# Patient Record
Sex: Female | Born: 1985 | Race: Black or African American | Hispanic: No | Marital: Single | Smoking: Never smoker
Health system: Southern US, Community
[De-identification: ages and names within clinical notes are randomized; demographics above are authoritative.]

## PROBLEM LIST (undated history)

## (undated) DIAGNOSIS — E669 Obesity, unspecified: Secondary | ICD-10-CM

## (undated) DIAGNOSIS — D649 Anemia, unspecified: Secondary | ICD-10-CM

## (undated) DIAGNOSIS — Q743 Arthrogryposis multiplex congenita: Secondary | ICD-10-CM

## (undated) DIAGNOSIS — B009 Herpesviral infection, unspecified: Secondary | ICD-10-CM

## (undated) DIAGNOSIS — E119 Type 2 diabetes mellitus without complications: Secondary | ICD-10-CM

## (undated) DIAGNOSIS — I82812 Embolism and thrombosis of superficial veins of left lower extremities: Secondary | ICD-10-CM

## (undated) DIAGNOSIS — E785 Hyperlipidemia, unspecified: Secondary | ICD-10-CM

## (undated) DIAGNOSIS — R5383 Other fatigue: Secondary | ICD-10-CM

## (undated) DIAGNOSIS — Z86018 Personal history of other benign neoplasm: Secondary | ICD-10-CM

## (undated) DIAGNOSIS — I10 Essential (primary) hypertension: Secondary | ICD-10-CM

## (undated) HISTORY — DX: Type 2 diabetes mellitus without complications: E11.9

## (undated) HISTORY — DX: Other fatigue: R53.83

## (undated) HISTORY — PX: OTHER SURGICAL HISTORY: SHX169

## (undated) HISTORY — DX: Obesity, unspecified: E66.9

## (undated) HISTORY — DX: Personal history of other benign neoplasm: Z86.018

## (undated) HISTORY — DX: Herpesviral infection, unspecified: B00.9

## (undated) HISTORY — DX: Anemia, unspecified: D64.9

## (undated) HISTORY — DX: Hyperlipidemia, unspecified: E78.5

## (undated) HISTORY — DX: Arthrogryposis multiplex congenita: Q74.3

## (undated) HISTORY — DX: Embolism and thrombosis of superficial veins of left lower extremity: I82.812

## (undated) HISTORY — PX: UTERINE FIBROID SURGERY: SHX826

## (undated) HISTORY — DX: Essential (primary) hypertension: I10

---

## 2008-06-18 ENCOUNTER — Emergency Department: Payer: Self-pay | Admitting: Emergency Medicine

## 2008-06-20 ENCOUNTER — Emergency Department: Payer: Self-pay | Admitting: Emergency Medicine

## 2008-06-22 ENCOUNTER — Emergency Department: Payer: Self-pay | Admitting: Emergency Medicine

## 2008-09-30 ENCOUNTER — Emergency Department: Payer: Self-pay | Admitting: Emergency Medicine

## 2010-11-03 ENCOUNTER — Ambulatory Visit: Payer: Self-pay | Admitting: Internal Medicine

## 2010-11-26 ENCOUNTER — Ambulatory Visit: Payer: Self-pay | Admitting: Internal Medicine

## 2013-12-02 LAB — HM PAP SMEAR: HM PAP: NEGATIVE

## 2014-12-08 ENCOUNTER — Encounter: Payer: Self-pay | Admitting: Obstetrics and Gynecology

## 2015-01-13 ENCOUNTER — Encounter: Payer: Self-pay | Admitting: Obstetrics and Gynecology

## 2015-02-17 ENCOUNTER — Encounter: Payer: Self-pay | Admitting: Obstetrics and Gynecology

## 2015-04-28 ENCOUNTER — Other Ambulatory Visit
Admission: RE | Admit: 2015-04-28 | Discharge: 2015-04-28 | Disposition: A | Discharge status: Home / Self Care | Payer: Medicaid Other | Source: Ambulatory Visit | Attending: Nurse Practitioner | Admitting: Nurse Practitioner

## 2015-04-28 DIAGNOSIS — I10 Essential (primary) hypertension: Secondary | ICD-10-CM | POA: Insufficient documentation

## 2015-04-28 DIAGNOSIS — E119 Type 2 diabetes mellitus without complications: Secondary | ICD-10-CM | POA: Insufficient documentation

## 2015-04-28 DIAGNOSIS — R5383 Other fatigue: Secondary | ICD-10-CM | POA: Insufficient documentation

## 2015-04-28 DIAGNOSIS — D509 Iron deficiency anemia, unspecified: Secondary | ICD-10-CM | POA: Insufficient documentation

## 2015-04-28 DIAGNOSIS — E785 Hyperlipidemia, unspecified: Secondary | ICD-10-CM | POA: Insufficient documentation

## 2015-04-28 LAB — COMPREHENSIVE METABOLIC PANEL
ALK PHOS: 63 U/L (ref 38–126)
ALT: 17 U/L (ref 14–54)
AST: 17 U/L (ref 15–41)
Albumin: 4.3 g/dL (ref 3.5–5.0)
Anion gap: 9 (ref 5–15)
BUN: 8 mg/dL (ref 6–20)
CHLORIDE: 108 mmol/L (ref 101–111)
CO2: 23 mmol/L (ref 22–32)
Calcium: 9.4 mg/dL (ref 8.9–10.3)
Creatinine, Ser: 0.36 mg/dL — ABNORMAL LOW (ref 0.44–1.00)
GLUCOSE: 114 mg/dL — AB (ref 65–99)
POTASSIUM: 3.6 mmol/L (ref 3.5–5.1)
Sodium: 140 mmol/L (ref 135–145)
Total Bilirubin: 0.4 mg/dL (ref 0.3–1.2)
Total Protein: 8.5 g/dL — ABNORMAL HIGH (ref 6.5–8.1)

## 2015-04-28 LAB — LIPID PANEL
CHOL/HDL RATIO: 4 ratio
Cholesterol: 166 mg/dL (ref 0–200)
HDL: 41 mg/dL (ref >40)
LDL CALC: 116 mg/dL — AB (ref 0–99)
Triglycerides: 43 mg/dL (ref <150)
VLDL: 9 mg/dL (ref 0–40)

## 2015-04-28 LAB — CBC
HCT: 38.1 % (ref 35.0–47.0)
HEMOGLOBIN: 11.9 g/dL — AB (ref 12.0–16.0)
MCH: 24.4 pg — AB (ref 26.0–34.0)
MCHC: 31.3 g/dL — AB (ref 32.0–36.0)
MCV: 78.1 fL — AB (ref 80.0–100.0)
PLATELETS: 406 10*3/uL (ref 150–440)
RBC: 4.87 MIL/uL (ref 3.80–5.20)
RDW: 17.6 % — ABNORMAL HIGH (ref 11.5–14.5)
WBC: 7.1 10*3/uL (ref 3.6–11.0)

## 2015-04-28 LAB — TSH: TSH: 1.681 u[IU]/mL (ref 0.350–4.500)

## 2015-04-29 ENCOUNTER — Other Ambulatory Visit: Payer: Self-pay | Admitting: Nurse Practitioner

## 2015-07-14 ENCOUNTER — Encounter: Payer: Self-pay | Admitting: Obstetrics and Gynecology

## 2015-07-14 ENCOUNTER — Ambulatory Visit (INDEPENDENT_AMBULATORY_CARE_PROVIDER_SITE_OTHER): Payer: Medicaid Other | Admitting: Obstetrics and Gynecology

## 2015-07-14 VITALS — BP 189/100 | HR 80 | Ht <= 58 in | Wt 191.4 lb

## 2015-07-14 DIAGNOSIS — N940 Mittelschmerz: Secondary | ICD-10-CM

## 2015-07-14 DIAGNOSIS — Z30011 Encounter for initial prescription of contraceptive pills: Secondary | ICD-10-CM | POA: Diagnosis not present

## 2015-07-14 DIAGNOSIS — I10 Essential (primary) hypertension: Secondary | ICD-10-CM

## 2015-07-14 DIAGNOSIS — E119 Type 2 diabetes mellitus without complications: Secondary | ICD-10-CM

## 2015-07-14 DIAGNOSIS — Z01419 Encounter for gynecological examination (general) (routine) without abnormal findings: Secondary | ICD-10-CM | POA: Diagnosis not present

## 2015-07-14 DIAGNOSIS — N920 Excessive and frequent menstruation with regular cycle: Secondary | ICD-10-CM | POA: Diagnosis not present

## 2015-07-14 MED ORDER — NORETHINDRONE 0.35 MG PO TABS: 1.0000 | ORAL_TABLET | Freq: Every day | ORAL | Status: DC

## 2015-07-14 NOTE — Progress Notes (Signed)
GYNECOLOGY ANNUAL PHYSICAL EXAM PROGRESS NOTE  Subjective:    Robin Vazquez is a 30 y.o. G0P0 female who presents for an annual exam.  The patient has never been sexually active. The patient wears seatbelts: yes. The patient participates in regular exercise: no. Has the patient ever been transfused or tattooed?: no. The patient reports that there is not domestic violence in her life.    The patient has complaints today:  1) Pelvic pain ~ 1 week after her menstrual cycle ends, lasts 1-2 days, is sharp, and occasionally has light spotting associated.  Manages with Tylenol. 2) Heavy menstrual cycles - notes heavy periods since onset of menses.  Uses super pads.  Cycles last 7 days.  Notes that she has just dealt with it because she assumed it was normal.    Gynecologic History Menarche age: 57 Patient's last menstrual period was 06/20/2015. Contraception: abstinence History of STI's: None Last Pap: 11/2013. Results were: normal.  Denies h/o abnormal pap smears.    OB History  Gravida Para Term Preterm AB SAB TAB Ectopic Multiple Living  0 0 0 0 0 0 0 0 0 0         Past Medical History  Diagnosis Date  . Anemia   . Hypertension   . Hyperlipemia   . Diabetes mellitus without complication ([place])   . Congenital multiple arthrogryposis   . Obesity     Past Surgical History  Procedure Laterality Date  . Joint      on legs and feet    Family History  Problem Relation Age of Onset  . Adopted: Yes  . Family history unknown: Yes    Social History   Social History  . Marital Status: Single    Spouse Name: N/A  . Number of Children: N/A  . Years of Education: N/A   Occupational History  . Not on file.   Social History Main Topics  . Smoking status: Never Smoker   . Smokeless tobacco: Not on file  . Alcohol Use: No  . Drug Use: No  . Sexual Activity: No   Other Topics Concern  . Not on file   Social History Narrative    Current Outpatient Prescriptions on  File Prior to Visit  Medication Sig Dispense Refill  . enalapril (VASOTEC) 5 MG tablet Take 5 mg by mouth daily.    Marland Kitchen glipiZIDE (GLUCOTROL) 10 MG tablet Take 10 mg by mouth daily before breakfast.    . phentermine 37.5 MG capsule Take 37.5 mg by mouth every morning.     No current facility-administered medications on file prior to visit.    No Known Allergies    Review of Systems Constitutional: negative for chills, fatigue, fevers and sweats Eyes: negative for irritation, redness and visual disturbance Ears, nose, mouth, throat, and face: negative for hearing loss, nasal congestion, snoring and tinnitus Respiratory: negative for asthma, cough, sputum Cardiovascular: negative for chest pain, dyspnea, exertional chest pressure/discomfort, irregular heart beat, palpitations and syncope Gastrointestinal: negative for abdominal pain, change in bowel habits, nausea and vomiting Genitourinary: positive for heavy menstrual periods, pelvic pain mid cycle.  Negative for genital lesions, sexual problems and vaginal discharge, dysuria and urinary incontinence Integument/breast: negative for breast lump, breast tenderness and nipple discharge Hematologic/lymphatic: negative for bleeding and easy bruising Musculoskeletal: negative for back pain and muscle weakness Neurologic: negative for dizziness, headaches, vertigo and weakness Endocrine: negative for diabetic symptoms including polydipsia, polyuria and skin dryness Allergic/Immunologic: negative for hay fever and  urticaria      Objective:  Blood pressure 189/100, pulse 80, height 4\' 9"  (1.448 m), weight 191 lb 6.4 oz (86.818 kg), last menstrual period 06/20/2015. Body mass index is 41.41 kg/(m^2).  General Appearance:    Alert, cooperative, no distress, appears stated age  Head:    Normocephalic, without obvious abnormality, atraumatic  Eyes:    PERRL, conjunctiva/corneas clear, EOM's intact, both eyes  Ears:    Normal external ear canals,  both ears  Nose:   Nares normal, septum midline, mucosa normal, no drainage or sinus tenderness  Throat:   Lips, mucosa, and tongue normal; teeth and gums normal  Neck:   Supple, symmetrical, trachea midline, no adenopathy; thyroid: no enlargement/tenderness/nodules; no carotid bruit or JVD  Back:     Symmetric, no curvature, ROM normal, no CVA tenderness  Lungs:     Clear to auscultation bilaterally, respirations unlabored  Chest Wall:    No tenderness or deformity   Heart:    Regular rate and rhythm, S1 and S2 normal, no murmur, rub or gallop  Breast Exam:    No tenderness, masses, or nipple abnormality  Abdomen:     Soft, non-tender, bowel sounds active all four quadrants, no masses, no organomegaly.    Genitalia:  External genitalia Vulva:- Normal. Bartholin's Gland- Bilateral- Normal. Perineum- Normal. Clitoris- Normal. Introitus:Characteristics- Normal(However very narrow introitus with prominent pubic arch.). Discharge- None. Urethra:Characteristics- Normal. Speculum & Bimanual Vagina:No lesions. Vaginal Mucosa- No Atrophy, Relaxation or Secretions. Note: Vaginal mucosa difficult to assess as patient very tense during exam and use of pediatric speculum suboptimal against body habitus. Cervix:Characteristics- Normal(Cervix difficult to assess as patient very tense during exam and use of pediatric speculum suboptimal against body habitus.). Uterus:Characteristics- Normal size, shape and consistency (Uterus difficult to assess as patient very tense during exam and use of single digit bimanual exam with body habitus.). Position- Midposition. Adnexa:Characteristics- Bilateral- Normal. Note: Uterus difficult to assess as patient very tense during exam and use of single digit bimanual e  Rectal:    Normal external sphincter.  No hemorrhoids appreciated. Internal exam not done.   Extremities:   Joint contraction of knees and elbows bilaterally, shortened upper and lower  limbs. Atraumatic, no cyanosis or edema  Pulses:   2+ and symmetric all extremities  Skin:   Skin color, texture, turgor normal, several dry scaly patches of skin noted on lower extremities  Lymph nodes:   Cervical, supraclavicular, and axillary nodes normal  Neurologic:   CNII-XII intact, normal strength, sensation and reflexes throughout    Labs:  Lab Results  Component Value Date   WBC 7.1 04/28/2015   HGB 11.9* 04/28/2015   HCT 38.1 04/28/2015   MCV 78.1* 04/28/2015   PLT 406 04/28/2015    Lab Results  Component Value Date   CREATININE 0.36* 04/28/2015   BUN 8 04/28/2015   NA 140 04/28/2015   K 3.6 04/28/2015   CL 108 04/28/2015   CO2 23 04/28/2015    Lab Results  Component Value Date   ALT 17 04/28/2015   AST 17 04/28/2015   ALKPHOS 63 04/28/2015   BILITOT 0.4 04/28/2015    Lab Results  Component Value Date   TSH 1.681 04/28/2015    Results for SHANEECE, INSKO (MRN [id number]) as of 07/14/2015 19:40  Ref. Range 04/28/2015 12:37  Glucose Latest Ref Range: 65-99 mg/dL 114 (H)   Assessment:   Healthy female exam.   HTN DM Congenital arthogryposis Menorrhagia Mittelschmerz (pelvic pain)  Plan:     All questions answered. Breast self exam technique reviewed and patient encouraged to perform self-exam monthly. Discussed healthy lifestyle modifications. Pap smear up to date.  Next pap smear due in 11/2018.  HTN uncontrolled.  Patient notes that BPs are usually not this high at other clinic visits.  Was nervous about pelvic exam today.  Notes complaince with medications.  DM - currently controlled on medications (per patient).  Seen by PCP in December.  Contraception: patient not sexually active, however notes heavy menstrual cycles with Mittleschmerz pain.  Desires to try OCPs for management. Will prescribe progesterone-only OCPs due to patient's BP.   To return to clinic in 2 months to f/u contraception and management of menstrual cycles.    Rubie Maid,  MD [facility]

## 2015-07-14 NOTE — Patient Instructions (Signed)
Health Maintenance, Female Adopting a healthy lifestyle and getting preventive care can go a long way to promote health and wellness. Talk with your health care provider about what schedule of regular examinations is right for you. This is a good chance for you to check in with your provider about disease prevention and staying healthy. In between checkups, there are plenty of things you can do on your own. Experts have done a lot of research about which lifestyle changes and preventive measures are most likely to keep you healthy. Ask your health care provider for more information. WEIGHT AND DIET  Eat a healthy diet  Be sure to include plenty of vegetables, fruits, low-fat dairy products, and lean protein.  Do not eat a lot of foods high in solid fats, added sugars, or salt.  Get regular exercise. This is one of the most important things you can do for your health.  Most adults should exercise for at least 150 minutes each week. The exercise should increase your heart rate and make you sweat (moderate-intensity exercise).  Most adults should also do strengthening exercises at least twice a week. This is in addition to the moderate-intensity exercise.  Maintain a healthy weight  Body mass index (BMI) is a measurement that can be used to identify possible weight problems. It estimates body fat based on height and weight. Your health care provider can help determine your BMI and help you achieve or maintain a healthy weight.  For females 20 years of age and older:   A BMI below 18.5 is considered underweight.  A BMI of 18.5 to 24.9 is normal.  A BMI of 25 to 29.9 is considered overweight.  A BMI of 30 and above is considered obese.  Watch levels of cholesterol and blood lipids  You should start having your blood tested for lipids and cholesterol at 32 years of age, then have this test every 5 years.  You may need to have your cholesterol levels checked more often if:  Your lipid  or cholesterol levels are high.  You are older than 32 years of age.  You are at high risk for heart disease.  CANCER SCREENING   Lung Cancer  Lung cancer screening is recommended for adults 55-80 years old who are at high risk for lung cancer because of a history of smoking.  A yearly low-dose CT scan of the lungs is recommended for people who:  Currently smoke.  Have quit within the past 15 years.  Have at least a 30-pack-year history of smoking. A pack year is smoking an average of one pack of cigarettes a day for 1 year.  Yearly screening should continue until it has been 15 years since you quit.  Yearly screening should stop if you develop a health problem that would prevent you from having lung cancer treatment.  Breast Cancer  Practice breast self-awareness. This means understanding how your breasts normally appear and feel.  It also means doing regular breast self-exams. Let your health care provider know about any changes, no matter how small.  If you are in your 20s or 30s, you should have a clinical breast exam (CBE) by a health care provider every 1-3 years as part of a regular health exam.  If you are 40 or older, have a CBE every year. Also consider having a breast X-ray (mammogram) every year.  If you have a family history of breast cancer, talk to your health care provider about genetic screening.  If you   are at high risk for breast cancer, talk to your health care provider about having an MRI and a mammogram every year.  Breast cancer gene (BRCA) assessment is recommended for women who have family members with BRCA-related cancers. BRCA-related cancers include:  Breast.  Ovarian.  Tubal.  Peritoneal cancers.  Results of the assessment will determine the need for genetic counseling and BRCA1 and BRCA2 testing. Cervical Cancer Your health care provider may recommend that you be screened regularly for cancer of the pelvic organs (ovaries, uterus, and  vagina). This screening involves a pelvic examination, including checking for microscopic changes to the surface of your cervix (Pap test). You may be encouraged to have this screening done every 3 years, beginning at age 21.  For women ages 30-65, health care providers may recommend pelvic exams and Pap testing every 3 years, or they may recommend the Pap and pelvic exam, combined with testing for human papilloma virus (HPV), every 5 years. Some types of HPV increase your risk of cervical cancer. Testing for HPV may also be done on women of any age with unclear Pap test results.  Other health care providers may not recommend any screening for nonpregnant women who are considered low risk for pelvic cancer and who do not have symptoms. Ask your health care provider if a screening pelvic exam is right for you.  If you have had past treatment for cervical cancer or a condition that could lead to cancer, you need Pap tests and screening for cancer for at least 20 years after your treatment. If Pap tests have been discontinued, your risk factors (such as having a new sexual partner) need to be reassessed to determine if screening should resume. Some women have medical problems that increase the chance of getting cervical cancer. In these cases, your health care provider may recommend more frequent screening and Pap tests. Colorectal Cancer  This type of cancer can be detected and often prevented.  Routine colorectal cancer screening usually begins at 32 years of age and continues through 32 years of age.  Your health care provider may recommend screening at an earlier age if you have risk factors for colon cancer.  Your health care provider may also recommend using home test kits to check for hidden blood in the stool.  A small camera at the end of a tube can be used to examine your colon directly (sigmoidoscopy or colonoscopy). This is done to check for the earliest forms of colorectal  cancer.  Routine screening usually begins at age 50.  Direct examination of the colon should be repeated every 5-10 years through 32 years of age. However, you may need to be screened more often if early forms of precancerous polyps or small growths are found. Skin Cancer  Check your skin from head to toe regularly.  Tell your health care provider about any new moles or changes in moles, especially if there is a change in a mole's shape or color.  Also tell your health care provider if you have a mole that is larger than the size of a pencil eraser.  Always use sunscreen. Apply sunscreen liberally and repeatedly throughout the day.  Protect yourself by wearing long sleeves, pants, a wide-brimmed hat, and sunglasses whenever you are outside. HEART DISEASE, DIABETES, AND HIGH BLOOD PRESSURE   High blood pressure causes heart disease and increases the risk of stroke. High blood pressure is more likely to develop in:  People who have blood pressure in the high end   of the normal range (130-139/85-89 mm Hg).  People who are overweight or obese.  People who are African American.  If you are 38-23 years of age, have your blood pressure checked every 3-5 years. If you are 61 years of age or older, have your blood pressure checked every year. You should have your blood pressure measured twice--once when you are at a hospital or clinic, and once when you are not at a hospital or clinic. Record the average of the two measurements. To check your blood pressure when you are not at a hospital or clinic, you can use:  An automated blood pressure machine at a pharmacy.  A home blood pressure monitor.  If you are between 45 years and 39 years old, ask your health care provider if you should take aspirin to prevent strokes.  Have regular diabetes screenings. This involves taking a blood sample to check your fasting blood sugar level.  If you are at a normal weight and have a low risk for diabetes,  have this test once every three years after 32 years of age.  If you are overweight and have a high risk for diabetes, consider being tested at a younger age or more often. PREVENTING INFECTION  Hepatitis B  If you have a higher risk for hepatitis B, you should be screened for this virus. You are considered at high risk for hepatitis B if:  You were born in a country where hepatitis B is common. Ask your health care provider which countries are considered high risk.  Your parents were born in a high-risk country, and you have not been immunized against hepatitis B (hepatitis B vaccine).  You have HIV or AIDS.  You use needles to inject street drugs.  You live with someone who has hepatitis B.  You have had sex with someone who has hepatitis B.  You get hemodialysis treatment.  You take certain medicines for conditions, including cancer, organ transplantation, and autoimmune conditions. Hepatitis C  Blood testing is recommended for:  Everyone born from [birth year] through 1967.  Anyone with known risk factors for hepatitis C. Sexually transmitted infections (STIs)  You should be screened for sexually transmitted infections (STIs) including gonorrhea and chlamydia if:  You are sexually active and are younger than 32 years of age.  You are older than 32 years of age and your health care provider tells you that you are at risk for this type of infection.  Your sexual activity has changed since you were last screened and you are at an increased risk for chlamydia or gonorrhea. Ask your health care provider if you are at risk.  If you do not have HIV, but are at risk, it may be recommended that you take a prescription medicine daily to prevent HIV infection. This is called pre-exposure prophylaxis (PrEP). You are considered at risk if:  You are sexually active and do not regularly use condoms or know the HIV status of your partner(s).  You take drugs by injection.  You are sexually  active with a partner who has HIV. Talk with your health care provider about whether you are at high risk of being infected with HIV. If you choose to begin PrEP, you should first be tested for HIV. You should then be tested every 3 months for as long as you are taking PrEP.  PREGNANCY   If you are premenopausal and you may become pregnant, ask your health care provider about preconception counseling.  If you may  become pregnant, take 400 to 800 micrograms (mcg) of folic acid every day.  If you want to prevent pregnancy, talk to your health care provider about birth control (contraception). OSTEOPOROSIS AND MENOPAUSE   Osteoporosis is a disease in which the bones lose minerals and strength with aging. This can result in serious bone fractures. Your risk for osteoporosis can be identified using a bone density scan.  If you are 61 years of age or older, or if you are at risk for osteoporosis and fractures, ask your health care provider if you should be screened.  Ask your health care provider whether you should take a calcium or vitamin D supplement to lower your risk for osteoporosis.  Menopause may have certain physical symptoms and risks.  Hormone replacement therapy may reduce some of these symptoms and risks. Talk to your health care provider about whether hormone replacement therapy is right for you.  HOME CARE INSTRUCTIONS   Schedule regular health, dental, and eye exams.  Stay current with your immunizations.   Do not use any tobacco products including cigarettes, chewing tobacco, or electronic cigarettes.  If you are pregnant, do not drink alcohol.  If you are breastfeeding, limit how much and how often you drink alcohol.  Limit alcohol intake to no more than 1 drink per day for nonpregnant women. One drink equals 12 ounces of beer, 5 ounces of wine, or 1 ounces of hard liquor.  Do not use street drugs.  Do not share needles.  Ask your health care provider for help if  you need support or information about quitting drugs.  Tell your health care provider if you often feel depressed.  Tell your health care provider if you have ever been abused or do not feel safe at home.   This information is not intended to replace advice given to you by your health care provider. Make sure you discuss any questions you have with your health care provider.   Document Released: 11/27/2010 Document Revised: 06/04/2014 Document Reviewed: 04/15/2013 Elsevier Interactive Patient Education [employer].

## 2015-07-28 ENCOUNTER — Other Ambulatory Visit
Admission: RE | Admit: 2015-07-28 | Discharge: 2015-07-28 | Disposition: A | Discharge status: Home / Self Care | Payer: Medicaid Other | Source: Ambulatory Visit | Attending: Nurse Practitioner | Admitting: Nurse Practitioner

## 2015-07-28 DIAGNOSIS — E785 Hyperlipidemia, unspecified: Secondary | ICD-10-CM | POA: Diagnosis present

## 2015-07-28 DIAGNOSIS — E119 Type 2 diabetes mellitus without complications: Secondary | ICD-10-CM | POA: Diagnosis present

## 2015-07-28 DIAGNOSIS — I10 Essential (primary) hypertension: Secondary | ICD-10-CM | POA: Diagnosis present

## 2015-07-28 DIAGNOSIS — D509 Iron deficiency anemia, unspecified: Secondary | ICD-10-CM | POA: Diagnosis present

## 2015-07-28 LAB — COMPREHENSIVE METABOLIC PANEL
ALBUMIN: 4.4 g/dL (ref 3.5–5.0)
ALT: 15 U/L (ref 14–54)
AST: 16 U/L (ref 15–41)
Alkaline Phosphatase: 70 U/L (ref 38–126)
Anion gap: 8 (ref 5–15)
BILIRUBIN TOTAL: 0.4 mg/dL (ref 0.3–1.2)
BUN: 6 mg/dL (ref 6–20)
CHLORIDE: 106 mmol/L (ref 101–111)
CO2: 26 mmol/L (ref 22–32)
CREATININE: 0.42 mg/dL — AB (ref 0.44–1.00)
Calcium: 9.2 mg/dL (ref 8.9–10.3)
GFR calc Af Amer: >60 mL/min (ref >60)
GLUCOSE: 105 mg/dL — AB (ref 65–99)
Potassium: 3.7 mmol/L (ref 3.5–5.1)
Sodium: 140 mmol/L (ref 135–145)
Total Protein: 8.4 g/dL — ABNORMAL HIGH (ref 6.5–8.1)

## 2015-07-28 LAB — LIPID PANEL
Cholesterol: 137 mg/dL (ref 0–200)
HDL: 42 mg/dL (ref >40)
LDL CALC: 86 mg/dL (ref 0–99)
Total CHOL/HDL Ratio: 3.3 RATIO
Triglycerides: 44 mg/dL (ref <150)
VLDL: 9 mg/dL (ref 0–40)

## 2015-07-28 LAB — CBC
HEMATOCRIT: 36.7 % (ref 35.0–47.0)
Hemoglobin: 12 g/dL (ref 12.0–16.0)
MCH: 25.9 pg — ABNORMAL LOW (ref 26.0–34.0)
MCHC: 32.7 g/dL (ref 32.0–36.0)
MCV: 79.4 fL — AB (ref 80.0–100.0)
PLATELETS: 436 10*3/uL (ref 150–440)
RBC: 4.62 MIL/uL (ref 3.80–5.20)
RDW: 15.4 % — ABNORMAL HIGH (ref 11.5–14.5)
WBC: 8.7 10*3/uL (ref 3.6–11.0)

## 2015-07-28 LAB — HEMOGLOBIN A1C: Hgb A1c MFr Bld: 6.2 % — ABNORMAL HIGH (ref 4.0–6.0)

## 2015-07-28 LAB — TSH: TSH: 1.72 u[IU]/mL (ref 0.350–4.500)

## 2015-08-17 ENCOUNTER — Telehealth: Payer: Self-pay | Admitting: Obstetrics and Gynecology

## 2015-08-17 NOTE — Telephone Encounter (Signed)
JUST STARTED BC 3 WKS AGO AND STARTED HER FIRSR PERIOD AND SHE IS BLEEDING SINCE 3/13. IS THIS NORMAL?

## 2015-08-18 NOTE — Telephone Encounter (Signed)
Pt states she is having some irregular bleeding since beginning OCPs. Advised pt that it is normal to have some irregular bleeding for the first 3 months after initiating OCPs. Pt states bleeding is very light and not painful. Advised on heavy bleeding precautions. Pt to f/u as scheduled for 09/13/15.

## 2015-09-13 ENCOUNTER — Ambulatory Visit: Payer: Medicaid Other | Admitting: Obstetrics and Gynecology

## 2015-09-26 DIAGNOSIS — Z86018 Personal history of other benign neoplasm: Secondary | ICD-10-CM

## 2015-09-26 HISTORY — DX: Personal history of other benign neoplasm: Z86.018

## 2015-09-28 ENCOUNTER — Ambulatory Visit: Payer: Medicaid Other | Admitting: Obstetrics and Gynecology

## 2015-10-26 ENCOUNTER — Ambulatory Visit: Payer: Medicaid Other | Admitting: Obstetrics and Gynecology

## 2015-11-23 ENCOUNTER — Telehealth: Payer: Self-pay | Admitting: *Deleted

## 2015-11-23 NOTE — Telephone Encounter (Signed)
Patient called and stated that she is having she concerns about her birth control. She states she didn't have a cycle this month. She did spot on 11/13/15. Her last cycle 09/28/15-10/09/15. She did mention that she switched her times of taking her birth control from night to evening . She was wondering if that could of caused it. Patient is requesting a call back. Her number is [phone].

## 2015-11-24 NOTE — Telephone Encounter (Signed)
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 Pt was placed on this OCP due to heavy and painful periods. Pt states that her periods are no longer heavy, however her periods are lasting much longer typically 2 weeks. This month she has not began bleeding yet (cycle should have started beginning of June) pt states she is very crampy but there is no flow. Please advise.

## 2015-11-29 NOTE — Telephone Encounter (Signed)
Please inform patient that she would likely benefit from an alternative OCP containing estrogen but due to her uncontrolled HTN she is not a candidate at this time. Could consider other progestin type contraceptives, such as depo Provera, or Nexplanon.  The progestin only forms of birth control can sometimes cause irregular periods.

## 2015-12-02 NOTE — Telephone Encounter (Signed)
Pt states she will stay on the bcp she is on.

## 2016-01-17 ENCOUNTER — Other Ambulatory Visit
Admission: RE | Admit: 2016-01-17 | Discharge: 2016-01-17 | Disposition: A | Discharge status: Home / Self Care | Payer: Medicaid Other | Source: Ambulatory Visit | Attending: Nurse Practitioner | Admitting: Nurse Practitioner

## 2016-01-17 DIAGNOSIS — E785 Hyperlipidemia, unspecified: Secondary | ICD-10-CM | POA: Insufficient documentation

## 2016-01-17 DIAGNOSIS — E119 Type 2 diabetes mellitus without complications: Secondary | ICD-10-CM | POA: Insufficient documentation

## 2016-01-17 DIAGNOSIS — D509 Iron deficiency anemia, unspecified: Secondary | ICD-10-CM | POA: Diagnosis not present

## 2016-01-17 LAB — CBC WITH DIFFERENTIAL/PLATELET
BASOS ABS: 0 10*3/uL (ref 0–0.1)
BASOS PCT: 1 %
Eosinophils Absolute: 0.1 10*3/uL (ref 0–0.7)
Eosinophils Relative: 3 %
HEMATOCRIT: 37.9 % (ref 35.0–47.0)
HEMOGLOBIN: 12.5 g/dL (ref 12.0–16.0)
Lymphocytes Relative: 33 %
Lymphs Abs: 1.9 10*3/uL (ref 1.0–3.6)
MCH: 25.5 pg — ABNORMAL LOW (ref 26.0–34.0)
MCHC: 33.1 g/dL (ref 32.0–36.0)
MCV: 76.8 fL — ABNORMAL LOW (ref 80.0–100.0)
Monocytes Absolute: 0.3 10*3/uL (ref 0.2–0.9)
Monocytes Relative: 6 %
NEUTROS ABS: 3.3 10*3/uL (ref 1.4–6.5)
NEUTROS PCT: 57 %
Platelets: 362 10*3/uL (ref 150–440)
RBC: 4.93 MIL/uL (ref 3.80–5.20)
RDW: 17.3 % — ABNORMAL HIGH (ref 11.5–14.5)
WBC: 5.8 10*3/uL (ref 3.6–11.0)

## 2016-01-17 LAB — LIPID PANEL
CHOL/HDL RATIO: 4.3 ratio
Cholesterol: 136 mg/dL (ref 0–200)
HDL: 32 mg/dL — ABNORMAL LOW (ref >40)
LDL CALC: 96 mg/dL (ref 0–99)
TRIGLYCERIDES: 41 mg/dL (ref <150)
VLDL: 8 mg/dL (ref 0–40)

## 2016-01-17 LAB — COMPREHENSIVE METABOLIC PANEL
ALBUMIN: 4.1 g/dL (ref 3.5–5.0)
ALK PHOS: 56 U/L (ref 38–126)
ALT: 17 U/L (ref 14–54)
AST: 16 U/L (ref 15–41)
Anion gap: 6 (ref 5–15)
BILIRUBIN TOTAL: 0.5 mg/dL (ref 0.3–1.2)
BUN: 9 mg/dL (ref 6–20)
CALCIUM: 8.9 mg/dL (ref 8.9–10.3)
CO2: 22 mmol/L (ref 22–32)
Chloride: 107 mmol/L (ref 101–111)
Creatinine, Ser: 0.31 mg/dL — ABNORMAL LOW (ref 0.44–1.00)
GFR calc Af Amer: >60 mL/min (ref >60)
GFR calc non Af Amer: >60 mL/min (ref >60)
GLUCOSE: 132 mg/dL — AB (ref 65–99)
Potassium: 3.7 mmol/L (ref 3.5–5.1)
Sodium: 135 mmol/L (ref 135–145)
TOTAL PROTEIN: 7.4 g/dL (ref 6.5–8.1)

## 2016-01-17 LAB — TSH: TSH: 2.459 u[IU]/mL (ref 0.350–4.500)

## 2016-01-17 LAB — HEMOGLOBIN A1C: Hgb A1c MFr Bld: 6.3 % — ABNORMAL HIGH (ref 4.0–6.0)

## 2016-02-09 ENCOUNTER — Telehealth: Payer: Self-pay | Admitting: Obstetrics and Gynecology

## 2016-02-09 NOTE — Telephone Encounter (Signed)
Patient called complaining of abnormal bleeding on birth control. She bled almost the entire month of August and she is already anemic and taking iron. She would like a call back. Thanks

## 2016-02-09 NOTE — Telephone Encounter (Signed)
Called pt she states she is continuing to have break through bleeding on OCPs, bleeding x 3weeks at a time. Advised pt of the need for appt.

## 2016-02-09 NOTE — Telephone Encounter (Signed)
Pt scheduled for 03/01/2016

## 2016-02-26 DIAGNOSIS — I82812 Embolism and thrombosis of superficial veins of left lower extremities: Secondary | ICD-10-CM

## 2016-02-26 HISTORY — DX: Embolism and thrombosis of superficial veins of left lower extremity: I82.812

## 2016-03-01 ENCOUNTER — Encounter: Payer: Self-pay | Admitting: Obstetrics and Gynecology

## 2016-03-01 ENCOUNTER — Ambulatory Visit (INDEPENDENT_AMBULATORY_CARE_PROVIDER_SITE_OTHER): Payer: Medicaid Other | Admitting: Obstetrics and Gynecology

## 2016-03-01 VITALS — BP 139/82 | Wt 196.7 lb

## 2016-03-01 DIAGNOSIS — N921 Excessive and frequent menstruation with irregular cycle: Secondary | ICD-10-CM

## 2016-03-01 DIAGNOSIS — I10 Essential (primary) hypertension: Secondary | ICD-10-CM | POA: Diagnosis not present

## 2016-03-01 DIAGNOSIS — N920 Excessive and frequent menstruation with regular cycle: Secondary | ICD-10-CM | POA: Diagnosis not present

## 2016-03-01 NOTE — Progress Notes (Signed)
    GYNECOLOGY PROGRESS NOTE  Subjective:    Patient ID: Robin Vazquez, female    DOB: [birth year], 30 y.o.   MRN: [id number]  HPI  Patient is a 30 y.o. G0P0000 female who presents for complaints of breakthrough bleeding on progesterone OCPs. Has been on OCPs for approximately 8 months to help with heavy bleeding and ovulation pain. Notes that her cycles sometimes last you to 15 days, with 2-3 days of intermenstrual spotting.    The following portions of the patient's history were reviewed and updated as appropriate: allergies, current medications, past family history, past medical history, past social history, past surgical history and problem list.  Review of Systems Pertinent items noted in HPI and remainder of comprehensive ROS otherwise negative.   Objective:   Blood pressure 139/82, weight 196 lb 11.2 oz (89.2 kg). General appearance: alert and no distress Abdomen: soft, non-tender; bowel sounds normal; no masses,  no organomegaly Pelvic: deferred    Assessment:   Menorrhagia Breakthrough bleeding on OCPs cHTN  Plan:   - Patient originally placed on progesterone OCPs due to uncontrolled HTN (BPs were 180s/100s at that visit).  Patient has since been followed by her PCP and BPs are much better controlled.  Can now switch to a low dose combined OCP.  Given sample.  To call back in 1 month if medication works so that prescription can be sent.  If still noting breakthrough or irregular bleeding, can schedule appointment for futher discussion of management options.    Rubie Maid, MD [facility]

## 2016-03-16 DIAGNOSIS — Z86718 Personal history of other venous thrombosis and embolism: Secondary | ICD-10-CM | POA: Insufficient documentation

## 2016-03-20 ENCOUNTER — Telehealth: Payer: Self-pay | Admitting: Obstetrics and Gynecology

## 2016-03-20 NOTE — Telephone Encounter (Signed)
Patient calls stating she was diagnosed with a left femur superficial blood clot and has been advised to discontinue birth control pills. She has questions regarding timing of discontinuation of pills and requests a call back.

## 2016-03-20 NOTE — Telephone Encounter (Signed)
Patient should discontinue the sample combined OCPs that were given at her last visit immediately.  As she noted that the previously prescribed progesterone-only pills were causing her to have irregular bleeding, she will need to follow up in clinic to discuss other contraceptive options.

## 2016-03-21 NOTE — Telephone Encounter (Signed)
Called pt no answer, LM for pt informing her to d/c OCPs immediately and call clinic back to set up an appointment to discuss other options.

## 2016-05-08 ENCOUNTER — Other Ambulatory Visit
Admission: RE | Admit: 2016-05-08 | Discharge: 2016-05-08 | Disposition: A | Discharge status: Home / Self Care | Payer: Medicaid Other | Source: Ambulatory Visit | Attending: Nurse Practitioner | Admitting: Nurse Practitioner

## 2016-05-08 ENCOUNTER — Inpatient Hospital Stay: Admit: 2016-05-08 | Payer: Self-pay

## 2016-05-09 ENCOUNTER — Other Ambulatory Visit
Admission: RE | Admit: 2016-05-09 | Discharge: 2016-05-09 | Disposition: A | Discharge status: Home / Self Care | Payer: Medicaid Other | Source: Ambulatory Visit | Attending: Nurse Practitioner | Admitting: Nurse Practitioner

## 2016-05-09 DIAGNOSIS — D509 Iron deficiency anemia, unspecified: Secondary | ICD-10-CM | POA: Insufficient documentation

## 2016-05-09 DIAGNOSIS — E119 Type 2 diabetes mellitus without complications: Secondary | ICD-10-CM | POA: Insufficient documentation

## 2016-05-09 DIAGNOSIS — E669 Obesity, unspecified: Secondary | ICD-10-CM | POA: Insufficient documentation

## 2016-05-09 LAB — COMPREHENSIVE METABOLIC PANEL
ALBUMIN: 4.3 g/dL (ref 3.5–5.0)
ALT: 16 U/L (ref 14–54)
AST: 17 U/L (ref 15–41)
Alkaline Phosphatase: 57 U/L (ref 38–126)
Anion gap: 8 (ref 5–15)
BUN: 11 mg/dL (ref 6–20)
CHLORIDE: 106 mmol/L (ref 101–111)
CO2: 21 mmol/L — ABNORMAL LOW (ref 22–32)
Calcium: 8.8 mg/dL — ABNORMAL LOW (ref 8.9–10.3)
Creatinine, Ser: 0.37 mg/dL — ABNORMAL LOW (ref 0.44–1.00)
GFR calc Af Amer: >60 mL/min (ref >60)
Glucose, Bld: 153 mg/dL — ABNORMAL HIGH (ref 65–99)
POTASSIUM: 3.7 mmol/L (ref 3.5–5.1)
Sodium: 135 mmol/L (ref 135–145)
Total Bilirubin: 0.4 mg/dL (ref 0.3–1.2)
Total Protein: 7.9 g/dL (ref 6.5–8.1)

## 2016-05-09 LAB — CBC
HCT: 35.7 % (ref 35.0–47.0)
Hemoglobin: 12 g/dL (ref 12.0–16.0)
MCH: 26.6 pg (ref 26.0–34.0)
MCHC: 33.7 g/dL (ref 32.0–36.0)
MCV: 78.9 fL — AB (ref 80.0–100.0)
PLATELETS: 371 10*3/uL (ref 150–440)
RBC: 4.52 MIL/uL (ref 3.80–5.20)
RDW: 16.1 % — AB (ref 11.5–14.5)
WBC: 6.4 10*3/uL (ref 3.6–11.0)

## 2016-05-09 LAB — LIPID PANEL
CHOL/HDL RATIO: 3.7 ratio
CHOLESTEROL: 149 mg/dL (ref 0–200)
HDL: 40 mg/dL — ABNORMAL LOW (ref >40)
LDL Cholesterol: 102 mg/dL — ABNORMAL HIGH (ref 0–99)
Triglycerides: 36 mg/dL (ref <150)
VLDL: 7 mg/dL (ref 0–40)

## 2016-05-10 LAB — HEMOGLOBIN A1C
HEMOGLOBIN A1C: 6.5 % — AB (ref 4.8–5.6)
MEAN PLASMA GLUCOSE: 140 mg/dL

## 2016-07-17 ENCOUNTER — Encounter: Payer: Self-pay | Admitting: Obstetrics and Gynecology

## 2016-07-17 ENCOUNTER — Ambulatory Visit (INDEPENDENT_AMBULATORY_CARE_PROVIDER_SITE_OTHER): Payer: Medicaid Other | Admitting: Obstetrics and Gynecology

## 2016-07-17 VITALS — BP 170/108 | HR 105 | Wt 195.8 lb

## 2016-07-17 DIAGNOSIS — N852 Hypertrophy of uterus: Secondary | ICD-10-CM

## 2016-07-17 DIAGNOSIS — Z86718 Personal history of other venous thrombosis and embolism: Secondary | ICD-10-CM | POA: Diagnosis not present

## 2016-07-17 DIAGNOSIS — Z01411 Encounter for gynecological examination (general) (routine) with abnormal findings: Secondary | ICD-10-CM | POA: Diagnosis not present

## 2016-07-17 DIAGNOSIS — Z113 Encounter for screening for infections with a predominantly sexual mode of transmission: Secondary | ICD-10-CM | POA: Diagnosis not present

## 2016-07-17 DIAGNOSIS — N920 Excessive and frequent menstruation with regular cycle: Secondary | ICD-10-CM

## 2016-07-17 DIAGNOSIS — Z862 Personal history of diseases of the blood and blood-forming organs and certain disorders involving the immune mechanism: Secondary | ICD-10-CM | POA: Diagnosis not present

## 2016-07-17 DIAGNOSIS — I10 Essential (primary) hypertension: Secondary | ICD-10-CM | POA: Diagnosis not present

## 2016-07-17 DIAGNOSIS — E119 Type 2 diabetes mellitus without complications: Secondary | ICD-10-CM | POA: Diagnosis not present

## 2016-07-17 NOTE — Progress Notes (Signed)
GYNECOLOGY ANNUAL PHYSICAL EXAM PROGRESS NOTE  Subjective:    Robin Vazquez is a 31 y.o. G0P0 female who presents for an annual exam.  The patient has recently become sexually active (coitarche last month), 1 partner. The patient wears seatbelts: yes. The patient participates in regular exercise: no. Has the patient ever been transfused or tattooed?: no. The patient reports that there is not domestic violence in her life.    The patient has complaints today:  1)  Still noting somewhat heavy menstrual cycles.  Was on progesterone pills which initially helped, but then made bleeding heavier with intermenstrual spotting.  Patient then discontinued the pills approximately 4 months ago due to symptoms and was started on a trial of low dose combined OCPs.  Had to discontinue within that same month as she was diagnosed with a DVT.     Gynecologic History Menarche age: 52 Patient's last menstrual period was 07/02/2016 (exact date). Contraception: abstinence History of STI's: None Last Pap: 11/2013. Results were: normal.  Denies h/o abnormal pap smears.    OB History  Gravida Para Term Preterm AB Living  0 0 0 0 0 0  SAB TAB Ectopic Multiple Live Births  0 0 0 0          Past Medical History:  Diagnosis Date  . Acute superficial venous thrombosis of left lower extremity 02/2016  . Anemia   . Congenital multiple arthrogryposis   . Diabetes mellitus without complication ([place])   . Hyperlipemia   . Hypertension   . Obesity     Past Surgical History:  Procedure Laterality Date  . joint     on legs and feet    Family History  Problem Relation Age of Onset  . Adopted: Yes  . Family history unknown: Yes    Social History   Social History  . Marital status: Single    Spouse name: N/A  . Number of children: N/A  . Years of education: N/A   Occupational History  . Not on file.   Social History Main Topics  . Smoking status: Never Smoker  . Smokeless tobacco: Never  Used  . Alcohol use No  . Drug use: No  . Sexual activity: Yes    Birth control/ protection: None   Other Topics Concern  . Not on file   Social History Narrative  . No narrative on file    Current Outpatient Prescriptions on File Prior to Visit  Medication Sig Dispense Refill  . enalapril (VASOTEC) 5 MG tablet Take 5 mg by mouth daily.    Marland Kitchen glipiZIDE (GLUCOTROL) 10 MG tablet Take 10 mg by mouth daily before breakfast.    . phentermine 37.5 MG capsule Take 37.5 mg by mouth every morning.    . norethindrone (MICRONOR,CAMILA,ERRIN) 0.35 MG tablet Take 1 tablet (0.35 mg total) by mouth daily. (Patient not taking: Reported on 07/17/2016) 1 Package 11   No current facility-administered medications on file prior to visit.     No Known Allergies   Review of Systems Constitutional: negative for chills, fatigue, fevers and sweats Eyes: negative for irritation, redness and visual disturbance Ears, nose, mouth, throat, and face: negative for hearing loss, nasal congestion, snoring and tinnitus Respiratory: negative for asthma, cough, sputum Cardiovascular: negative for chest pain, dyspnea, exertional chest pressure/discomfort, irregular heart beat, palpitations and syncope Gastrointestinal: negative for abdominal pain, change in bowel habits, nausea and vomiting Genitourinary: positive for heavy menstrual periods.  Negative for genital lesions, sexual problems  and vaginal discharge, dysuria and urinary incontinence Integument/breast: negative for breast lump, breast tenderness and nipple discharge Hematologic/lymphatic: negative for bleeding and easy bruising Musculoskeletal: negative for back pain and muscle weakness Neurologic: negative for dizziness, headaches, vertigo and weakness Endocrine: negative for diabetic symptoms including polydipsia, polyuria and skin dryness Allergic/Immunologic: negative for hay fever and urticaria      Objective:  Blood pressure (!) 170/108, pulse (!)  105, weight 195 lb 12.8 oz (88.8 kg), last menstrual period 07/02/2016. Body mass index is 42.37 kg/m.  General Appearance:    Alert, cooperative, no distress, appears stated age, morbid obesity.   Head:    Normocephalic, without obvious abnormality, atraumatic  Eyes:    PERRL, conjunctiva/corneas clear, EOM's intact, both eyes  Ears:    Normal external ear canals, both ears  Nose:   Nares normal, septum midline, mucosa normal, no drainage or sinus tenderness  Throat:   Lips, mucosa, and tongue normal; teeth and gums normal  Neck:   Supple, symmetrical, trachea midline, no adenopathy; thyroid: no enlargement/tenderness/nodules; no carotid bruit or JVD  Back:     Symmetric, no curvature, ROM normal, no CVA tenderness  Lungs:     Clear to auscultation bilaterally, respirations unlabored  Chest Wall:    No tenderness or deformity   Heart:    Regular rate and rhythm, S1 and S2 normal, no murmur, rub or gallop  Breast Exam:    No tenderness, masses, or nipple abnormality  Abdomen:     Soft, non-tender, bowel sounds active all four quadrants, no masses, no organomegaly.    Genitalia:  External genitalia Vulva:- Normal. Bartholin's Gland- Bilateral- Normal. Perineum- Normal. Clitoris- Normal. Introitus:Characteristics- Normal(However very narrow introitus with prominent pubic arch.). Discharge- None. Urethra:Characteristics- Normal. Speculum & Bimanual Vagina:No lesions. Vaginal Mucosa- No Atrophy, Relaxation or Secretions. Note: Vaginal mucosa difficult to assess as patient very tense during exam and use of pediatric speculum suboptimal against body habitus. Cervix:Characteristics- Normal(Cervix difficult to assess as patient very tense during exam and use of pediatric speculum suboptimal against body habitus.). Uterus:Characteristics- Enlarged (16-18 week sized) noted on abdominal exam, unable to perform bimanual exam, narrow introitus and very tense during exam and use of  single digit bimanual exam with body habitus.). Position- Midposition. Adnexa:Characteristics- Bilateral- Normal. Note: Uterus difficult to assess as patient very tense during exam and use of single digit bimanual e  Rectal:    Normal external sphincter.  No hemorrhoids appreciated. Internal exam not done.   Extremities:   Joint contraction of knees and elbows bilaterally, shortened upper and lower limbs. Atraumatic, no cyanosis or edema  Pulses:   2+ and symmetric all extremities  Skin:   Skin color, texture, turgor normal, no rashes.   Lymph nodes:   Cervical, supraclavicular, and axillary nodes normal  Neurologic:   CNII-XII intact, normal strength, sensation and reflexes throughout    Labs:  Lab Results  Component Value Date   WBC 6.4 05/09/2016   HGB 12.0 05/09/2016   HCT 35.7 05/09/2016   MCV 78.9 (L) 05/09/2016   PLT 371 05/09/2016    Lab Results  Component Value Date   CREATININE 0.37 (L) 05/09/2016   BUN 11 05/09/2016   NA 135 05/09/2016   K 3.7 05/09/2016   CL 106 05/09/2016   CO2 21 (L) 05/09/2016    Lab Results  Component Value Date   ALT 16 05/09/2016   AST 17 05/09/2016   ALKPHOS 57 05/09/2016   BILITOT 0.4 05/09/2016  Lab Results  Component Value Date   TSH 2.459 01/17/2016    Results for GLORY, WANN (MRN [id number]) as of 07/14/2015 19:40  Ref. Range 04/28/2015 12:37  Glucose Latest Ref Range: 65-99 mg/dL 114 (H)   Assessment:   Annual gynecologic exam.   HTN DM Congenital arthogryposis Menorrhagia H/o DVT Enlarged uterus STD screening   Plan:    All questions answered. Breast self exam technique reviewed and patient encouraged to perform self-exam monthly. Discussed healthy lifestyle modifications.  Encouraged safe sex practices.  STD screening ordered at patient's request.  Pap smear up to date.  Next pap smear due in 11/2018.  HTN uncontrolled. Notes complaince with medications. Denies symptoms. Encouraged to contact PCP  regarding BP.  DM - currently controlled on medications (per patient).  Managed by PCP.  Contraception: None currently.  Encouraged condoms. Can not take OCPs due to h/o DVT.  Has tried progesterone OCPs in the past with worsening of menorrhagia symptoms. Is not a likely candidate for IUD due to difficulty in performing pelvic exam and suspected enlarged uterus. Given handout on other progesterone-only options (Nexplanon, Depo Provera).  Will order pelvic ultrasound for suspsected enlarged uterus (abdominal only as patient likely will not be able to tolerate vaginal probe).   Rubie Maid, MD [facility]

## 2016-07-21 ENCOUNTER — Encounter: Payer: Self-pay | Admitting: Obstetrics and Gynecology

## 2016-07-21 DIAGNOSIS — E669 Obesity, unspecified: Secondary | ICD-10-CM | POA: Insufficient documentation

## 2016-07-21 DIAGNOSIS — Z862 Personal history of diseases of the blood and blood-forming organs and certain disorders involving the immune mechanism: Secondary | ICD-10-CM | POA: Insufficient documentation

## 2016-07-21 DIAGNOSIS — I10 Essential (primary) hypertension: Secondary | ICD-10-CM | POA: Insufficient documentation

## 2016-07-21 DIAGNOSIS — E785 Hyperlipidemia, unspecified: Secondary | ICD-10-CM | POA: Insufficient documentation

## 2016-07-21 DIAGNOSIS — I152 Hypertension secondary to endocrine disorders: Secondary | ICD-10-CM | POA: Insufficient documentation

## 2016-07-21 DIAGNOSIS — E1159 Type 2 diabetes mellitus with other circulatory complications: Secondary | ICD-10-CM | POA: Insufficient documentation

## 2016-07-21 DIAGNOSIS — E119 Type 2 diabetes mellitus without complications: Secondary | ICD-10-CM | POA: Insufficient documentation

## 2016-07-24 ENCOUNTER — Other Ambulatory Visit: Payer: Self-pay | Admitting: Obstetrics and Gynecology

## 2016-07-24 ENCOUNTER — Ambulatory Visit (INDEPENDENT_AMBULATORY_CARE_PROVIDER_SITE_OTHER): Payer: Medicaid Other

## 2016-07-24 DIAGNOSIS — N852 Hypertrophy of uterus: Secondary | ICD-10-CM

## 2016-07-30 ENCOUNTER — Telehealth: Payer: Self-pay | Admitting: Obstetrics and Gynecology

## 2016-07-30 NOTE — Telephone Encounter (Signed)
Pt called for [place] results from last Tuesday.  Thanks Con Memos

## 2016-07-31 NOTE — Telephone Encounter (Signed)
I sent a message to you late last week to have patient come in to discuss ultrasound results.

## 2016-07-31 NOTE — Telephone Encounter (Signed)
Can we please call this pt and have her scheduled for f/u to discuss u/s results per Dr. Marcelline Mates. Thanks

## 2016-07-31 NOTE — Telephone Encounter (Signed)
Appt scheduled for 03/13 with Dr. Marcelline Mates

## 2016-08-07 ENCOUNTER — Encounter: Payer: Self-pay | Admitting: Obstetrics and Gynecology

## 2016-08-07 ENCOUNTER — Ambulatory Visit (INDEPENDENT_AMBULATORY_CARE_PROVIDER_SITE_OTHER): Payer: Medicaid Other | Admitting: Obstetrics and Gynecology

## 2016-08-07 VITALS — BP 172/111 | HR 89 | Ht <= 58 in | Wt 194.0 lb

## 2016-08-07 DIAGNOSIS — N920 Excessive and frequent menstruation with regular cycle: Secondary | ICD-10-CM

## 2016-08-07 DIAGNOSIS — D259 Leiomyoma of uterus, unspecified: Secondary | ICD-10-CM

## 2016-08-07 DIAGNOSIS — I10 Essential (primary) hypertension: Secondary | ICD-10-CM

## 2016-08-07 DIAGNOSIS — Z86718 Personal history of other venous thrombosis and embolism: Secondary | ICD-10-CM

## 2016-08-07 NOTE — Progress Notes (Signed)
    GYNECOLOGY PROGRESS NOTE  Subjective:    Patient ID: Robin Vazquez, female    DOB: [birth year], 31 y.o.   MRN: [id number]  HPI  Patient is a 31 y.o. G0P0000 female who presents for discussion of ultrasound results.  Ultrasound performed due to DUB and uterine enlargement noted on annual exam. Denies complaints today.    The following portions of the patient's history were reviewed and updated as appropriate: allergies, current medications, past family history, past medical history, past social history, past surgical history and problem list.  Review of Systems Pertinent items noted in HPI and remainder of comprehensive ROS otherwise negative.   Objective:   Blood pressure (!) 172/111, pulse 89, height 4\' 9"  (1.448 m), weight 194 lb (88 kg). General appearance: alert and no distress Remainder of exam deferred.   Imaging (Pelvic Ultrasound 07/24/2016):  ULTRASOUND REPORT  Location: [facility] Date of Service: 07/24/16   Indications: Uterine enlargement with history of heavy menses Findings:  The uterus is very enlarged and measures 16.4 x 13.0 x 14.0 cm.  Echo texture is heterogenous with evidence of multiple masses. Within the uterus are multiple suspected fibroids measuring: Due to patient's body habitus and the size of the uterus, only 1 fibroid was able to be seen well enough to attempt measuring. Fibroid 1: central fundus, 9.1 x 9.3 x 6.8 cm.  The Endometrium was not identified due to the number of fibroids and body habitus.  Right and left ovaries were not visualized. Survey of the adnexa demonstrates no adnexal masses. There is no free fluid in the cul de sac.  Impression: 1. Enlarged fibroid uterus that extends at least 5 cm past the umbilitcus.  2. Limited exam due to patient's body habitus and uterine enlargement.  Recommendations: 1.Clinical correlation with the patient's History and Physical Exam.   Macarthur Critchley, RDMS,  RVT  Assessment:   Enlarged fibroid uterus Dysfunctional uterine bleeding (menorrhagia) H/o DVT (superficial) Uncontrolled HTN  Plan:   - Discussed ultrasound findings with patient. After review of findings, further discussion had on management options of the fibroids and abnormal bleeding.  Patient is not a candidate for combined estrogen/rogesterone options or tranexamic acid (Lysteda) due to h/o DVT. Not a candidate for Mirena IUD or endometrial ablation due to significant enlargement of the uterus with multiple large fibroids, as well as desires for future fertility.  Patient can consider Depo Provera, a trial of Depot Lupron, myomectomy, or fibroid embolization.  Discussed risks and benefits of each.  Patient desires to think over options.  Printed patient education handouts were given to the patient to review at home.   - Strongly encouraged patient to contact PCP to readjust BP meds as patient's BPs have been significantly elevated over the past several visits.   A total of 25 minutes were spent face-to-face with the patient during this encounter and over half of that time involved counseling and coordination of care.   Rubie Maid, MD [facility]

## 2016-08-08 ENCOUNTER — Telehealth: Payer: Self-pay | Admitting: *Deleted

## 2016-08-08 NOTE — Telephone Encounter (Signed)
Called pt she states that she has more questions regarding "pellets" and the less invasive procedure. Pt is requesting a call back.

## 2016-08-08 NOTE — Telephone Encounter (Signed)
Patient states that she seen Dr.Cherry yesterday and they had a conversation about possible surgery . Patient has more questions if she decide to go that route. Patient is requesting a call back. Her number is  [phone]. Please advise . Thank you

## 2016-08-09 NOTE — Telephone Encounter (Signed)
Contacted patient, answered all questions regarding options for management of heavy periods and fibroid uterus.  Patient desires to think over options a little more and will notify office of desires when ready.    Rubie Maid, MD [facility]

## 2016-08-14 ENCOUNTER — Other Ambulatory Visit
Admission: RE | Admit: 2016-08-14 | Discharge: 2016-08-14 | Disposition: A | Discharge status: Home / Self Care | Payer: Medicaid Other | Source: Ambulatory Visit | Attending: Internal Medicine | Admitting: Internal Medicine

## 2016-08-14 DIAGNOSIS — E785 Hyperlipidemia, unspecified: Secondary | ICD-10-CM | POA: Insufficient documentation

## 2016-08-14 DIAGNOSIS — E119 Type 2 diabetes mellitus without complications: Secondary | ICD-10-CM | POA: Diagnosis not present

## 2016-08-14 DIAGNOSIS — I10 Essential (primary) hypertension: Secondary | ICD-10-CM | POA: Insufficient documentation

## 2016-08-14 DIAGNOSIS — D509 Iron deficiency anemia, unspecified: Secondary | ICD-10-CM | POA: Insufficient documentation

## 2016-08-14 LAB — COMPREHENSIVE METABOLIC PANEL
ALT: 22 U/L (ref 14–54)
AST: 19 U/L (ref 15–41)
Albumin: 4 g/dL (ref 3.5–5.0)
Alkaline Phosphatase: 54 U/L (ref 38–126)
Anion gap: 7 (ref 5–15)
BUN: 6 mg/dL (ref 6–20)
CHLORIDE: 108 mmol/L (ref 101–111)
CO2: 22 mmol/L (ref 22–32)
CREATININE: 0.34 mg/dL — AB (ref 0.44–1.00)
Calcium: 8.8 mg/dL — ABNORMAL LOW (ref 8.9–10.3)
GFR calc non Af Amer: >60 mL/min (ref >60)
Glucose, Bld: 131 mg/dL — ABNORMAL HIGH (ref 65–99)
Potassium: 3.5 mmol/L (ref 3.5–5.1)
SODIUM: 137 mmol/L (ref 135–145)
Total Bilirubin: 0.3 mg/dL (ref 0.3–1.2)
Total Protein: 7.7 g/dL (ref 6.5–8.1)

## 2016-08-14 LAB — CBC WITH DIFFERENTIAL/PLATELET
BASOS PCT: 1 %
Basophils Absolute: 0 10*3/uL (ref 0–0.1)
EOS ABS: 0.1 10*3/uL (ref 0–0.7)
EOS PCT: 2 %
HCT: 31.6 % — ABNORMAL LOW (ref 35.0–47.0)
Hemoglobin: 10.1 g/dL — ABNORMAL LOW (ref 12.0–16.0)
LYMPHS ABS: 2.5 10*3/uL (ref 1.0–3.6)
Lymphocytes Relative: 36 %
MCH: 24.3 pg — AB (ref 26.0–34.0)
MCHC: 32.1 g/dL (ref 32.0–36.0)
MCV: 75.6 fL — ABNORMAL LOW (ref 80.0–100.0)
Monocytes Absolute: 0.4 10*3/uL (ref 0.2–0.9)
Monocytes Relative: 5 %
Neutro Abs: 3.8 10*3/uL (ref 1.4–6.5)
Neutrophils Relative %: 56 %
PLATELETS: 505 10*3/uL — AB (ref 150–440)
RBC: 4.17 MIL/uL (ref 3.80–5.20)
RDW: 17.9 % — ABNORMAL HIGH (ref 11.5–14.5)
WBC: 6.8 10*3/uL (ref 3.6–11.0)

## 2016-08-14 LAB — LIPID PANEL
CHOL/HDL RATIO: 3.5 ratio
CHOLESTEROL: 138 mg/dL (ref 0–200)
HDL: 40 mg/dL — ABNORMAL LOW (ref >40)
LDL Cholesterol: 90 mg/dL (ref 0–99)
TRIGLYCERIDES: 42 mg/dL (ref <150)
VLDL: 8 mg/dL (ref 0–40)

## 2016-08-14 LAB — TSH: TSH: 1.034 u[IU]/mL (ref 0.350–4.500)

## 2016-08-15 LAB — HEMOGLOBIN A1C
HEMOGLOBIN A1C: 6.7 % — AB (ref 4.8–5.6)
MEAN PLASMA GLUCOSE: 146 mg/dL

## 2016-08-15 LAB — MICROALBUMIN, URINE

## 2016-09-18 ENCOUNTER — Telehealth: Payer: Self-pay | Admitting: Obstetrics and Gynecology

## 2016-09-18 NOTE — Telephone Encounter (Signed)
Patient called requesting a referral for fibroid removal. Do you know anything about this?

## 2016-09-24 NOTE — Telephone Encounter (Signed)
Yes.  She would need to be referred to [facility] for their minimally invasive gynecologic surgery clinic.

## 2016-11-22 ENCOUNTER — Ambulatory Visit (INDEPENDENT_AMBULATORY_CARE_PROVIDER_SITE_OTHER): Payer: Medicaid Other | Admitting: Obstetrics and Gynecology

## 2016-11-22 ENCOUNTER — Encounter: Payer: Self-pay | Admitting: Obstetrics and Gynecology

## 2016-11-22 VITALS — BP 154/113 | HR 80 | Ht <= 58 in | Wt 190.8 lb

## 2016-11-22 DIAGNOSIS — D259 Leiomyoma of uterus, unspecified: Secondary | ICD-10-CM | POA: Diagnosis not present

## 2016-11-22 DIAGNOSIS — N920 Excessive and frequent menstruation with regular cycle: Secondary | ICD-10-CM

## 2016-11-22 DIAGNOSIS — O039 Complete or unspecified spontaneous abortion without complication: Secondary | ICD-10-CM | POA: Diagnosis not present

## 2016-11-22 DIAGNOSIS — I10 Essential (primary) hypertension: Secondary | ICD-10-CM

## 2016-11-22 NOTE — Progress Notes (Signed)
GYNECOLOGY PROGRESS NOTE  Subjective:    Patient ID: Robin Vazquez Date, female    DOB: 26-Nov-1985, 31 y.o.   MRN: [id number]  HPI  Patient is a 31 y.o. G0P0000 female with a PMH of obesity, uncontrolled HTN, menorrhagia and enlarged fibroid uterus who presents for follow up after possible miscarriage. Patient's last menstrual period was 10/13/2016. Notes positive home UPT on 11/09/16 (patient took photo on cell phone of positive test).  Notes that the following week she began to have moderate bleeding with passage of clots and associated cramping (last Friday and Saturday). Following the episode several days later, she took another home UPT and it was negative.  Notes that she is still having some light bleeding, and cramping, but has improved since last week.   Of note, patient reports that she was recently initiated on Phentermine, and was on this last month when pregnancy occurred.  Stopped at [employer] of pregnancy.  Also reports that she did f/u at [facility] earlier this month to discuss UFE for large fibroids, prior to discovery of her pregnancy.  Had a pap smear done at that time which she reports was abnormal. States she was told to have it repeated in 1 year.    The following portions of the patient's history were reviewed and updated as appropriate: allergies, current medications, past family history, past medical history, past social history, past surgical history and problem list.  Review of Systems Pertinent items noted in HPI and remainder of comprehensive ROS otherwise negative.   Objective:   Blood pressure (!) 154/113, pulse 80, height 4\' 9"  (1.448 m), weight 190 lb 12.8 oz (86.5 kg), last menstrual period 10/13/2016.  Body mass index is 41.29 kg/m.  General appearance: alert and no distress Remainder of exam deferred.    Assessment:   S/p miscarriage Obesity (BMI 41) Uncontrolled HTN Menorrhagia Enlarged fibroid uterus H/o abnormal pap smear  Plan:   - Patient s/p  miscarriage (+ home UPT followed by bleeding with cramping, and subsequent negative UPT). Discussed future plans for fertility.  Patient notes that she does not to be pregnant again right away as she recently initiated a weight loss regimen using meds (has lost 4 lbs over the past month).  Notes that she desires to lose more weight prior to attempting pregnancy.  Discussed contraception options again with patient.  Cannot use estrogen containing methods due to uncontrolled HTN.  Has tried mini-pill and Depo Provera which worsened bleeding.  Patient declines IUD (as well as narrow introitus may make placement difficult).  Patient will opt to use condoms while taking weight loss medications.  - Uncontrolled HTN (again strongly encouraged patient to f/u with PCP, notes she has not seen one in several months.  This has been recommended at several of her recent visits with me). Discussed risk factors to a potential pregnancy if HTN is uncontrolled, as well as morbidity of stroke, CAD, and heart attack.  - Menorrhagia, patient has tried several methods of contraception in the past to manage, which have worsened bleeding.  Notes that it is manageable currently.  - Enlarged fibroid uterus. Patient s/p consultation with [facility] for possible UFE.  Has decided to hold on procedure for now.  Discussed that fibroids could potentially affect her pregnancy via miscarriage or preterm labor, depending on their sizes and locations.  Patient notes understanding.  - H/o abnormal pap smear.  Review in Care Everywhere notes LGSIL pap (HPV pending).  If HPV negative, can repeat pap smear in  1 year.  If positive, patient will need a colposcopy.  Discussed this with patient who notes understanding. Will f/u with pap smear.  - To f/u as needed.   A total of 25 minutes were spent face-to-face with the patient during this encounter and over half of that time involved counseling and coordination of care.   Rubie Maid, MD [facility]

## 2016-12-11 ENCOUNTER — Telehealth: Payer: Self-pay | Admitting: Obstetrics and Gynecology

## 2016-12-11 NOTE — Telephone Encounter (Signed)
Patient called with a question regarding her cycle post miscarriage. Thanks

## 2016-12-11 NOTE — Telephone Encounter (Signed)
Called pt she states that she is concerned as she is having cramping but has not had any bleeding. Advised pt that it typically takes 3 months for "normal" menses to resume. Advised pt that it has only been a few weeks and that it will take some time for her body to regulate from loss. Advised on the use of tylenol or motrin for cramping.

## 2017-01-04 ENCOUNTER — Telehealth: Payer: Self-pay | Admitting: Obstetrics and Gynecology

## 2017-01-04 DIAGNOSIS — N920 Excessive and frequent menstruation with regular cycle: Secondary | ICD-10-CM

## 2017-01-04 MED ORDER — NORETHINDRONE ACETATE 5 MG PO TABS: 5.0000 mg | ORAL_TABLET | Freq: Every day | ORAL | 6 refills | Status: DC

## 2017-01-04 NOTE — Telephone Encounter (Signed)
Called pt no answer. LM for pt informing her that RX has been sent in to take daily to control bleeding.

## 2017-01-04 NOTE — Addendum Note (Signed)
Addended by: Donalee Citrin on: 01/04/2017 04:42 PM   Modules accepted: Orders

## 2017-01-04 NOTE — Telephone Encounter (Signed)
Patient called requesting a medication that will help heavy cycles. She stated she discussed this with Dr Marcelline Mates at her last visit. Thanks

## 2017-01-04 NOTE — Telephone Encounter (Signed)
I have discussed a lot of different options with her in the past. I think she is talking about Lysteda but if I'm not mistaken she has a history of a blood clot and can no longer use this.  We can try Aygestin 5 mg daily.  3 refills.

## 2017-01-04 NOTE — Telephone Encounter (Signed)
I do not see anything in the last note, please advise.

## 2017-01-08 ENCOUNTER — Telehealth: Payer: Self-pay | Admitting: Obstetrics and Gynecology

## 2017-01-08 NOTE — Telephone Encounter (Signed)
Pt aware to take aygestin daily. Advised to f/u with [facility] in a month.

## 2017-01-08 NOTE — Telephone Encounter (Signed)
Please call patient concerning the [facility] pill instructions

## 2017-01-24 ENCOUNTER — Other Ambulatory Visit
Admission: RE | Admit: 2017-01-24 | Discharge: 2017-01-24 | Disposition: A | Discharge status: Home / Self Care | Payer: Medicaid Other | Source: Ambulatory Visit | Attending: Nurse Practitioner | Admitting: Nurse Practitioner

## 2017-01-24 DIAGNOSIS — R5383 Other fatigue: Secondary | ICD-10-CM | POA: Diagnosis present

## 2017-01-24 DIAGNOSIS — E119 Type 2 diabetes mellitus without complications: Secondary | ICD-10-CM | POA: Insufficient documentation

## 2017-01-24 DIAGNOSIS — E785 Hyperlipidemia, unspecified: Secondary | ICD-10-CM | POA: Diagnosis present

## 2017-01-24 LAB — COMPREHENSIVE METABOLIC PANEL
ALK PHOS: 46 U/L (ref 38–126)
ALT: 13 U/L — ABNORMAL LOW (ref 14–54)
ANION GAP: 6 (ref 5–15)
AST: 14 U/L — ABNORMAL LOW (ref 15–41)
Albumin: 4 g/dL (ref 3.5–5.0)
BILIRUBIN TOTAL: 0.6 mg/dL (ref 0.3–1.2)
BUN: 6 mg/dL (ref 6–20)
CALCIUM: 8.8 mg/dL — AB (ref 8.9–10.3)
CO2: 23 mmol/L (ref 22–32)
CREATININE: 0.35 mg/dL — AB (ref 0.44–1.00)
Chloride: 109 mmol/L (ref 101–111)
GFR calc non Af Amer: >60 mL/min (ref >60)
Glucose, Bld: 116 mg/dL — ABNORMAL HIGH (ref 65–99)
Potassium: 3.7 mmol/L (ref 3.5–5.1)
Sodium: 138 mmol/L (ref 135–145)
Total Protein: 7.7 g/dL (ref 6.5–8.1)

## 2017-01-24 LAB — CBC
HCT: 36.9 % (ref 35.0–47.0)
HEMOGLOBIN: 12.1 g/dL (ref 12.0–16.0)
MCH: 25.9 pg — AB (ref 26.0–34.0)
MCHC: 32.8 g/dL (ref 32.0–36.0)
MCV: 79.1 fL — AB (ref 80.0–100.0)
Platelets: 389 10*3/uL (ref 150–440)
RBC: 4.67 MIL/uL (ref 3.80–5.20)
RDW: 18.7 % — ABNORMAL HIGH (ref 11.5–14.5)
WBC: 6.7 10*3/uL (ref 3.6–11.0)

## 2017-01-24 LAB — LIPID PANEL
CHOL/HDL RATIO: 5.9 ratio
Cholesterol: 154 mg/dL (ref 0–200)
HDL: 26 mg/dL — AB (ref >40)
LDL CALC: 119 mg/dL — AB (ref 0–99)
Triglycerides: 45 mg/dL (ref <150)
VLDL: 9 mg/dL (ref 0–40)

## 2017-01-24 LAB — TSH: TSH: 1.779 u[IU]/mL (ref 0.350–4.500)

## 2017-01-24 LAB — HEMOGLOBIN A1C
Hgb A1c MFr Bld: 6.8 % — ABNORMAL HIGH (ref 4.8–5.6)
MEAN PLASMA GLUCOSE: 148.46 mg/dL

## 2017-02-04 ENCOUNTER — Telehealth: Payer: Self-pay | Admitting: Obstetrics and Gynecology

## 2017-02-04 NOTE — Telephone Encounter (Signed)
Patient started her BC pills on the 01/09/2017 - and she started her cycle that day also and hasn't stopped bleeding since - light flow - PCP instructed patient to call because she is anemic. Is this normal for a new start?? Should she stop taking the pills?  Please call

## 2017-02-06 NOTE — Telephone Encounter (Signed)
Called pt no answer. LM for pt informing her of the need to be seen in office for appt, also gave options for increasing medication to TID per Dr.Evans to control bleeding until pt can be seen. Pt to call back.      Please prescribe Aygestin 5 mg - 1 PO TID Total 21 days and have her schedule an appt with Cr.Cherry next week to review options (surgical options ?)

## 2017-02-06 NOTE — Telephone Encounter (Signed)
[place] pt, currently taking norethindrone 5mg  since 01/09/17 to help with heavy menses, has had continuous bleeding since beginning medication. Unable to do estrogen due to uncontrolled HTN, has tried mini pill and depo which worsened bleeding. Please advise.

## 2017-02-21 ENCOUNTER — Encounter: Payer: Self-pay | Admitting: Obstetrics and Gynecology

## 2017-02-21 ENCOUNTER — Ambulatory Visit (INDEPENDENT_AMBULATORY_CARE_PROVIDER_SITE_OTHER): Payer: Medicaid Other | Admitting: Obstetrics and Gynecology

## 2017-02-21 DIAGNOSIS — D259 Leiomyoma of uterus, unspecified: Secondary | ICD-10-CM

## 2017-02-21 DIAGNOSIS — N939 Abnormal uterine and vaginal bleeding, unspecified: Secondary | ICD-10-CM

## 2017-02-21 DIAGNOSIS — Z86718 Personal history of other venous thrombosis and embolism: Secondary | ICD-10-CM | POA: Diagnosis not present

## 2017-02-21 NOTE — Progress Notes (Signed)
    GYNECOLOGY PROGRESS NOTE  Subjective:    Patient ID: Robin Vazquez, female    DOB: 1986-02-05, 31 y.o.   MRN: [id number]  HPI  Patient is a 31 y.o. G46P0010 female who presents for further discussion of management of her dysfunctional uterine bleeding and fibroids.  Is currently taking Aygestin. Is unable to use hormonal contraceptives due to h/o blood clot on progesterone OCPs. Could not take combined OCPs due to severe range uncontrolled BPs.   Patient today notes that she would like to proceed with surgical intervention. Notes that she has been doing more research online since our last discussion regarding management options.  Desires to have a myomectomy performed.  Would prefer to have surgery performed laparoscopically if possible.   The following portions of the patient's history were reviewed and updated as appropriate: allergies, current medications, past family history, past medical history, past social history, past surgical history and problem list.  Review of Systems Pertinent items noted in HPI and remainder of comprehensive ROS otherwise negative.   Objective:   Last menstrual period 02/15/2017. General appearance: alert and no distress Abdomen: normal findings: bowel sounds normal and soft, non-tender and abnormal findings:  mass, located arising from the pelvis up to the umbilicus.   Pelvic: deferred   Assessment:   Enlarged fibroid uterus DUB H/o blood clots  Plan:   Patient desiring surgical management with myomectomy. Desires to preserve fertility.  Desires minimally invasive approach.  Discussed that due to patient's body habitus and size of fibroids, it would be recommended that she be seen by a minimally invasive specialist.  Will refer to [facility].    Rubie Maid, MD [facility]

## 2017-02-21 NOTE — Patient Instructions (Addendum)
Myomectomy Myomectomy is surgery to remove a noncancerous tumor (myoma) from the uterus. Myomas are tumors made up of fibrous tissue. They are often called fibroid tumors. Fibroid tumors can range from the size of a pea to the size of a grapefruit. In a myomectomy, the fibroid tumor is removed without removing the uterus. Because these tumors are rarely cancerous, this surgery is usually done only if the tumor is growing or causing symptoms such as pain, pressure, bleeding, or pain with intercourse. LET YOUR HEALTH CARE PROVIDER KNOW ABOUT:  Any allergies you have.  All medicines you are taking, including vitamins, herbs, eye drops, creams, and over-the-counter medicines.  Previous problems you or members of your family have had with the use of anesthetics.  Any blood disorders you have.  Previous surgeries you have had.  Medical conditions you have. RISKS AND COMPLICATIONS Generally, this is a safe procedure. However, as with any procedure, complications can occur. Possible complications include:  Excessive bleeding.  Infection.  Injury to nearby organs.  Blood clots in the legs, chest, or brain.  Scar tissue on other organs and in the pelvis. This may require another surgery to remove the scar tissue.  BEFORE THE PROCEDURE  Ask your health care provider about changing or stopping your regular medicines. Avoid taking aspirin or blood thinners as directed by your health care provider.  Do not  eat or drink anything after midnight on the night before surgery.  If you smoke, do not  smoke for 2 weeks before the surgery.  Do not  drink alcohol the day before the surgery.  Arrange for someone to drive you home after the procedure or after your hospital stay. Also arrange for someone to help you with activities during your recovery. PROCEDURE You will be given medicine to make you sleep through the procedure (general anesthetic). Any of the following methods may be used to perform  a myomectomy:  Small monitors will be put on your body. They are used to check your heart, blood pressure, and oxygen level.  An IV access tube will be put into one of your veins. Medicine will be able to flow directly into your body through this IV tube.  You might be given a medicine to help you relax (sedative).  You will be given a medicine to make you sleep (general anesthetic). A breathing tube will be placed into your lungs during the procedure.  A thin, flexible tube (catheter) will be inserted into your bladder to collect urine.  Any of the following methods may be used to perform a myomectomy: ? Hysteroscopic myomectomy-This method may be used when the fibroid tumor is inside the cavity of the uterus. A long, thin tube that is like a telescope (hysteroscope) is inserted inside the uterus. A saline solution is put into your uterus. This expands the uterus and allows the surgeon to see the fibroids. Tools are passed through the hysteroscope to remove the fibroid tumor in pieces. ? Laparoscopic myomectomy-A few small cuts (incisions) are made in the lower abdomen. A thin, lighted tube with a tiny camera on the end (laparoscope) is inserted through one of the incisions. This gives the surgeon a good view of the area. The fibroid tumor is removed through the other incisions. The incisions are then closed with stitches (sutures) or staples. ? Abdominal myomectomy-This method is used when the fibroid tumor cannot be removed with a hysteroscope or laparoscope. The surgery is performed through a larger surgical incision in the abdomen. The   fibroid tumor is removed through this incision. The incision is closed with sutures or staples.  What to expect after the procedure  If you had a laparoscopic or hysteroscopic myomectomy, you may be able to go home the same day, or you may need to stay in the hospital overnight.  If you had an abdominal myomectomy, you may need to stay in the hospital for a  few days.  Your IV access tube and catheter will be removed in 1-2 days.  You may be given medicine for pain or to help you sleep.  You may be given an antibiotic medicine, if needed. This information is not intended to replace advice given to you by your health care provider. Make sure you discuss any questions you have with your health care provider. Document Released: 03/11/2007 Document Revised: 10/20/2015 Document Reviewed: 12/24/2012 Elsevier Interactive Patient Education  2017 [employer]  

## 2017-03-04 ENCOUNTER — Telehealth: Payer: Self-pay | Admitting: Obstetrics and Gynecology

## 2017-03-04 NOTE — Telephone Encounter (Signed)
Pt aware referral was placed on 9/27. She is aware it may take several weeks before they call her.

## 2017-03-04 NOTE — Telephone Encounter (Signed)
Patient called and stated that .she was referred to a surgical office and she never heard anything back, The patient would like to have the number and address to the office she has been referred to so she can schedule her own appointment. The patient would not like to wait any longer to be seen. No other information was disclosed other than wanting a returned call to answer her concerns and questions. Please advise.

## 2017-03-07 ENCOUNTER — Encounter: Payer: Self-pay | Admitting: Obstetrics and Gynecology

## 2017-03-14 ENCOUNTER — Telehealth: Payer: Self-pay | Admitting: Obstetrics and Gynecology

## 2017-03-14 NOTE — Telephone Encounter (Signed)
Error

## 2017-04-15 ENCOUNTER — Other Ambulatory Visit: Payer: Self-pay | Admitting: Obstetrics and Gynecology

## 2017-04-15 DIAGNOSIS — D259 Leiomyoma of uterus, unspecified: Secondary | ICD-10-CM

## 2017-04-24 ENCOUNTER — Ambulatory Visit: Payer: Self-pay

## 2017-05-06 ENCOUNTER — Ambulatory Visit: Payer: Medicaid Other

## 2017-05-11 ENCOUNTER — Other Ambulatory Visit: Payer: Self-pay | Admitting: Obstetrics and Gynecology

## 2017-05-11 ENCOUNTER — Ambulatory Visit
Admission: RE | Admit: 2017-05-11 | Discharge: 2017-05-11 | Disposition: A | Discharge status: Home / Self Care | Payer: Medicaid Other | Source: Ambulatory Visit | Attending: Obstetrics and Gynecology | Admitting: Obstetrics and Gynecology

## 2017-05-11 DIAGNOSIS — Q6589 Other specified congenital deformities of hip: Secondary | ICD-10-CM | POA: Diagnosis not present

## 2017-05-11 DIAGNOSIS — D259 Leiomyoma of uterus, unspecified: Secondary | ICD-10-CM

## 2017-05-14 ENCOUNTER — Other Ambulatory Visit
Admission: RE | Admit: 2017-05-14 | Discharge: 2017-05-14 | Disposition: A | Discharge status: Home / Self Care | Payer: Medicaid Other | Source: Ambulatory Visit | Attending: Nurse Practitioner | Admitting: Nurse Practitioner

## 2017-05-14 DIAGNOSIS — E1165 Type 2 diabetes mellitus with hyperglycemia: Secondary | ICD-10-CM | POA: Diagnosis present

## 2017-05-14 DIAGNOSIS — E785 Hyperlipidemia, unspecified: Secondary | ICD-10-CM | POA: Insufficient documentation

## 2017-05-14 DIAGNOSIS — E509 Vitamin A deficiency, unspecified: Secondary | ICD-10-CM | POA: Insufficient documentation

## 2017-05-14 DIAGNOSIS — I509 Heart failure, unspecified: Secondary | ICD-10-CM | POA: Diagnosis present

## 2017-05-14 LAB — CBC WITH DIFFERENTIAL/PLATELET
Basophils Absolute: 0.1 10*3/uL (ref 0–0.1)
Basophils Relative: 1 %
EOS PCT: 2 %
Eosinophils Absolute: 0.1 10*3/uL (ref 0–0.7)
HCT: 37.4 % (ref 35.0–47.0)
Hemoglobin: 12.3 g/dL (ref 12.0–16.0)
LYMPHS PCT: 35 %
Lymphs Abs: 2 10*3/uL (ref 1.0–3.6)
MCH: 26.8 pg (ref 26.0–34.0)
MCHC: 32.8 g/dL (ref 32.0–36.0)
MCV: 81.6 fL (ref 80.0–100.0)
MONO ABS: 0.3 10*3/uL (ref 0.2–0.9)
Monocytes Relative: 5 %
Neutro Abs: 3.2 10*3/uL (ref 1.4–6.5)
Neutrophils Relative %: 57 %
PLATELETS: 399 10*3/uL (ref 150–440)
RBC: 4.58 MIL/uL (ref 3.80–5.20)
RDW: 15.2 % — AB (ref 11.5–14.5)
WBC: 5.7 10*3/uL (ref 3.6–11.0)

## 2017-05-14 LAB — COMPREHENSIVE METABOLIC PANEL
ALT: 21 U/L (ref 14–54)
ANION GAP: 10 (ref 5–15)
AST: 21 U/L (ref 15–41)
Albumin: 4.2 g/dL (ref 3.5–5.0)
Alkaline Phosphatase: 65 U/L (ref 38–126)
BUN: 9 mg/dL (ref 6–20)
CHLORIDE: 105 mmol/L (ref 101–111)
CO2: 22 mmol/L (ref 22–32)
Calcium: 8.9 mg/dL (ref 8.9–10.3)
Creatinine, Ser: 0.37 mg/dL — ABNORMAL LOW (ref 0.44–1.00)
Glucose, Bld: 152 mg/dL — ABNORMAL HIGH (ref 65–99)
POTASSIUM: 3.3 mmol/L — AB (ref 3.5–5.1)
Sodium: 137 mmol/L (ref 135–145)
Total Bilirubin: 0.4 mg/dL (ref 0.3–1.2)
Total Protein: 7.8 g/dL (ref 6.5–8.1)

## 2017-05-14 LAB — LIPID PANEL
CHOL/HDL RATIO: 3.8 ratio
CHOLESTEROL: 158 mg/dL (ref 0–200)
HDL: 42 mg/dL (ref >40)
LDL Cholesterol: 107 mg/dL — ABNORMAL HIGH (ref 0–99)
TRIGLYCERIDES: 43 mg/dL (ref <150)
VLDL: 9 mg/dL (ref 0–40)

## 2017-05-14 LAB — TSH: TSH: 2.051 u[IU]/mL (ref 0.350–4.500)

## 2017-05-15 LAB — HEMOGLOBIN A1C
Hgb A1c MFr Bld: 8.1 % — ABNORMAL HIGH (ref 4.8–5.6)
Mean Plasma Glucose: 186 mg/dL

## 2017-06-18 ENCOUNTER — Telehealth: Payer: Self-pay | Admitting: Obstetrics and Gynecology

## 2017-06-18 NOTE — Telephone Encounter (Signed)
The patient called and stated that she needs the information to contact a office that she was referred to. No other information was disclosed. Please advise.

## 2017-06-19 NOTE — Telephone Encounter (Signed)
Pt aware we referred her to [facility]. She will contact them for f/u.

## 2017-08-12 ENCOUNTER — Ambulatory Visit (INDEPENDENT_AMBULATORY_CARE_PROVIDER_SITE_OTHER): Payer: Medicaid Other | Admitting: Obstetrics and Gynecology

## 2017-08-12 ENCOUNTER — Encounter: Payer: Self-pay | Admitting: Obstetrics and Gynecology

## 2017-08-12 VITALS — BP 167/109 | HR 74 | Ht <= 58 in | Wt 194.5 lb

## 2017-08-12 DIAGNOSIS — B9689 Other specified bacterial agents as the cause of diseases classified elsewhere: Secondary | ICD-10-CM | POA: Diagnosis not present

## 2017-08-12 DIAGNOSIS — Z202 Contact with and (suspected) exposure to infections with a predominantly sexual mode of transmission: Secondary | ICD-10-CM | POA: Diagnosis not present

## 2017-08-12 DIAGNOSIS — N760 Acute vaginitis: Secondary | ICD-10-CM | POA: Diagnosis not present

## 2017-08-12 DIAGNOSIS — I10 Essential (primary) hypertension: Secondary | ICD-10-CM

## 2017-08-12 MED ORDER — METRONIDAZOLE 500 MG PO TABS: 500.0000 mg | ORAL_TABLET | Freq: Two times a day (BID) | ORAL | 0 refills | Status: AC

## 2017-08-12 NOTE — Addendum Note (Signed)
Addended by: Elouise Munroe on: 08/12/2017 02:54 PM   Modules accepted: Orders

## 2017-08-12 NOTE — Progress Notes (Signed)
HPI:      [name] is a 32 y.o. G1P0010 who LMP was Patient's last menstrual period was 07/25/2017 (exact date).  Subjective:   She presents today with complaint of vaginal itching.  Denies discharge.  Itching has been present for approximately 3 days. She denies new sexual partners but she would like to be tested for STDs.    Hx: The following portions of the patient's history were reviewed and updated as appropriate:             She  has a past medical history of Acute superficial venous thrombosis of left lower extremity (02/2016), Anemia, Congenital multiple arthrogryposis, Diabetes mellitus without complication ([place]), Herpes, Hyperlipemia, Hypertension, and Obesity. She does not have any pertinent problems on file. She  has a past surgical history that includes joint. Her She was adopted. Family history is unknown by patient. She  reports that  has never smoked. she has never used smokeless tobacco. She reports that she does not drink alcohol or use drugs. She has a current medication list which includes the following prescription(s): clotrimazole-betamethasone, enalapril, glipizide, norethindrone, and phentermine. She has No Known Allergies.       Review of Systems:  Review of Systems  Constitutional: Denied constitutional symptoms, night sweats, recent illness, fatigue, fever, insomnia and weight loss.  Eyes: Denied eye symptoms, eye pain, photophobia, vision change and visual disturbance.  Ears/Nose/Throat/Neck: Denied ear, nose, throat or neck symptoms, hearing loss, nasal discharge, sinus congestion and sore throat.  Cardiovascular: Denied cardiovascular symptoms, arrhythmia, chest pain/pressure, edema, exercise intolerance, orthopnea and palpitations.  Respiratory: Denied pulmonary symptoms, asthma, pleuritic pain, productive sputum, cough, dyspnea and wheezing.  Gastrointestinal: Denied, gastro-esophageal reflux, melena, nausea and vomiting.  Genitourinary: See HPI  for additional information.  Musculoskeletal: Denied musculoskeletal symptoms, stiffness, swelling, muscle weakness and myalgia.  Dermatologic: Denied dermatology symptoms, rash and scar.  Neurologic: Denied neurology symptoms, dizziness, headache, neck pain and syncope.  Psychiatric: Denied psychiatric symptoms, anxiety and depression.  Endocrine: Denied endocrine symptoms including hot flashes and night sweats.   Meds:   Current Outpatient Medications on File Prior to Visit  Medication Sig Dispense Refill  . clotrimazole-betamethasone (LOTRISONE) cream Apply topically.    . enalapril (VASOTEC) 5 MG tablet Take 5 mg by mouth daily.    Marland Kitchen glipiZIDE (GLUCOTROL) 10 MG tablet Take 10 mg by mouth daily before breakfast.    . norethindrone (AYGESTIN) 5 MG tablet Take 1 tablet (5 mg total) by mouth daily. 30 tablet 6  . phentermine 37.5 MG capsule Take 37.5 mg by mouth every morning.     No current facility-administered medications on file prior to visit.     Objective:     Vitals:   08/12/17 1326 08/12/17 1327  BP: (!) 188/134 (!) 167/109  Pulse: 77 74              Physical examination   Pelvic:   Vulva: Normal appearance.  No lesions.  Vagina: No lesions or abnormalities noted.  Support: Normal pelvic support.  Urethra No masses tenderness or scarring.  Meatus Normal size without lesions or prolapse.  Cervix: Normal appearance.  No lesions.  Anus: Normal exam.  No lesions.  Perineum: Normal exam.  No lesions.        Bimanual   Uterus: Normal size.  Non-tender.  Mobile.  AV.  Adnexae: No masses.  Non-tender to palpation.  Cul-de-sac: Negative for abnormality.   Exam very difficult because of patient's medical condition and inability  to tolerate pelvic exam.  WET PREP: clue cells: present, KOH (yeast): negative, odor: present and trichomoniasis: negative Ph:  > 4.5   Assessment:    G1P0010 Patient Active Problem List   Diagnosis Date Noted  . History of anemia  07/21/2016  . Diabetes mellitus ([place]) 07/21/2016  . Hyperlipemia 07/21/2016  . Obesity 07/21/2016  . Hypertension 07/21/2016  . History of blood clots 03/16/2016     1. Bacterial vulvovaginitis        Plan:            1.  I will treat with Flagyl.  Discussed use of probiotics.  GC/CT sent.  2.  I have discussed her hypertension with her and have advised her to speak with her medical doctor today for hypertension control.  Orders No orders of the defined types were placed in this encounter.   No orders of the defined types were placed in this encounter.     F/U  No Follow-up on file.  Finis Bud, M.D. 08/12/2017 2:18 PM

## 2017-08-14 LAB — CHLAMYDIA/GONOCOCCUS/TRICHOMONAS, NAA
CHLAMYDIA BY NAA: NEGATIVE
Gonococcus by NAA: NEGATIVE
TRICH VAG BY NAA: NEGATIVE

## 2017-08-21 ENCOUNTER — Other Ambulatory Visit: Payer: Self-pay

## 2017-08-21 ENCOUNTER — Telehealth: Payer: Self-pay | Admitting: Obstetrics and Gynecology

## 2017-08-21 MED ORDER — VALACYCLOVIR HCL 1 G PO TABS: 1000.0000 mg | ORAL_TABLET | Freq: Two times a day (BID) | ORAL | 3 refills | Status: DC

## 2017-08-21 NOTE — Telephone Encounter (Signed)
Pt called back and informed that medication had been sent to her pharmacy for her outbreak.

## 2017-08-21 NOTE — Telephone Encounter (Signed)
Pt was called back to talk to her about if she continue to have outbreaks that she may need to be on continuance medication to control it. Pt was advise to please call the office if she uses those refills up in less than 2 months.

## 2017-08-21 NOTE — Telephone Encounter (Signed)
Patient called stating she is having a herpes outbreak. Does she need to be seen or can something be called in? Please Advise. Thanks

## 2017-08-21 NOTE — Addendum Note (Signed)
Addended by: Edwyna Shell on: 08/21/2017 12:11 PM   Modules accepted: Orders

## 2017-08-29 ENCOUNTER — Telehealth: Payer: Self-pay | Admitting: Obstetrics and Gynecology

## 2017-08-29 NOTE — Telephone Encounter (Signed)
Pt having itching and some discharge; feels she has a yeast infection. Desires meds called to [facility].

## 2017-08-30 NOTE — Telephone Encounter (Signed)
Pt was called back spoke with her concerning her itching and burning. Pt stated that she used cool compress on the vaginal  area and it stopped and seem to be doing better today.

## 2017-09-02 ENCOUNTER — Other Ambulatory Visit: Payer: Self-pay

## 2017-09-02 ENCOUNTER — Telehealth: Payer: Self-pay | Admitting: *Deleted

## 2017-09-02 MED ORDER — FLUCONAZOLE 100 MG PO TABS: 100.0000 mg | ORAL_TABLET | Freq: Every day | ORAL | 0 refills | Status: DC

## 2017-09-02 NOTE — Telephone Encounter (Signed)
Pt is called back and spoke with her concerning her itching and burning x 2 weeks. Pt called on Friday with the same issues. Pt symptoms is not improving and was given 1 dose of Diflucan and was informed if that medication did not help that she needed to come into the office to be seen.

## 2017-09-02 NOTE — Telephone Encounter (Signed)
Patient called and states she is experiencing symptoms of a yeast infections. Patient is wondering if Dr. Marcelline Mates can call her in something for it . Patient pharmacy  is [facility] on [address]. Please advise. Thank you

## 2017-09-02 NOTE — Telephone Encounter (Signed)
Pt called back. Pt still having same symptoms as Friday with no improvement. Pt was escripted in 1 dose of difulcan and was informed that if her symptoms did not improve in 7 days for her to call back and set an appt to be seen by [facility].

## 2017-09-05 ENCOUNTER — Other Ambulatory Visit
Admission: RE | Admit: 2017-09-05 | Discharge: 2017-09-05 | Disposition: A | Discharge status: Home / Self Care | Payer: Medicaid Other | Source: Ambulatory Visit | Attending: Nurse Practitioner | Admitting: Nurse Practitioner

## 2017-09-05 DIAGNOSIS — E785 Hyperlipidemia, unspecified: Secondary | ICD-10-CM | POA: Insufficient documentation

## 2017-09-05 DIAGNOSIS — E1165 Type 2 diabetes mellitus with hyperglycemia: Secondary | ICD-10-CM | POA: Diagnosis present

## 2017-09-05 DIAGNOSIS — D509 Iron deficiency anemia, unspecified: Secondary | ICD-10-CM | POA: Diagnosis present

## 2017-09-05 LAB — CBC WITH DIFFERENTIAL/PLATELET
BASOS PCT: 0 %
Band Neutrophils: 0 %
Basophils Absolute: 0 10*3/uL (ref 0–0.1)
Blasts: 0 %
EOS PCT: 0 %
Eosinophils Absolute: 0 10*3/uL (ref 0–0.7)
HCT: 41.7 % (ref 35.0–47.0)
Hemoglobin: 14.4 g/dL (ref 12.0–16.0)
LYMPHS ABS: 4.5 10*3/uL — AB (ref 1.0–3.6)
Lymphocytes Relative: 78 %
MCH: 27.4 pg (ref 26.0–34.0)
MCHC: 34.5 g/dL (ref 32.0–36.0)
MCV: 79.2 fL — AB (ref 80.0–100.0)
METAMYELOCYTES PCT: 0 %
MONO ABS: 0.2 10*3/uL (ref 0.2–0.9)
Monocytes Relative: 4 %
Myelocytes: 0 %
NEUTROS ABS: 1 10*3/uL — AB (ref 1.4–6.5)
NEUTROS PCT: 18 %
NRBC: 0 /100{WBCs}
Other: 0 %
PLATELETS: 295 10*3/uL (ref 150–440)
Promyelocytes Relative: 0 %
RBC: 5.27 MIL/uL — ABNORMAL HIGH (ref 3.80–5.20)
RDW: 14.7 % — AB (ref 11.5–14.5)
WBC: 5.7 10*3/uL (ref 3.6–11.0)

## 2017-09-05 LAB — COMPREHENSIVE METABOLIC PANEL
ALT: 33 U/L (ref 14–54)
ANION GAP: 6 (ref 5–15)
AST: 26 U/L (ref 15–41)
Albumin: 4.2 g/dL (ref 3.5–5.0)
Alkaline Phosphatase: 75 U/L (ref 38–126)
BUN: 11 mg/dL (ref 6–20)
CO2: 26 mmol/L (ref 22–32)
Calcium: 9.1 mg/dL (ref 8.9–10.3)
Chloride: 103 mmol/L (ref 101–111)
Creatinine, Ser: 0.3 mg/dL — ABNORMAL LOW (ref 0.44–1.00)
GFR calc Af Amer: >60 mL/min (ref >60)
Glucose, Bld: 251 mg/dL — ABNORMAL HIGH (ref 65–99)
Potassium: 3.5 mmol/L (ref 3.5–5.1)
Sodium: 135 mmol/L (ref 135–145)
TOTAL PROTEIN: 8.1 g/dL (ref 6.5–8.1)
Total Bilirubin: 0.5 mg/dL (ref 0.3–1.2)

## 2017-09-05 LAB — LIPID PANEL
CHOL/HDL RATIO: 5.1 ratio
CHOLESTEROL: 177 mg/dL (ref 0–200)
HDL: 35 mg/dL — AB (ref >40)
LDL Cholesterol: 121 mg/dL — ABNORMAL HIGH (ref 0–99)
Triglycerides: 105 mg/dL (ref <150)
VLDL: 21 mg/dL (ref 0–40)

## 2017-09-05 LAB — TSH: TSH: 1.052 u[IU]/mL (ref 0.350–4.500)

## 2017-09-06 LAB — HEMOGLOBIN A1C
HEMOGLOBIN A1C: 9.2 % — AB (ref 4.8–5.6)
Mean Plasma Glucose: 217.34 mg/dL

## 2017-10-14 ENCOUNTER — Telehealth: Payer: Self-pay | Admitting: Obstetrics and Gynecology

## 2017-10-14 NOTE — Telephone Encounter (Signed)
The patient called and stated that she would like to have a call back from Dr. Andreas Blower nurse. No other information was disclosed. Please advise.

## 2017-10-15 NOTE — Telephone Encounter (Signed)
Pt was called and stated that she has brown foul smelling discharge on her vaginal area. Pt also stated that she was having fibroid surgery on Friday. Pt was advised to inform them before her surgery. Pt was advise that she shouldn't be taking any antibiotics before her surgery.

## 2017-10-18 HISTORY — PX: MYOMECTOMY ABDOMINAL APPROACH: SUR870

## 2017-10-30 ENCOUNTER — Ambulatory Visit (INDEPENDENT_AMBULATORY_CARE_PROVIDER_SITE_OTHER): Payer: Medicaid Other | Admitting: Obstetrics and Gynecology

## 2017-10-30 ENCOUNTER — Encounter: Payer: Self-pay | Admitting: Obstetrics and Gynecology

## 2017-10-30 VITALS — BP 138/88 | HR 79 | Ht <= 58 in | Wt 188.2 lb

## 2017-10-30 DIAGNOSIS — B373 Candidiasis of vulva and vagina: Secondary | ICD-10-CM | POA: Diagnosis not present

## 2017-10-30 DIAGNOSIS — Z9889 Other specified postprocedural states: Secondary | ICD-10-CM | POA: Diagnosis not present

## 2017-10-30 DIAGNOSIS — B3731 Acute candidiasis of vulva and vagina: Secondary | ICD-10-CM

## 2017-10-30 MED ORDER — NYSTATIN 100000 UNIT/GM EX CREA: 1.0000 "application " | TOPICAL_CREAM | Freq: Two times a day (BID) | CUTANEOUS | 0 refills | Status: DC

## 2017-10-30 MED ORDER — FLUCONAZOLE 150 MG PO TABS: 150.0000 mg | ORAL_TABLET | Freq: Once | ORAL | 3 refills | Status: AC

## 2017-10-30 NOTE — Progress Notes (Signed)
    GYNECOLOGY PROGRESS NOTE  Subjective:    Patient ID: Robin Vazquez, female    DOB: 04/22/1986, 32 y.o.   MRN: [id number]  HPI  Patient is a 32 y.o. G17P0010 female who presents for complaints of possible yeast infection.  Notes that she has been noting external vaginal irritation for the past 2-3 weeks. She reports her PCP prescribed her fluconazole which she used, but does not feel like it helped.  Notes a thick pasty white discharge on external genitalia that she notes when wiping, and sometimes sees in the toile when she urinates. Denies internal vaginal irritation.   Patient also desires to have her incision checked.  She is ~ 1 week s/p abdominal myomectomy performed at [facility]. Denies any complaints regarding her incision.   The following portions of the patient's history were reviewed and updated as appropriate: allergies, current medications, past family history, past medical history, past social history, past surgical history and problem list.  Review of Systems Pertinent items noted in HPI and remainder of comprehensive ROS otherwise negative.   Objective:   Blood pressure 138/88, pulse 79, height 4\' 7"  (1.397 m), weight 188 lb 3.2 oz (85.4 kg), last menstrual period 10/01/2017. General appearance: alert and no distress Abdomen: soft, non-tender; bowel sounds normal; no masses,  no organomegaly and incision site healing well, no erythema or drainage Pelvic: external genitalia with thick pasty white discharge in groin region and labia majora.  Internal exam not performed.    Assessment:   Yeast vulvitis Abdominal surgery  Plan:   - Incision appears well, no drainage.  Has post-op follow up next week with [facility].  - Yeast vulvitis - patient with yeast infection externally. Will treat with PO Diflucan dose x1 and prescribe Nystatin cream x 1 week. To return if symptoms worsen.    Rubie Maid, MD [facility]

## 2017-10-30 NOTE — Progress Notes (Signed)
Pt stated that she may have a yeast infection.

## 2017-10-31 ENCOUNTER — Encounter: Payer: Self-pay | Admitting: Obstetrics and Gynecology

## 2017-11-29 ENCOUNTER — Telehealth: Payer: Self-pay | Admitting: Obstetrics and Gynecology

## 2017-11-29 NOTE — Telephone Encounter (Signed)
Patient called stating she continues to get an external yeast infection around her vaginal area. The cream she was given helps for a short time but it comes back after a few days. She stated it started around the time of a medication change. She would like a call back. Thanks

## 2017-12-02 NOTE — Telephone Encounter (Signed)
Spoke with pt and went over her symptoms. Pt stated that she took the cream prescribed by [facility] for 2 weeks after she stopped the itching started again along with the skin peeling in the vaginal area. Pt stated that she think it may be the new medication she is taking Jardiance. Pt was advised that [facility] would be aware of her current condition and she would receive a call as soon as [facility] advise on what needs to be done to treat her.

## 2017-12-04 NOTE — Telephone Encounter (Signed)
After speaking with [facility] about the pt. Pt was called no answer LM to call the office to inform her of the information that was given by [facility].

## 2017-12-05 NOTE — Telephone Encounter (Signed)
Pt was called and informed of what AC had suggested for her to do: She should get an over the counter hydrocortisone cream and use it twice a day. She should also treat her skin with something like Aquaphor (OTC) or vaseline after use of the steroid cream. If it continues she shoud talk to her PCP about the side effects and if she should continue taking the medication.  Pt stated that she would try this to see if it helps.

## 2018-02-03 ENCOUNTER — Other Ambulatory Visit
Admission: RE | Admit: 2018-02-03 | Discharge: 2018-02-03 | Disposition: A | Discharge status: Home / Self Care | Payer: Medicaid Other | Source: Ambulatory Visit | Attending: Nurse Practitioner | Admitting: Nurse Practitioner

## 2018-02-03 DIAGNOSIS — I10 Essential (primary) hypertension: Secondary | ICD-10-CM | POA: Insufficient documentation

## 2018-02-03 DIAGNOSIS — R5383 Other fatigue: Secondary | ICD-10-CM | POA: Diagnosis not present

## 2018-02-03 DIAGNOSIS — E119 Type 2 diabetes mellitus without complications: Secondary | ICD-10-CM | POA: Insufficient documentation

## 2018-02-03 DIAGNOSIS — D509 Iron deficiency anemia, unspecified: Secondary | ICD-10-CM | POA: Insufficient documentation

## 2018-02-03 DIAGNOSIS — E785 Hyperlipidemia, unspecified: Secondary | ICD-10-CM | POA: Insufficient documentation

## 2018-02-03 LAB — COMPREHENSIVE METABOLIC PANEL
ALBUMIN: 4.1 g/dL (ref 3.5–5.0)
ALT: 20 U/L (ref 0–44)
ANION GAP: 8 (ref 5–15)
AST: 18 U/L (ref 15–41)
Alkaline Phosphatase: 84 U/L (ref 38–126)
BUN: 10 mg/dL (ref 6–20)
CHLORIDE: 108 mmol/L (ref 98–111)
CO2: 23 mmol/L (ref 22–32)
Calcium: 8.8 mg/dL — ABNORMAL LOW (ref 8.9–10.3)
Creatinine, Ser: <0.3 mg/dL — ABNORMAL LOW (ref 0.44–1.00)
GLUCOSE: 181 mg/dL — AB (ref 70–99)
POTASSIUM: 3.9 mmol/L (ref 3.5–5.1)
SODIUM: 139 mmol/L (ref 135–145)
Total Bilirubin: 0.7 mg/dL (ref 0.3–1.2)
Total Protein: 7.6 g/dL (ref 6.5–8.1)

## 2018-02-03 LAB — TSH: TSH: 1.144 u[IU]/mL (ref 0.350–4.500)

## 2018-02-03 LAB — CBC
HCT: 40.7 % (ref 35.0–47.0)
Hemoglobin: 13.4 g/dL (ref 12.0–16.0)
MCH: 27.1 pg (ref 26.0–34.0)
MCHC: 33 g/dL (ref 32.0–36.0)
MCV: 82.3 fL (ref 80.0–100.0)
PLATELETS: 364 10*3/uL (ref 150–440)
RBC: 4.95 MIL/uL (ref 3.80–5.20)
RDW: 14.1 % (ref 11.5–14.5)
WBC: 6.5 10*3/uL (ref 3.6–11.0)

## 2018-02-03 LAB — LIPID PANEL
Cholesterol: 104 mg/dL (ref 0–200)
HDL: 31 mg/dL — AB (ref >40)
LDL CALC: 64 mg/dL (ref 0–99)
Total CHOL/HDL Ratio: 3.4 RATIO
Triglycerides: 43 mg/dL (ref <150)
VLDL: 9 mg/dL (ref 0–40)

## 2018-02-03 LAB — HEMOGLOBIN A1C
HEMOGLOBIN A1C: 8.3 % — AB (ref 4.8–5.6)
MEAN PLASMA GLUCOSE: 191.51 mg/dL

## 2018-03-06 ENCOUNTER — Encounter: Payer: Medicaid Other | Admitting: Obstetrics and Gynecology

## 2018-03-07 ENCOUNTER — Other Ambulatory Visit (HOSPITAL_COMMUNITY)
Admission: RE | Admit: 2018-03-07 | Discharge: 2018-03-07 | Disposition: A | Discharge status: Home / Self Care | Payer: Medicaid Other | Source: Ambulatory Visit | Attending: Obstetrics and Gynecology | Admitting: Obstetrics and Gynecology

## 2018-03-07 ENCOUNTER — Encounter: Payer: Self-pay | Admitting: Obstetrics and Gynecology

## 2018-03-07 ENCOUNTER — Ambulatory Visit (INDEPENDENT_AMBULATORY_CARE_PROVIDER_SITE_OTHER): Payer: Medicaid Other | Admitting: Obstetrics and Gynecology

## 2018-03-07 VITALS — BP 135/92 | HR 73 | Ht <= 58 in | Wt 198.1 lb

## 2018-03-07 DIAGNOSIS — Z86718 Personal history of other venous thrombosis and embolism: Secondary | ICD-10-CM

## 2018-03-07 DIAGNOSIS — Z0000 Encounter for general adult medical examination without abnormal findings: Secondary | ICD-10-CM

## 2018-03-07 DIAGNOSIS — R87612 Low grade squamous intraepithelial lesion on cytologic smear of cervix (LGSIL): Secondary | ICD-10-CM

## 2018-03-07 DIAGNOSIS — Z01419 Encounter for gynecological examination (general) (routine) without abnormal findings: Secondary | ICD-10-CM

## 2018-03-07 DIAGNOSIS — I10 Essential (primary) hypertension: Secondary | ICD-10-CM | POA: Diagnosis not present

## 2018-03-07 DIAGNOSIS — E119 Type 2 diabetes mellitus without complications: Secondary | ICD-10-CM | POA: Diagnosis not present

## 2018-03-07 DIAGNOSIS — Q743 Arthrogryposis multiplex congenita: Secondary | ICD-10-CM

## 2018-03-07 DIAGNOSIS — N898 Other specified noninflammatory disorders of vagina: Secondary | ICD-10-CM

## 2018-03-07 NOTE — Patient Instructions (Signed)

## 2018-03-07 NOTE — Progress Notes (Signed)
PT is present today for her annual exam. Pt stated that she has been doing self-breast exams monthly sometime. Pt stated that she is doing well, but wanted to be checked to make sure the infection she had was cleared up. No itching or burning. No problems or concerns.

## 2018-03-08 ENCOUNTER — Encounter: Payer: Self-pay | Admitting: Obstetrics and Gynecology

## 2018-03-08 NOTE — Progress Notes (Signed)
GYNECOLOGY ANNUAL PHYSICAL EXAM PROGRESS NOTE  Subjective:    Robin Vazquez is a 32 y.o. [id number] female who presents for an annual exam.  The patient is not currently sexually active. The patient wears seatbelts: yes. The patient participates in regular exercise: no. Has the patient ever been transfused or tattooed?: no. The patient reports that there is not domestic violence in her life.    The patient has complaints today:  1)  Patient thinks that she may be getting another yeast infection. Notes that she has had some mild occasional itching in the vagina and increased discharge. Symptoms have been ongoing for the past several days.    Gynecologic History Menarche age: 11 Patient's last menstrual period was 02/21/2018. Contraception: abstinence History of STI's: None Last Pap:10/2016. Results were: abnormal, LGSIL.  Denies prior h/o abnormal pap smears.    OB History  Gravida Para Term Preterm AB Living  1       1    SAB TAB Ectopic Multiple Live Births  1            # Outcome Date GA Lbr Len/2nd Weight Sex Delivery Anes PTL Lv  1 SAB 10/2016 [redacted]w[redacted]d           Past Medical History:  Diagnosis Date  . Acute superficial venous thrombosis of left lower extremity 02/2016  . Anemia   . Congenital multiple arthrogryposis   . Diabetes mellitus without complication ([place])   . Herpes    genitial  . History of uterine fibroid 09/2015  . Hyperlipemia   . Hypertension   . Obesity     Past Surgical History:  Procedure Laterality Date  . joint     on legs and feet  . MYOMECTOMY ABDOMINAL APPROACH  10/18/2017   performed at [facility] History  Adopted: Yes  Family history unknown: Yes    Social History   Socioeconomic History  . Marital status: Single    Spouse name: Not on file  . Number of children: Not on file  . Years of education: Not on file  . Highest education level: Not on file  Occupational History  . Not on file  Social Needs  . Financial resource  strain: Not on file  . Food insecurity:    Worry: Not on file    Inability: Not on file  . Transportation needs:    Medical: Not on file    Non-medical: Not on file  Tobacco Use  . Smoking status: Never Smoker  . Smokeless tobacco: Never Used  Substance and Sexual Activity  . Alcohol use: No  . Drug use: No  . Sexual activity: Not Currently    Birth control/protection: None  Lifestyle  . Physical activity:    Days per week: Not on file    Minutes per session: Not on file  . Stress: Not on file  Relationships  . Social connections:    Talks on phone: Not on file    Gets together: Not on file    Attends religious service: Not on file    Active member of club or organization: Not on file    Attends meetings of clubs or organizations: Not on file    Relationship status: Not on file  . Intimate partner violence:    Fear of current or ex partner: Not on file    Emotionally abused: Not on file    Physically abused: Not on file    Forced sexual activity:  Not on file  Other Topics Concern  . Not on file  Social History Narrative  . Not on file    Current Outpatient Medications on File Prior to Visit  Medication Sig Dispense Refill  . enalapril (VASOTEC) 5 MG tablet Take 5 mg by mouth daily.    Marland Kitchen glipiZIDE (GLUCOTROL) 10 MG tablet Take 10 mg by mouth daily before breakfast.     No current facility-administered medications on file prior to visit.     No Known Allergies   Review of Systems Constitutional: negative for chills, fatigue, fevers and sweats Eyes: negative for irritation, redness and visual disturbance Ears, nose, mouth, throat, and face: negative for hearing loss, nasal congestion, snoring and tinnitus Respiratory: negative for asthma, cough, sputum Cardiovascular: negative for chest pain, dyspnea, exertional chest pressure/discomfort, irregular heart beat, palpitations and syncope Gastrointestinal: negative for abdominal pain, change in bowel habits, nausea  and vomiting Genitourinary: negative for heavy menstrual periods, genital lesions, sexual problems, dysuria and urinary incontinence. Positive for mild vaginal irritation and discharge for several days Integument/breast: negative for breast lump, breast tenderness and nipple discharge Hematologic/lymphatic: negative for bleeding and easy bruising Musculoskeletal: negative for back pain and muscle weakness Neurologic: negative for dizziness, headaches, vertigo and weakness Endocrine: negative for diabetic symptoms including polydipsia, polyuria and skin dryness Allergic/Immunologic: negative for hay fever and urticaria      Objective:  Blood pressure (!) 135/92, pulse 73, height 4\' 7"  (1.397 m), weight 198 lb 1.6 oz (89.9 kg), last menstrual period 02/21/2018. Body mass index is 46.04 kg/m.  General Appearance:    Alert, cooperative, no distress, appears stated age, morbid obesity.   Head:    Normocephalic, without obvious abnormality, atraumatic  Eyes:    PERRL, conjunctiva/corneas clear, EOM's intact, both eyes  Ears:    Normal external ear canals, both ears  Nose:   Nares normal, septum midline, mucosa normal, no drainage or sinus tenderness  Throat:   Lips, mucosa, and tongue normal; teeth and gums normal  Neck:   Supple, symmetrical, trachea midline, no adenopathy; thyroid: no enlargement/tenderness/nodules; no carotid bruit or JVD  Back:     Symmetric, no curvature, ROM normal, no CVA tenderness  Lungs:     Clear to auscultation bilaterally, respirations unlabored  Chest Wall:    No tenderness or deformity   Heart:    Regular rate and rhythm, S1 and S2 normal, no murmur, rub or gallop  Breast Exam:    No tenderness, masses, or nipple abnormality  Abdomen:     Soft, non-tender, bowel sounds active all four quadrants, no masses, no organomegaly.    Genitalia:  External genitalia Vulva:- Normal. Bartholin's Gland- Bilateral- Normal. Perineum- Normal. Clitoris-  Normal. Introitus:Characteristics- Normal(However very narrow introitus with prominent pubic arch.). Discharge- None. Urethra:Characteristics- Normal. Speculum & Bimanual Vagina:No lesions. Vaginal Mucosa- No Atrophy, Relaxation. Scant thick white discharge. Note: Vaginal mucosa difficult to assess as patient very tense during exam and use of pediatric speculum suboptimal against body habitus. Cervix:Characteristics- Normal(Cervix difficult to assess as patient very tense during exam and use of pediatric speculum suboptimal against body habitus.). Uterus:Characteristics-~ 12-14 week sized. Position- Midposition. Mobile. Normal Contour Adnexa:Characteristics- Bilateral- Normal. However difficult to assess due to body habitus.   Rectal:    Normal external sphincter.  No hemorrhoids appreciated. Internal exam not done.   Extremities:   Joint contraction of knees and elbows bilaterally, shortened upper and lower limbs. Atraumatic, no cyanosis or edema  Pulses:   2+ and symmetric all  extremities  Skin:   Skin color, texture, turgor normal, no rashes.   Lymph nodes:   Cervical, supraclavicular, and axillary nodes normal  Neurologic:   CNII-XII intact, normal strength, sensation and reflexes throughout    Labs:  Lab Results  Component Value Date   WBC 6.5 02/03/2018   HGB 13.4 02/03/2018   HCT 40.7 02/03/2018   MCV 82.3 02/03/2018   PLT 364 02/03/2018    Lab Results  Component Value Date   CREATININE <0.30 (L) 02/03/2018   BUN 10 02/03/2018   NA 139 02/03/2018   K 3.9 02/03/2018   CL 108 02/03/2018   CO2 23 02/03/2018    Lab Results  Component Value Date   ALT 20 02/03/2018   AST 18 02/03/2018   ALKPHOS 84 02/03/2018   BILITOT 0.7 02/03/2018    Lab Results  Component Value Date   TSH 1.144 02/03/2018     Lab Results  Component Value Date   CHOL 104 02/03/2018   HDL 31 (L) 02/03/2018   LDLCALC 64 02/03/2018   TRIG 43 02/03/2018   CHOLHDL 3.4  02/03/2018    Lab Results  Component Value Date   HGBA1C 8.3 (H) 02/03/2018     Assessment:   Annual gynecologic exam.   HTN DM Congenital arthogryposis H/o DVT (superficial) H/o abnormal pap smear (LGSIL) Vaginal discharge  Plan:    Labs: Reviewed, recently performed by PCP.  Breast self exam technique reviewed and patient encouraged to perform self-exam monthly. Discussed healthy lifestyle modifications.  STD screening ordered at patient's request.  Pap smear: LGSIL pap smear last year.  Repeated again today.  HTN controlled. Notes complaince with medications. Managed by PCP. DM - currently controlled on medications (per patient).  Managed by PCP.  DM currently managed by PCP.  Contraception: None currently. Maintaining abstinence. Discussed progesterone-only methods are available if and when she becomes sexually active again in light of her h/o superficial DVT.  Vaginal irritation and discharge, will perform vaginitis screen with pap smear.  Declined flu vaccine.  RTC in 1 year for annual exam.   Rubie Maid, MD [facility]

## 2018-03-12 LAB — CYTOLOGY - PAP
ADEQUACY: ABSENT
Bacterial vaginitis: NEGATIVE
Candida vaginitis: POSITIVE — AB
Diagnosis: NEGATIVE
HPV (WINDOPATH): DETECTED — AB
HPV 16/18/45 genotyping: NEGATIVE
Trichomonas: NEGATIVE

## 2018-03-16 ENCOUNTER — Other Ambulatory Visit: Payer: Self-pay | Admitting: Obstetrics and Gynecology

## 2018-03-16 MED ORDER — BORIC ACID CRYS
600.0000 mg | CRYSTALS | Freq: Every day | 0 refills | Status: DC
Start: 2018-03-16 — End: 2018-03-18

## 2018-03-17 ENCOUNTER — Other Ambulatory Visit: Payer: Self-pay

## 2018-03-18 ENCOUNTER — Other Ambulatory Visit: Payer: Self-pay

## 2018-03-18 MED ORDER — BORIC ACID CRYS: 600.0000 mg | CRYSTALS | Freq: Every day | 0 refills | Status: AC

## 2018-05-09 ENCOUNTER — Other Ambulatory Visit
Admission: RE | Admit: 2018-05-09 | Discharge: 2018-05-09 | Disposition: A | Discharge status: Home / Self Care | Payer: Medicaid Other | Attending: Nurse Practitioner | Admitting: Nurse Practitioner

## 2018-05-09 DIAGNOSIS — I10 Essential (primary) hypertension: Secondary | ICD-10-CM | POA: Diagnosis not present

## 2018-05-09 DIAGNOSIS — E119 Type 2 diabetes mellitus without complications: Secondary | ICD-10-CM | POA: Insufficient documentation

## 2018-05-09 DIAGNOSIS — R509 Fever, unspecified: Secondary | ICD-10-CM | POA: Insufficient documentation

## 2018-05-09 DIAGNOSIS — E785 Hyperlipidemia, unspecified: Secondary | ICD-10-CM | POA: Insufficient documentation

## 2018-05-09 DIAGNOSIS — R5383 Other fatigue: Secondary | ICD-10-CM | POA: Insufficient documentation

## 2018-05-09 LAB — CBC WITH DIFFERENTIAL/PLATELET
Abs Immature Granulocytes: 0.01 10*3/uL (ref 0.00–0.07)
BASOS ABS: 0 10*3/uL (ref 0.0–0.1)
BASOS PCT: 1 %
EOS ABS: 0.1 10*3/uL (ref 0.0–0.5)
Eosinophils Relative: 2 %
HCT: 41.8 % (ref 36.0–46.0)
Hemoglobin: 13.3 g/dL (ref 12.0–15.0)
IMMATURE GRANULOCYTES: 0 %
LYMPHS ABS: 3.3 10*3/uL (ref 0.7–4.0)
Lymphocytes Relative: 50 %
MCH: 26.1 pg (ref 26.0–34.0)
MCHC: 31.8 g/dL (ref 30.0–36.0)
MCV: 82.1 fL (ref 80.0–100.0)
Monocytes Absolute: 0.5 10*3/uL (ref 0.1–1.0)
Monocytes Relative: 7 %
NEUTROS PCT: 40 %
NRBC: 0 % (ref 0.0–0.2)
Neutro Abs: 2.7 10*3/uL (ref 1.7–7.7)
PLATELETS: 341 10*3/uL (ref 150–400)
RBC: 5.09 MIL/uL (ref 3.87–5.11)
RDW: 13.3 % (ref 11.5–15.5)
WBC: 6.6 10*3/uL (ref 4.0–10.5)

## 2018-05-09 LAB — COMPREHENSIVE METABOLIC PANEL
ALT: 16 U/L (ref 0–44)
AST: 16 U/L (ref 15–41)
Albumin: 4 g/dL (ref 3.5–5.0)
Alkaline Phosphatase: 69 U/L (ref 38–126)
Anion gap: 8 (ref 5–15)
BUN: 11 mg/dL (ref 6–20)
CALCIUM: 8.7 mg/dL — AB (ref 8.9–10.3)
CHLORIDE: 107 mmol/L (ref 98–111)
CO2: 22 mmol/L (ref 22–32)
CREATININE: 0.38 mg/dL — AB (ref 0.44–1.00)
GFR calc Af Amer: >60 mL/min (ref >60)
Glucose, Bld: 243 mg/dL — ABNORMAL HIGH (ref 70–99)
Potassium: 3.4 mmol/L — ABNORMAL LOW (ref 3.5–5.1)
Sodium: 137 mmol/L (ref 135–145)
Total Bilirubin: 0.5 mg/dL (ref 0.3–1.2)
Total Protein: 7.7 g/dL (ref 6.5–8.1)

## 2018-05-09 LAB — LIPID PANEL
CHOLESTEROL: 182 mg/dL (ref 0–200)
HDL: 34 mg/dL — ABNORMAL LOW (ref >40)
LDL CALC: 140 mg/dL — AB (ref 0–99)
Total CHOL/HDL Ratio: 5.4 RATIO
Triglycerides: 41 mg/dL (ref <150)
VLDL: 8 mg/dL (ref 0–40)

## 2018-05-09 LAB — HEMOGLOBIN A1C
HEMOGLOBIN A1C: 10.2 % — AB (ref 4.8–5.6)
MEAN PLASMA GLUCOSE: 246.04 mg/dL

## 2018-05-09 LAB — TSH: TSH: 1.075 u[IU]/mL (ref 0.350–4.500)

## 2018-07-21 ENCOUNTER — Telehealth: Payer: Self-pay | Admitting: Obstetrics and Gynecology

## 2018-07-21 DIAGNOSIS — N898 Other specified noninflammatory disorders of vagina: Secondary | ICD-10-CM

## 2018-07-21 MED ORDER — FLUCONAZOLE 150 MG PO TABS
150.0000 mg | ORAL_TABLET | Freq: Every day | ORAL | 0 refills | Status: DC
Start: 2018-07-21 — End: 2021-05-31

## 2018-07-21 NOTE — Telephone Encounter (Signed)
The patient called and stated that she needs a prescription sent into her pharmacy for a yeast infection. Please advise.  °

## 2018-07-21 NOTE — Telephone Encounter (Signed)
Prescription sent to the pharmacy and patient notified.  

## 2018-09-08 ENCOUNTER — Other Ambulatory Visit
Admission: RE | Admit: 2018-09-08 | Discharge: 2018-09-08 | Disposition: A | Discharge status: Home / Self Care | Payer: Medicaid Other | Attending: Nurse Practitioner | Admitting: Nurse Practitioner

## 2018-09-08 DIAGNOSIS — E119 Type 2 diabetes mellitus without complications: Secondary | ICD-10-CM | POA: Diagnosis not present

## 2018-09-08 DIAGNOSIS — E785 Hyperlipidemia, unspecified: Secondary | ICD-10-CM | POA: Insufficient documentation

## 2018-09-08 DIAGNOSIS — R5383 Other fatigue: Secondary | ICD-10-CM | POA: Insufficient documentation

## 2018-09-08 DIAGNOSIS — I10 Essential (primary) hypertension: Secondary | ICD-10-CM | POA: Insufficient documentation

## 2018-09-08 LAB — CBC
HCT: 41.7 % (ref 36.0–46.0)
Hemoglobin: 12.8 g/dL (ref 12.0–15.0)
MCH: 24.2 pg — ABNORMAL LOW (ref 26.0–34.0)
MCHC: 30.7 g/dL (ref 30.0–36.0)
MCV: 79 fL — ABNORMAL LOW (ref 80.0–100.0)
Platelets: 386 10*3/uL (ref 150–400)
RBC: 5.28 MIL/uL — ABNORMAL HIGH (ref 3.87–5.11)
RDW: 14.3 % (ref 11.5–15.5)
WBC: 6.9 10*3/uL (ref 4.0–10.5)
nRBC: 0 % (ref 0.0–0.2)

## 2018-09-09 LAB — HEMOGLOBIN A1C
Hgb A1c MFr Bld: 8.9 % — ABNORMAL HIGH (ref 4.8–5.6)
Mean Plasma Glucose: 209 mg/dL

## 2018-12-15 ENCOUNTER — Other Ambulatory Visit
Admission: RE | Admit: 2018-12-15 | Discharge: 2018-12-15 | Disposition: A | Discharge status: Home / Self Care | Payer: Medicaid Other | Source: Ambulatory Visit | Attending: Nurse Practitioner | Admitting: Nurse Practitioner

## 2018-12-15 DIAGNOSIS — I10 Essential (primary) hypertension: Secondary | ICD-10-CM | POA: Insufficient documentation

## 2018-12-15 DIAGNOSIS — E559 Vitamin D deficiency, unspecified: Secondary | ICD-10-CM | POA: Insufficient documentation

## 2018-12-15 DIAGNOSIS — E119 Type 2 diabetes mellitus without complications: Secondary | ICD-10-CM | POA: Diagnosis not present

## 2018-12-15 DIAGNOSIS — E785 Hyperlipidemia, unspecified: Secondary | ICD-10-CM | POA: Insufficient documentation

## 2018-12-15 DIAGNOSIS — E669 Obesity, unspecified: Secondary | ICD-10-CM | POA: Diagnosis not present

## 2018-12-15 LAB — COMPREHENSIVE METABOLIC PANEL
ALT: 18 U/L (ref 0–44)
AST: 16 U/L (ref 15–41)
Albumin: 4.3 g/dL (ref 3.5–5.0)
Alkaline Phosphatase: 78 U/L (ref 38–126)
Anion gap: 11 (ref 5–15)
BUN: 11 mg/dL (ref 6–20)
CO2: 21 mmol/L — ABNORMAL LOW (ref 22–32)
Calcium: 9.2 mg/dL (ref 8.9–10.3)
Chloride: 108 mmol/L (ref 98–111)
Creatinine, Ser: <0.3 mg/dL — ABNORMAL LOW (ref 0.44–1.00)
Glucose, Bld: 117 mg/dL — ABNORMAL HIGH (ref 70–99)
Potassium: 3.6 mmol/L (ref 3.5–5.1)
Sodium: 140 mmol/L (ref 135–145)
Total Bilirubin: 0.5 mg/dL (ref 0.3–1.2)
Total Protein: 8.2 g/dL — ABNORMAL HIGH (ref 6.5–8.1)

## 2018-12-15 LAB — TSH: TSH: 1.305 u[IU]/mL (ref 0.350–4.500)

## 2018-12-15 LAB — CBC WITH DIFFERENTIAL/PLATELET
Abs Immature Granulocytes: 0.04 10*3/uL (ref 0.00–0.07)
Basophils Absolute: 0.1 10*3/uL (ref 0.0–0.1)
Basophils Relative: 1 %
Eosinophils Absolute: 0.1 10*3/uL (ref 0.0–0.5)
Eosinophils Relative: 1 %
HCT: 39 % (ref 36.0–46.0)
Hemoglobin: 11.9 g/dL — ABNORMAL LOW (ref 12.0–15.0)
Immature Granulocytes: 0 %
Lymphocytes Relative: 44 %
Lymphs Abs: 4.9 10*3/uL — ABNORMAL HIGH (ref 0.7–4.0)
MCH: 22.8 pg — ABNORMAL LOW (ref 26.0–34.0)
MCHC: 30.5 g/dL (ref 30.0–36.0)
MCV: 74.9 fL — ABNORMAL LOW (ref 80.0–100.0)
Monocytes Absolute: 0.6 10*3/uL (ref 0.1–1.0)
Monocytes Relative: 5 %
Neutro Abs: 5.4 10*3/uL (ref 1.7–7.7)
Neutrophils Relative %: 49 %
Platelets: 494 10*3/uL — ABNORMAL HIGH (ref 150–400)
RBC: 5.21 MIL/uL — ABNORMAL HIGH (ref 3.87–5.11)
RDW: 15 % (ref 11.5–15.5)
WBC: 11 10*3/uL — ABNORMAL HIGH (ref 4.0–10.5)
nRBC: 0 % (ref 0.0–0.2)

## 2018-12-15 LAB — HEMOGLOBIN A1C
Hgb A1c MFr Bld: 8.9 % — ABNORMAL HIGH (ref 4.8–5.6)
Mean Plasma Glucose: 208.73 mg/dL

## 2018-12-15 LAB — LIPID PANEL
Cholesterol: 123 mg/dL (ref 0–200)
HDL: 35 mg/dL — ABNORMAL LOW (ref >40)
LDL Cholesterol: 77 mg/dL (ref 0–99)
Total CHOL/HDL Ratio: 3.5 RATIO
Triglycerides: 55 mg/dL (ref <150)
VLDL: 11 mg/dL (ref 0–40)

## 2018-12-16 LAB — VITAMIN D 25 HYDROXY (VIT D DEFICIENCY, FRACTURES): Vit D, 25-Hydroxy: 12.7 ng/mL — ABNORMAL LOW (ref 30.0–100.0)

## 2019-01-11 IMAGING — MR MR PELVIS W/O CM
4 of 8 series · 19 of 48 positions shown · non-contrast
Comparison: Ultrasound 07/24/2016

CLINICAL DATA: Dysfunctional uterine bleeding and fibroids.

EXAM:
MRI PELVIS WITHOUT CONTRAST
TECHNIQUE: Multiplanar multisequence MR imaging of the pelvis was performed. No
intravenous contrast was administered.

[Series 4: T2 · coronal · 6.0mm · 0.70mm/px · 5 of 32 slices shown (1 of 3)]
[im 1/32]
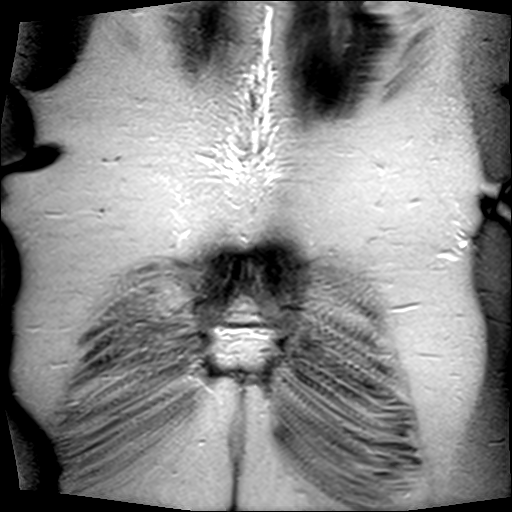
[im 8/32]
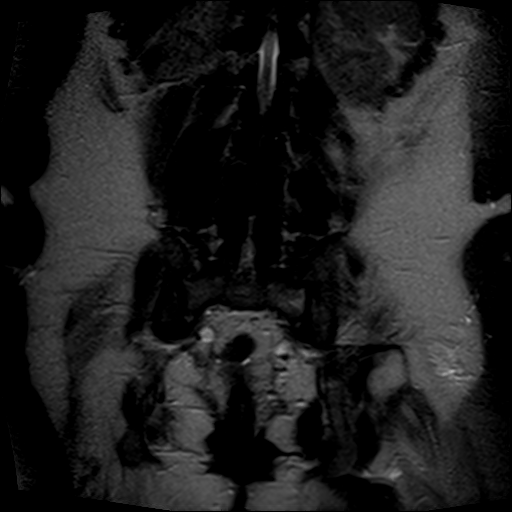
[im 16/32]
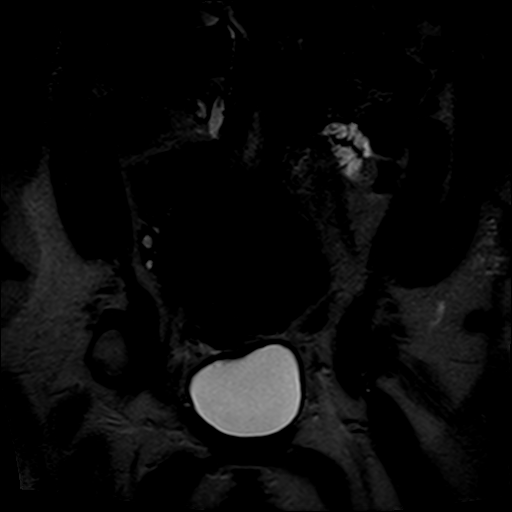
[im 24/32]
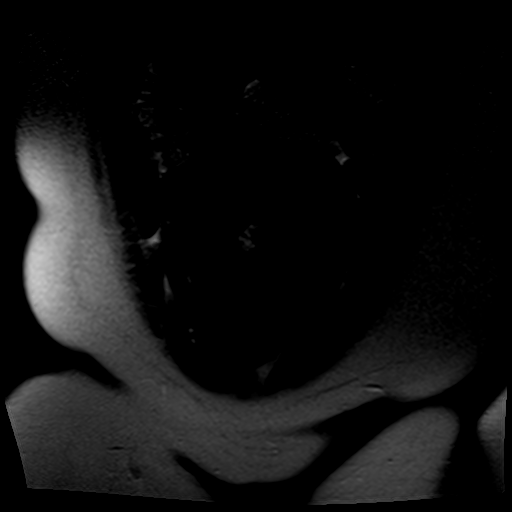
[im 32/32]
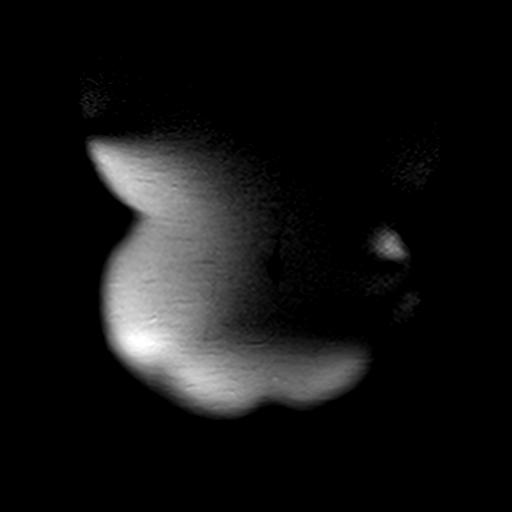

[Series 5: T2 · axial · 4.0mm · 0.57mm/px · z∈[-77,+182]mm · 8 of 55 slices shown (2 of 3)]
[im 1/55]
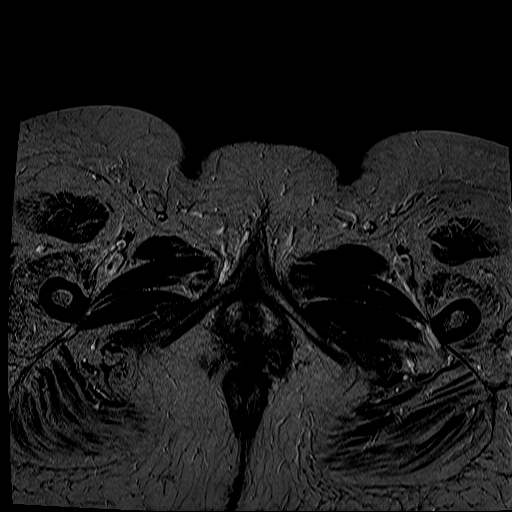
[im 8/55]
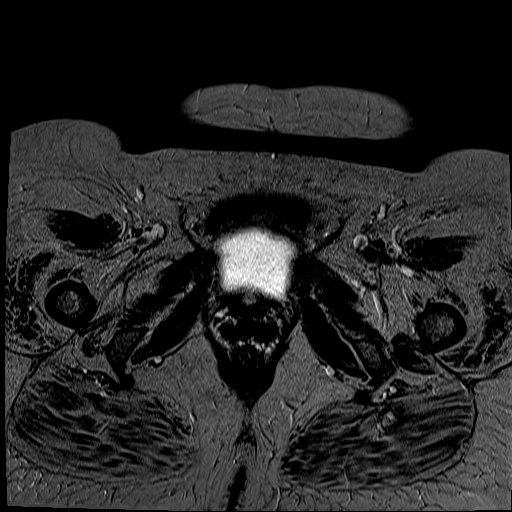
[im 16/55]
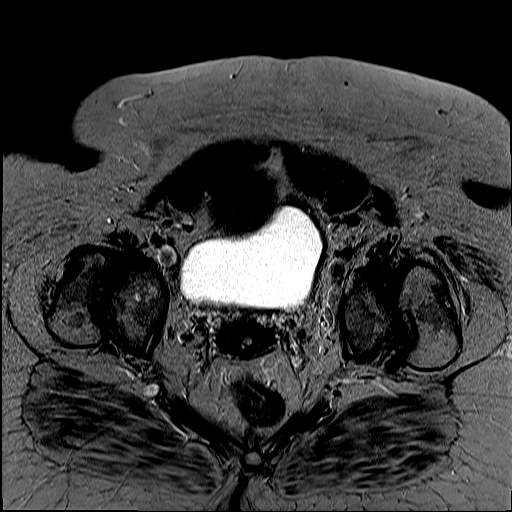
[im 24/55]
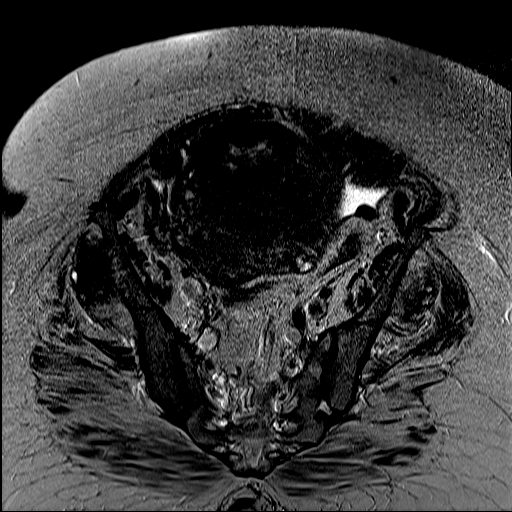
[im 31/55]
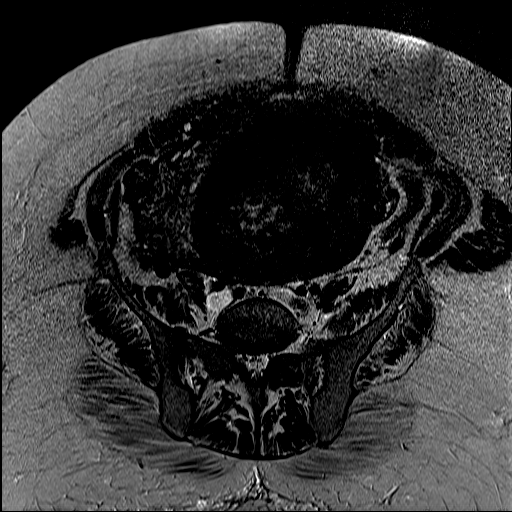
[im 39/55]
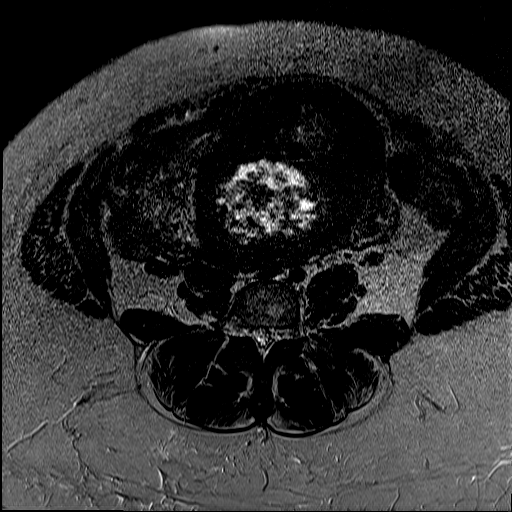
[im 47/55]
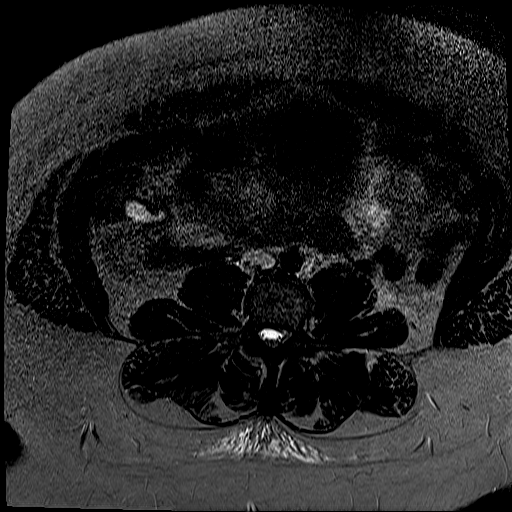
[im 55/55]
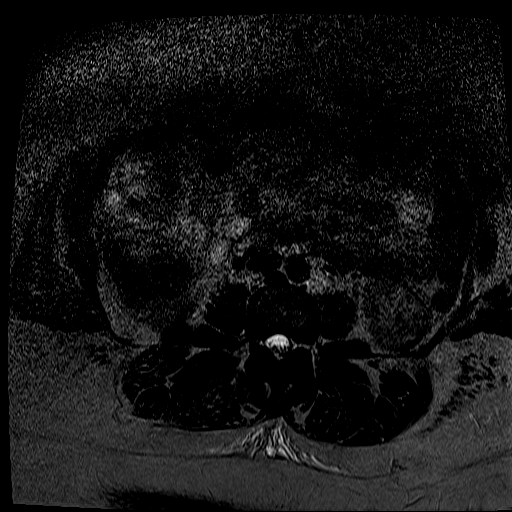

[Series 6: T2 fat-sat · axial · 4.0mm · 0.57mm/px · z∈[-44,+144]mm · 3 of 55 slices shown]
[im 8/55]
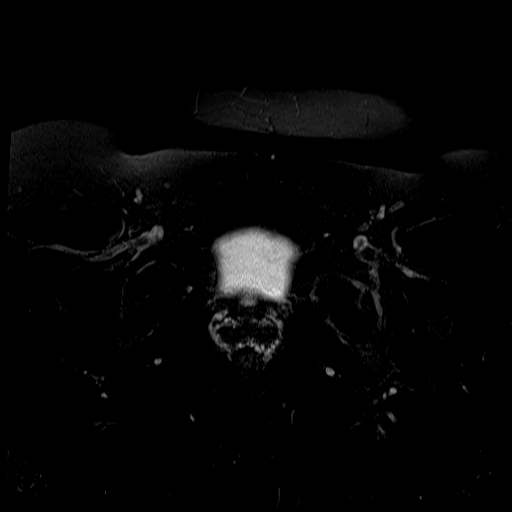
[im 31/55]
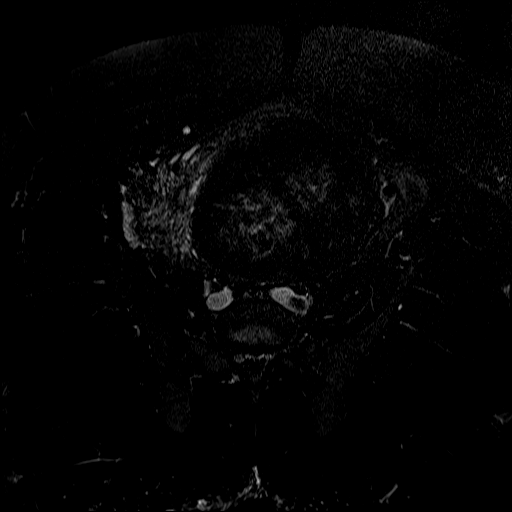
[im 47/55]
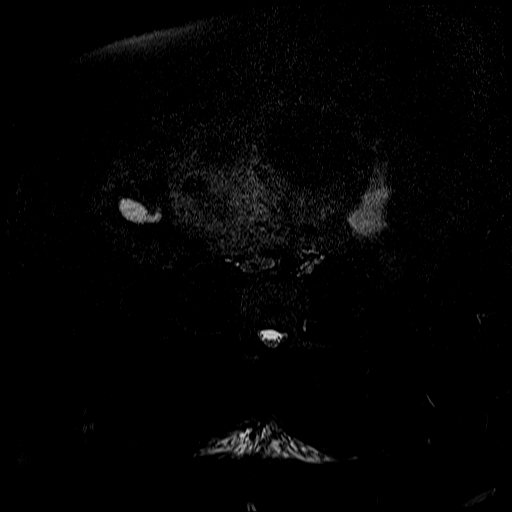

[Series 7: T2 · sagittal · 5.0mm · 0.51mm/px · 3 of 33 slices shown (3 of 3)]
[im 1/33]
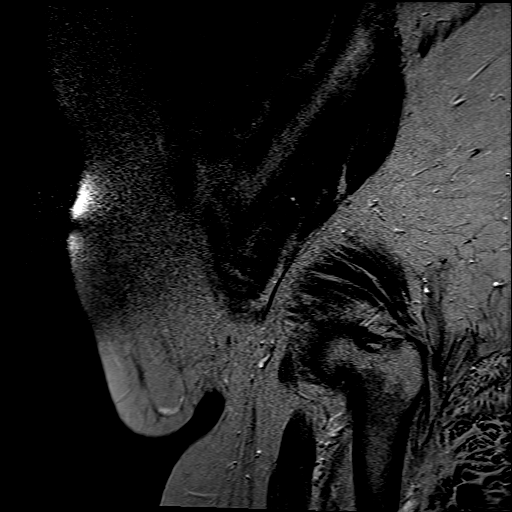
[im 22/33]
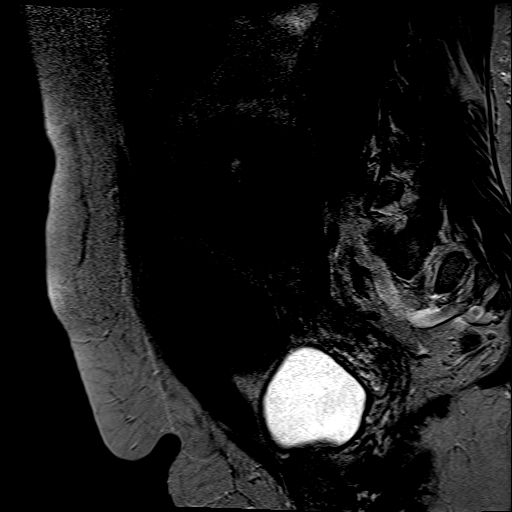
[im 33/33]
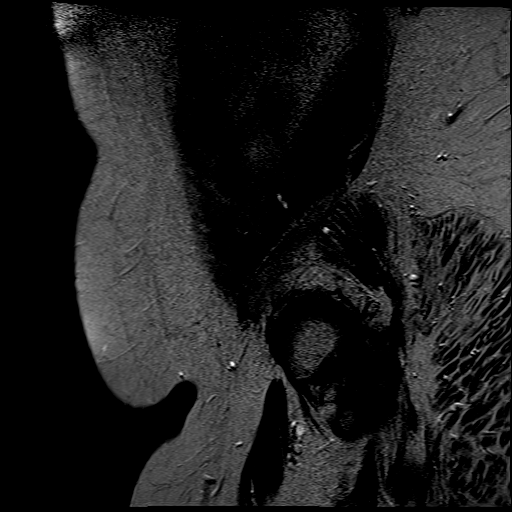

[19 of 48 positions shown; findings below may reference images not displayed]

FINDINGS: Markedly enlarged fibroid uterus measuring 18.9 x 15.9 x 9.8 cm. The
largest degenerated fibroid in the right myometrium measures 11.7 x
11.1 x 10 cm. The second largest fibroid is in the lower uterine
segment anteriorly and measures 6.7 x 4.4 x 6.7 cm. 4 cm
exophytic/serosal fibroid at the fundus. Numerous other degenerated
and not degenerated fibroids are noted.

The endometrium is somewhat compressed and displaced to the right
side but appears normal. The cervix is somewhat elongated. Normal
cervical stroma. Both ovaries appear normal.

No pelvic adenopathy.  The bladder appears normal.

Severe bilateral hip dysplasia.
IMPRESSION: 1. Markedly enlarged fibroid uterus.
2. Normal cervix and endometrium.
3. Normal ovaries.
4. No pelvic adenopathy.
5. Severe bilateral hip dysplasia.

## 2019-03-10 ENCOUNTER — Encounter: Payer: Medicaid Other | Admitting: Obstetrics and Gynecology

## 2019-04-07 ENCOUNTER — Other Ambulatory Visit: Payer: Self-pay

## 2019-04-07 ENCOUNTER — Other Ambulatory Visit
Admission: RE | Admit: 2019-04-07 | Discharge: 2019-04-07 | Disposition: A | Discharge status: Home / Self Care | Payer: Medicaid Other | Attending: Nurse Practitioner | Admitting: Nurse Practitioner

## 2019-04-07 DIAGNOSIS — I10 Essential (primary) hypertension: Secondary | ICD-10-CM | POA: Diagnosis not present

## 2019-04-07 DIAGNOSIS — E785 Hyperlipidemia, unspecified: Secondary | ICD-10-CM | POA: Diagnosis not present

## 2019-04-07 DIAGNOSIS — E119 Type 2 diabetes mellitus without complications: Secondary | ICD-10-CM | POA: Diagnosis not present

## 2019-04-07 DIAGNOSIS — E559 Vitamin D deficiency, unspecified: Secondary | ICD-10-CM | POA: Diagnosis not present

## 2019-04-07 LAB — LIPID PANEL
Cholesterol: 115 mg/dL (ref 0–200)
HDL: 38 mg/dL — ABNORMAL LOW (ref >40)
LDL Cholesterol: 71 mg/dL (ref 0–99)
Total CHOL/HDL Ratio: 3 RATIO
Triglycerides: 31 mg/dL (ref <150)
VLDL: 6 mg/dL (ref 0–40)

## 2019-04-07 LAB — COMPREHENSIVE METABOLIC PANEL
ALT: 16 U/L (ref 0–44)
AST: 18 U/L (ref 15–41)
Albumin: 4.1 g/dL (ref 3.5–5.0)
Alkaline Phosphatase: 71 U/L (ref 38–126)
Anion gap: 10 (ref 5–15)
BUN: 9 mg/dL (ref 6–20)
CO2: 21 mmol/L — ABNORMAL LOW (ref 22–32)
Calcium: 8.9 mg/dL (ref 8.9–10.3)
Chloride: 106 mmol/L (ref 98–111)
Creatinine, Ser: 0.37 mg/dL — ABNORMAL LOW (ref 0.44–1.00)
GFR calc Af Amer: >60 mL/min (ref >60)
GFR calc non Af Amer: >60 mL/min (ref >60)
Glucose, Bld: 111 mg/dL — ABNORMAL HIGH (ref 70–99)
Potassium: 3.3 mmol/L — ABNORMAL LOW (ref 3.5–5.1)
Sodium: 137 mmol/L (ref 135–145)
Total Bilirubin: 0.6 mg/dL (ref 0.3–1.2)
Total Protein: 8.1 g/dL (ref 6.5–8.1)

## 2019-04-07 LAB — TSH: TSH: 1.275 u[IU]/mL (ref 0.350–4.500)

## 2019-04-07 LAB — VITAMIN D 25 HYDROXY (VIT D DEFICIENCY, FRACTURES): Vit D, 25-Hydroxy: 10.75 ng/mL — ABNORMAL LOW (ref 30–100)

## 2019-04-07 LAB — HEMOGLOBIN A1C
Hgb A1c MFr Bld: 8.2 % — ABNORMAL HIGH (ref 4.8–5.6)
Mean Plasma Glucose: 188.64 mg/dL

## 2019-05-04 ENCOUNTER — Telehealth: Payer: Self-pay | Admitting: Obstetrics and Gynecology

## 2019-05-07 NOTE — Telephone Encounter (Signed)
na

## 2019-06-25 ENCOUNTER — Encounter: Payer: Medicaid Other | Admitting: Obstetrics and Gynecology

## 2019-07-20 ENCOUNTER — Other Ambulatory Visit
Admission: RE | Admit: 2019-07-20 | Discharge: 2019-07-20 | Disposition: A | Discharge status: Home / Self Care | Payer: Medicaid Other | Source: Ambulatory Visit | Attending: Nurse Practitioner | Admitting: Nurse Practitioner

## 2019-07-20 DIAGNOSIS — E785 Hyperlipidemia, unspecified: Secondary | ICD-10-CM | POA: Diagnosis not present

## 2019-07-20 DIAGNOSIS — E1165 Type 2 diabetes mellitus with hyperglycemia: Secondary | ICD-10-CM | POA: Insufficient documentation

## 2019-07-20 DIAGNOSIS — I10 Essential (primary) hypertension: Secondary | ICD-10-CM | POA: Insufficient documentation

## 2019-07-20 DIAGNOSIS — R5383 Other fatigue: Secondary | ICD-10-CM | POA: Diagnosis not present

## 2019-07-20 DIAGNOSIS — E559 Vitamin D deficiency, unspecified: Secondary | ICD-10-CM | POA: Insufficient documentation

## 2019-07-20 LAB — CBC WITH DIFFERENTIAL/PLATELET
Abs Immature Granulocytes: 0.02 10*3/uL (ref 0.00–0.07)
Basophils Absolute: 0 10*3/uL (ref 0.0–0.1)
Basophils Relative: 1 %
Eosinophils Absolute: 0.3 10*3/uL (ref 0.0–0.5)
Eosinophils Relative: 4 %
HCT: 35.5 % — ABNORMAL LOW (ref 36.0–46.0)
Hemoglobin: 10.5 g/dL — ABNORMAL LOW (ref 12.0–15.0)
Immature Granulocytes: 0 %
Lymphocytes Relative: 46 %
Lymphs Abs: 3.5 10*3/uL (ref 0.7–4.0)
MCH: 21.3 pg — ABNORMAL LOW (ref 26.0–34.0)
MCHC: 29.6 g/dL — ABNORMAL LOW (ref 30.0–36.0)
MCV: 72.2 fL — ABNORMAL LOW (ref 80.0–100.0)
Monocytes Absolute: 0.4 10*3/uL (ref 0.1–1.0)
Monocytes Relative: 6 %
Neutro Abs: 3.2 10*3/uL (ref 1.7–7.7)
Neutrophils Relative %: 43 %
Platelets: 394 10*3/uL (ref 150–400)
RBC: 4.92 MIL/uL (ref 3.87–5.11)
RDW: 16.5 % — ABNORMAL HIGH (ref 11.5–15.5)
WBC: 7.4 10*3/uL (ref 4.0–10.5)
nRBC: 0 % (ref 0.0–0.2)

## 2019-07-20 LAB — COMPREHENSIVE METABOLIC PANEL
ALT: 14 U/L (ref 0–44)
AST: 16 U/L (ref 15–41)
Albumin: 3.9 g/dL (ref 3.5–5.0)
Alkaline Phosphatase: 65 U/L (ref 38–126)
Anion gap: 9 (ref 5–15)
BUN: 11 mg/dL (ref 6–20)
CO2: 20 mmol/L — ABNORMAL LOW (ref 22–32)
Calcium: 8.5 mg/dL — ABNORMAL LOW (ref 8.9–10.3)
Chloride: 108 mmol/L (ref 98–111)
Creatinine, Ser: <0.3 mg/dL — ABNORMAL LOW (ref 0.44–1.00)
Glucose, Bld: 135 mg/dL — ABNORMAL HIGH (ref 70–99)
Potassium: 3.5 mmol/L (ref 3.5–5.1)
Sodium: 137 mmol/L (ref 135–145)
Total Bilirubin: 0.7 mg/dL (ref 0.3–1.2)
Total Protein: 7.5 g/dL (ref 6.5–8.1)

## 2019-07-20 LAB — LIPID PANEL
Cholesterol: 150 mg/dL (ref 0–200)
HDL: 37 mg/dL — ABNORMAL LOW (ref >40)
LDL Cholesterol: 106 mg/dL — ABNORMAL HIGH (ref 0–99)
Total CHOL/HDL Ratio: 4.1 RATIO
Triglycerides: 37 mg/dL (ref <150)
VLDL: 7 mg/dL (ref 0–40)

## 2019-07-20 LAB — TSH: TSH: 1.611 u[IU]/mL (ref 0.350–4.500)

## 2019-07-20 LAB — HEMOGLOBIN A1C
Hgb A1c MFr Bld: 8.1 % — ABNORMAL HIGH (ref 4.8–5.6)
Mean Plasma Glucose: 185.77 mg/dL

## 2019-07-20 LAB — VITAMIN D 25 HYDROXY (VIT D DEFICIENCY, FRACTURES): Vit D, 25-Hydroxy: 10.84 ng/mL — ABNORMAL LOW (ref 30–100)

## 2019-07-22 ENCOUNTER — Telehealth: Payer: Self-pay

## 2019-07-22 ENCOUNTER — Telehealth: Payer: Self-pay | Admitting: Obstetrics and Gynecology

## 2019-07-22 NOTE — Telephone Encounter (Signed)
Patient called saying she wanted a prescription called in for a possible yeast infection. I informed the patient that she may need an appointment in order to get a prescription called in. She informed me that she is wanting to use a new pharmacy. It's the [facility] in [place] on [address]. Could you please advise this patient.   -TC

## 2019-07-22 NOTE — Telephone Encounter (Signed)
Patient called.

## 2019-07-23 NOTE — Telephone Encounter (Signed)
Pt was called and went over sx. Pt stated having thick yellow discharge without odor. Pt stated that due to her physical disability she is unable to use a cream or vaginal insertion and need a pill form. Diflucan was sent to the pt's pharmacy. Pt was advised that if her sxs continue after 7 days of treatment she will need to be seen in the office for treatment. Pt stated that she understood.

## 2019-08-20 ENCOUNTER — Encounter: Payer: Medicaid Other | Admitting: Obstetrics and Gynecology

## 2019-09-02 ENCOUNTER — Telehealth: Payer: Self-pay | Admitting: Obstetrics and Gynecology

## 2019-09-02 NOTE — Telephone Encounter (Signed)
Pt called in and stated that she went to her pcp and that he wanted her to call her gyn and let her know that her iron is low. The pt is requesting a look at it and a call back. Please advise

## 2019-09-04 NOTE — Telephone Encounter (Signed)
Pt called and informed that her labs have been reviewed by provider and was advised for pt to take iron pills over the counter 1 tablet once a day. Also advise to take a stool softener as well. Pt voiced that she understood.

## 2019-09-23 ENCOUNTER — Encounter: Payer: Medicaid Other | Admitting: Obstetrics and Gynecology

## 2019-11-02 ENCOUNTER — Other Ambulatory Visit
Admission: RE | Admit: 2019-11-02 | Discharge: 2019-11-02 | Disposition: A | Discharge status: Home / Self Care | Payer: Medicaid Other | Attending: Nurse Practitioner | Admitting: Nurse Practitioner

## 2019-11-02 ENCOUNTER — Other Ambulatory Visit: Payer: Self-pay

## 2019-11-02 DIAGNOSIS — E785 Hyperlipidemia, unspecified: Secondary | ICD-10-CM | POA: Insufficient documentation

## 2019-11-02 DIAGNOSIS — R5383 Other fatigue: Secondary | ICD-10-CM | POA: Insufficient documentation

## 2019-11-02 DIAGNOSIS — E559 Vitamin D deficiency, unspecified: Secondary | ICD-10-CM | POA: Insufficient documentation

## 2019-11-02 DIAGNOSIS — E119 Type 2 diabetes mellitus without complications: Secondary | ICD-10-CM | POA: Diagnosis present

## 2019-11-02 DIAGNOSIS — I10 Essential (primary) hypertension: Secondary | ICD-10-CM | POA: Diagnosis not present

## 2019-11-02 LAB — CBC WITH DIFFERENTIAL/PLATELET
Abs Immature Granulocytes: 0.02 10*3/uL (ref 0.00–0.07)
Basophils Absolute: 0 10*3/uL (ref 0.0–0.1)
Basophils Relative: 1 %
Eosinophils Absolute: 0.2 10*3/uL (ref 0.0–0.5)
Eosinophils Relative: 2 %
HCT: 41.1 % (ref 36.0–46.0)
Hemoglobin: 12.5 g/dL (ref 12.0–15.0)
Immature Granulocytes: 0 %
Lymphocytes Relative: 45 %
Lymphs Abs: 3.5 10*3/uL (ref 0.7–4.0)
MCH: 23.5 pg — ABNORMAL LOW (ref 26.0–34.0)
MCHC: 30.4 g/dL (ref 30.0–36.0)
MCV: 77.1 fL — ABNORMAL LOW (ref 80.0–100.0)
Monocytes Absolute: 0.4 10*3/uL (ref 0.1–1.0)
Monocytes Relative: 5 %
Neutro Abs: 3.7 10*3/uL (ref 1.7–7.7)
Neutrophils Relative %: 47 %
Platelets: 349 10*3/uL (ref 150–400)
RBC: 5.33 MIL/uL — ABNORMAL HIGH (ref 3.87–5.11)
RDW: 20 % — ABNORMAL HIGH (ref 11.5–15.5)
WBC: 7.8 10*3/uL (ref 4.0–10.5)
nRBC: 0 % (ref 0.0–0.2)

## 2019-11-02 LAB — COMPREHENSIVE METABOLIC PANEL
ALT: 16 U/L (ref 0–44)
AST: 16 U/L (ref 15–41)
Albumin: 4.2 g/dL (ref 3.5–5.0)
Alkaline Phosphatase: 64 U/L (ref 38–126)
Anion gap: 9 (ref 5–15)
BUN: 13 mg/dL (ref 6–20)
CO2: 21 mmol/L — ABNORMAL LOW (ref 22–32)
Calcium: 8.7 mg/dL — ABNORMAL LOW (ref 8.9–10.3)
Chloride: 105 mmol/L (ref 98–111)
Creatinine, Ser: 0.45 mg/dL (ref 0.44–1.00)
GFR calc Af Amer: >60 mL/min (ref >60)
GFR calc non Af Amer: >60 mL/min (ref >60)
Glucose, Bld: 120 mg/dL — ABNORMAL HIGH (ref 70–99)
Potassium: 3.6 mmol/L (ref 3.5–5.1)
Sodium: 135 mmol/L (ref 135–145)
Total Bilirubin: 0.7 mg/dL (ref 0.3–1.2)
Total Protein: 8.2 g/dL — ABNORMAL HIGH (ref 6.5–8.1)

## 2019-11-02 LAB — LIPID PANEL
Cholesterol: 100 mg/dL (ref 0–200)
HDL: 33 mg/dL — ABNORMAL LOW (ref >40)
LDL Cholesterol: 59 mg/dL (ref 0–99)
Total CHOL/HDL Ratio: 3 RATIO
Triglycerides: 41 mg/dL (ref <150)
VLDL: 8 mg/dL (ref 0–40)

## 2019-11-02 LAB — TSH: TSH: 1.035 u[IU]/mL (ref 0.350–4.500)

## 2019-11-02 LAB — VITAMIN D 25 HYDROXY (VIT D DEFICIENCY, FRACTURES): Vit D, 25-Hydroxy: 30.84 ng/mL (ref 30–100)

## 2019-11-02 LAB — HEMOGLOBIN A1C
Hgb A1c MFr Bld: 7.6 % — ABNORMAL HIGH (ref 4.8–5.6)
Mean Plasma Glucose: 171.42 mg/dL

## 2019-12-09 ENCOUNTER — Ambulatory Visit (INDEPENDENT_AMBULATORY_CARE_PROVIDER_SITE_OTHER): Payer: Medicaid Other | Admitting: Obstetrics and Gynecology

## 2019-12-09 ENCOUNTER — Other Ambulatory Visit: Payer: Self-pay

## 2019-12-09 ENCOUNTER — Other Ambulatory Visit (HOSPITAL_COMMUNITY)
Admission: RE | Admit: 2019-12-09 | Discharge: 2019-12-09 | Disposition: A | Discharge status: Home / Self Care | Payer: Medicaid Other | Source: Ambulatory Visit | Attending: Obstetrics and Gynecology | Admitting: Obstetrics and Gynecology

## 2019-12-09 ENCOUNTER — Encounter: Payer: Self-pay | Admitting: Obstetrics and Gynecology

## 2019-12-09 VITALS — BP 144/98 | HR 106 | Ht <= 58 in | Wt 188.8 lb

## 2019-12-09 DIAGNOSIS — Z86718 Personal history of other venous thrombosis and embolism: Secondary | ICD-10-CM | POA: Diagnosis not present

## 2019-12-09 DIAGNOSIS — N923 Ovulation bleeding: Secondary | ICD-10-CM

## 2019-12-09 DIAGNOSIS — N946 Dysmenorrhea, unspecified: Secondary | ICD-10-CM

## 2019-12-09 DIAGNOSIS — Z8742 Personal history of other diseases of the female genital tract: Secondary | ICD-10-CM

## 2019-12-09 DIAGNOSIS — Z124 Encounter for screening for malignant neoplasm of cervix: Secondary | ICD-10-CM | POA: Insufficient documentation

## 2019-12-09 DIAGNOSIS — N898 Other specified noninflammatory disorders of vagina: Secondary | ICD-10-CM

## 2019-12-09 DIAGNOSIS — Z01419 Encounter for gynecological examination (general) (routine) without abnormal findings: Secondary | ICD-10-CM | POA: Diagnosis not present

## 2019-12-09 DIAGNOSIS — I10 Essential (primary) hypertension: Secondary | ICD-10-CM | POA: Diagnosis not present

## 2019-12-09 DIAGNOSIS — E119 Type 2 diabetes mellitus without complications: Secondary | ICD-10-CM | POA: Diagnosis not present

## 2019-12-09 DIAGNOSIS — Z86018 Personal history of other benign neoplasm: Secondary | ICD-10-CM

## 2019-12-09 DIAGNOSIS — Q743 Arthrogryposis multiplex congenita: Secondary | ICD-10-CM

## 2019-12-09 MED ORDER — IBUPROFEN 600 MG PO TABS: 600.0000 mg | ORAL_TABLET | Freq: Four times a day (QID) | ORAL | 3 refills | Status: DC | PRN

## 2019-12-09 NOTE — Patient Instructions (Addendum)
Preventive Care 21-36 Years Old, Female Preventive care refers to visits with your health care provider and lifestyle choices that can promote health and wellness. This includes:  A yearly physical exam. This may also be called an annual well check.  Regular dental visits and eye exams.  Immunizations.  Screening for certain conditions.  Healthy lifestyle choices, such as eating a healthy diet, getting regular exercise, not using drugs or products that contain nicotine and tobacco, and limiting alcohol use. What can I expect for my preventive care visit? Physical exam Your health care provider will check your:  Height and weight. This may be used to calculate body mass index (BMI), which tells if you are at a healthy weight.  Heart rate and blood pressure.  Skin for abnormal spots. Counseling Your health care provider may ask you questions about your:  Alcohol, tobacco, and drug use.  Emotional well-being.  Home and relationship well-being.  Sexual activity.  Eating habits.  Work and work environment.  Method of birth control.  Menstrual cycle.  Pregnancy history. What immunizations do I need?  Influenza (flu) vaccine  This is recommended every year. Tetanus, diphtheria, and pertussis (Tdap) vaccine  You may need a Td booster every 10 years. Varicella (chickenpox) vaccine  You may need this if you have not been vaccinated. Human papillomavirus (HPV) vaccine  If recommended by your health care provider, you may need three doses over 6 months. Measles, mumps, and rubella (MMR) vaccine  You may need at least one dose of MMR. You may also need a second dose. Meningococcal conjugate (MenACWY) vaccine  One dose is recommended if you are age 19-21 years and a first-year college student living in a residence hall, or if you have one of several medical conditions. You may also need additional booster doses. Pneumococcal conjugate (PCV13) vaccine  You may need  this if you have certain conditions and were not previously vaccinated. Pneumococcal polysaccharide (PPSV23) vaccine  You may need one or two doses if you smoke cigarettes or if you have certain conditions. Hepatitis A vaccine  You may need this if you have certain conditions or if you travel or work in places where you may be exposed to hepatitis A. Hepatitis B vaccine  You may need this if you have certain conditions or if you travel or work in places where you may be exposed to hepatitis B. Haemophilus influenzae type b (Hib) vaccine  You may need this if you have certain conditions. You may receive vaccines as individual doses or as more than one vaccine together in one shot (combination vaccines). Talk with your health care provider about the risks and benefits of combination vaccines. What tests do I need?  Blood tests  Lipid and cholesterol levels. These may be checked every 5 years starting at age 20.  Hepatitis C test.  Hepatitis B test. Screening  Diabetes screening. This is done by checking your blood sugar (glucose) after you have not eaten for a while (fasting).  Sexually transmitted disease (STD) testing.  BRCA-related cancer screening. This may be done if you have a family history of breast, ovarian, tubal, or peritoneal cancers.  Pelvic exam and Pap test. This may be done every 3 years starting at age 21. Starting at age 30, this may be done every 5 years if you have a Pap test in combination with an HPV test. Talk with your health care provider about your test results, treatment options, and if necessary, the need for more tests.   Follow these instructions at home: Eating and drinking   Eat a diet that includes fresh fruits and vegetables, whole grains, lean protein, and low-fat dairy.  Take vitamin and mineral supplements as recommended by your health care provider.  Do not drink alcohol if: ? Your health care provider tells you not to drink. ? You are  pregnant, may be pregnant, or are planning to become pregnant.  If you drink alcohol: ? Limit how much you have to 0-1 drink a day. ? Be aware of how much alcohol is in your drink. In the [place] one drink equals one 12 oz bottle of beer (355 mL), one 5 oz glass of wine (148 mL), or one 1 oz glass of hard liquor (44 mL). Lifestyle  Take daily care of your teeth and gums.  Stay active. Exercise for at least 30 minutes on 5 or more days each week.  Do not use any products that contain nicotine or tobacco, such as cigarettes, e-cigarettes, and chewing tobacco. If you need help quitting, ask your health care provider.  If you are sexually active, practice safe sex. Use a condom or other form of birth control (contraception) in order to prevent pregnancy and STIs (sexually transmitted infections). If you plan to become pregnant, see your health care provider for a preconception visit. What's next?  Visit your health care provider once a year for a well check visit.  Ask your health care provider how often you should have your eyes and teeth checked.  Stay up to date on all vaccines. This information is not intended to replace advice given to you by your health care provider. Make sure you discuss any questions you have with your health care provider. Document Revised: 01/23/2018 Document Reviewed: 01/23/2018 Elsevier Patient Education  2020 Elsevier Inc. Breast Self-Awareness Breast self-awareness is knowing how your breasts look and feel. Doing breast self-awareness is important. It allows you to catch a breast problem early while it is still small and can be treated. All women should do breast self-awareness, including women who have had breast implants. Tell your doctor if you notice a change in your breasts. What you need:  A mirror.  A well-lit room. How to do a breast self-exam A breast self-exam is one way to learn what is normal for your breasts and to check for changes. To do a  breast self-exam: Look for changes  1. Take off all the clothes above your waist. 2. Stand in front of a mirror in a room with good lighting. 3. Put your hands on your hips. 4. Push your hands down. 5. Look at your breasts and nipples in the mirror to see if one breast or nipple looks different from the other. Check to see if: ? The shape of one breast is different. ? The size of one breast is different. ? There are wrinkles, dips, and bumps in one breast and not the other. 6. Look at each breast for changes in the skin, such as: ? Redness. ? Scaly areas. 7. Look for changes in your nipples, such as: ? Liquid around the nipples. ? Bleeding. ? Dimpling. ? Redness. ? A change in where the nipples are. Feel for changes  1. Lie on your back on the floor. 2. Feel each breast. To do this, follow these steps: ? Pick a breast to feel. ? Put the arm closest to that breast above your head. ? Use your other arm to feel the nipple area of your breast. Feel   breast. Feel the area with the pads of your three middle fingers by making small circles with your fingers. For the first circle, press lightly. For the second circle, press harder. For the third circle, press even harder. ? Keep making circles with your fingers at the different pressures as you move down your breast. Stop when you feel your ribs. ? Move your fingers a little toward the center of your body. ? Start making circles with your fingers again, this time going up until you reach your collarbone. ? Keep making up-and-down circles until you reach your armpit. Remember to keep using the three pressures. ? Feel the other breast in the same way. 3. Sit or stand in the tub or shower. 4. With soapy water on your skin, feel each breast the same way you did in step 2 when you were lying on the floor. Write down what you find Writing down what you find can help you remember what to tell your doctor. Write down:  What is normal for each breast.  Any  changes you find in each breast, including: ? The kind of changes you find. ? Whether you have pain. ? Size and location of any lumps.  When you last had your menstrual period. General tips  Check your breasts every month.  If you are breastfeeding, the best time to check your breasts is after you feed your baby or after you use a breast pump.  If you get menstrual periods, the best time to check your breasts is 5-7 days after your menstrual period is over.  With time, you will become comfortable with the self-exam, and you will begin to know if there are changes in your breasts. Contact a doctor if you:  See a change in the shape or size of your breasts or nipples.  See a change in the skin of your breast or nipples, such as red or scaly skin.  Have fluid coming from your nipples that is not normal.  Find a lump or thick area that was not there before.  Have pain in your breasts.  Have any concerns about your breast health. Summary  Breast self-awareness includes looking for changes in your breasts, as well as feeling for changes within your breasts.  Breast self-awareness should be done in front of a mirror in a well-lit room.  You should check your breasts every month. If you get menstrual periods, the best time to check your breasts is 5-7 days after your menstrual period is over.  Let your doctor know of any changes you see in your breasts, including changes in size, changes on the skin, pain or tenderness, or fluid from your nipples that is not normal. This information is not intended to replace advice given to you by your health care provider. Make sure you discuss any questions you have with your health care provider. Document Revised: 12/31/2017 Document Reviewed: 12/31/2017 Elsevier Patient Education  [employer].

## 2019-12-09 NOTE — Progress Notes (Signed)
Pt present for annual exam. Pt stated that she was doing well other than having vaginal itching.

## 2019-12-09 NOTE — Progress Notes (Addendum)
GYNECOLOGY ANNUAL PHYSICAL EXAM PROGRESS NOTE  Subjective:    Robin Vazquez is a 34 y.o. G29P0010 female who presents for an annual exam.  The patient is not currently sexually active. The patient wears seatbelts: yes. The patient participates in regular exercise: no. Has the patient ever been transfused or tattooed?: no. The patient reports that there is not domestic violence in her life.    The patient has complaints today:  1)  Patient thinks that she may be getting another yeast infection. Notes that she has had some mild occasional itching in the vagina. . Symptoms have been ongoing for the past several days.    Gynecologic History Menarche age: 55 Patient's last menstrual period was 12/03/2019.  Notes bleeding (spotting) in between menses, ~ 1 year.  Contraception: abstinence History of STI's: None Last Pap:02/2018: NILM, but positive HR HPV.  H/o abnormal pap in 10/2016, LGSIL.     Menstrual History:  Menarche age: 3 Patient's last menstrual period was 12/03/2019. Period Cycle (Days): 28 Period Duration (Days): 6-7 Period Pattern: Regular Menstrual Flow: Moderate Menstrual Control: Maxi pad Menstrual Control Change Freq (Hours): 3-4 Dysmenorrhea: (!) Severe (1st 1-2 days). Relieved with Tylenol Dysmenorrhea Symptoms: Cramping   OB History  Gravida Para Term Preterm AB Living  1       1    SAB TAB Ectopic Multiple Live Births  1            # Outcome Date GA Lbr Len/2nd Weight Sex Delivery Anes PTL Lv  1 SAB 10/2016 [redacted]w[redacted]d           Past Medical History:  Diagnosis Date  . Acute superficial venous thrombosis of left lower extremity 02/2016  . Anemia   . Congenital multiple arthrogryposis   . Diabetes mellitus without complication ([place])   . Herpes    genitial  . History of uterine fibroid 09/2015  . Hyperlipemia   . Hypertension   . Obesity     Past Surgical History:  Procedure Laterality Date  . joint     on legs and feet  . MYOMECTOMY ABDOMINAL  APPROACH  10/18/2017   performed at [facility]  . UTERINE FIBROID SURGERY      Family History  Adopted: Yes  Family history unknown: Yes    Social History   Socioeconomic History  . Marital status: Single    Spouse name: Not on file  . Number of children: Not on file  . Years of education: Not on file  . Highest education level: Not on file  Occupational History  . Not on file  Tobacco Use  . Smoking status: Never Smoker  . Smokeless tobacco: Never Used  Vaping Use  . Vaping Use: Never used  Substance and Sexual Activity  . Alcohol use: No  . Drug use: No  . Sexual activity: Not Currently    Birth control/protection: None  Other Topics Concern  . Not on file  Social History Narrative  . Not on file   Social Determinants of Health   Financial Resource Strain:   . Difficulty of Paying Living Expenses:   Food Insecurity:   . Worried About Charity fundraiser in the Last Year:   . Arboriculturist in the Last Year:   Transportation Needs:   . Film/video editor (Medical):   Marland Kitchen Lack of Transportation (Non-Medical):   Physical Activity:   . Days of Exercise per Week:   . Minutes of Exercise per Session:  Stress:   . Feeling of Stress :   Social Connections:   . Frequency of Communication with Friends and Family:   . Frequency of Social Gatherings with Friends and Family:   . Attends Religious Services:   . Active Member of Clubs or Organizations:   . Attends Archivist Meetings:   Marland Kitchen Marital Status:   Intimate Partner Violence:   . Fear of Current or Ex-Partner:   . Emotionally Abused:   Marland Kitchen Physically Abused:   . Sexually Abused:     Current Outpatient Medications on File Prior to Visit  Medication Sig Dispense Refill  . enalapril (VASOTEC) 5 MG tablet Take 5 mg by mouth daily.    Marland Kitchen glipiZIDE (GLUCOTROL) 10 MG tablet Take 10 mg by mouth daily before breakfast.    . fluconazole (DIFLUCAN) 150 MG tablet Take 1 tablet (150 mg total) by mouth daily.  (Patient not taking: Reported on 12/09/2019) 1 tablet 0   No current facility-administered medications on file prior to visit.    No Known Allergies   Review of Systems Constitutional: negative for chills, fatigue, fevers and sweats Eyes: negative for irritation, redness and visual disturbance Ears, nose, mouth, throat, and face: negative for hearing loss, nasal congestion, snoring and tinnitus Respiratory: negative for asthma, cough, sputum Cardiovascular: negative for chest pain, dyspnea, exertional chest pressure/discomfort, irregular heart beat, palpitations and syncope Gastrointestinal: negative for abdominal pain, change in bowel habits, nausea and vomiting Genitourinary: negative for heavy menstrual periods, genital lesions, sexual problems, dysuria and urinary incontinence. Positive for mild vaginal irritation and discharge for several days Integument/breast: negative for breast lump, breast tenderness and nipple discharge Hematologic/lymphatic: negative for bleeding and easy bruising Musculoskeletal: negative for back pain and muscle weakness Neurologic: negative for dizziness, headaches, vertigo and weakness Endocrine: negative for diabetic symptoms including polydipsia, polyuria and skin dryness Allergic/Immunologic: negative for hay fever and urticaria      Objective:  Blood pressure (!) 144/98, pulse (!) 106, height 4\' 7"  (1.397 m), weight 188 lb 12.8 oz (85.6 kg), last menstrual period 12/03/2019. Body mass index is 43.88 kg/m.  General Appearance:    Alert, cooperative, no distress, appears stated age, morbid obesity.   Head:    Normocephalic, without obvious abnormality, atraumatic  Eyes:    PERRL, conjunctiva/corneas clear, EOM's intact, both eyes  Ears:    Normal external ear canals, both ears  Nose:   Nares normal, septum midline, mucosa normal, no drainage or sinus tenderness  Throat:   Lips, mucosa, and tongue normal; teeth and gums normal  Neck:   Supple,  symmetrical, trachea midline, no adenopathy; thyroid: no enlargement/tenderness/nodules; no carotid bruit or JVD  Back:     Symmetric, no curvature, ROM normal, no CVA tenderness  Lungs:     Clear to auscultation bilaterally, respirations unlabored  Chest Wall:    No tenderness or deformity   Heart:    Regular rate and rhythm, S1 and S2 normal, no murmur, rub or gallop  Breast Exam:    No tenderness, masses, or nipple abnormality  Abdomen:     Soft, non-tender, bowel sounds active all four quadrants, no masses, no organomegaly.    Genitalia:  External genitalia Vulva:- Normal. Bartholin's Gland- Bilateral- Normal. Perineum- Normal. Clitoris- Normal. Introitus:Characteristics- Normal(However very narrow introitus with prominent pubic arch.). Discharge- None. Urethra:Characteristics- Normal. Speculum & Bimanual Vagina:No lesions. Vaginal Mucosa- No Atrophy, Relaxation.  No discharge visualized.  Vaginal mucosa difficult to assess as patient very tense during exam and use  of pediatric speculum suboptimal against body habitus. Cervix:Characteristics- Normal(Cervix difficult to assess as patient very tense during exam and use of pediatric speculum suboptimal against body habitus.) Uterus:Characteristics- Uterus ~ 12-14 week sized. Position- Midposition. Mobile. Normal Contour Adnexa:Characteristics- Bilateral- Normal. However difficult to assess due to body habitus.   Rectal:    Normal external sphincter.  No hemorrhoids appreciated. Internal exam not done.   Extremities:   Joint contraction of knees and elbows bilaterally, shortened upper and lower limbs. Atraumatic, no cyanosis or edema  Pulses:   2+ and symmetric all extremities  Skin:   Skin color, texture, turgor normal, no rashes.   Lymph nodes:   Cervical, supraclavicular, and axillary nodes normal  Neurologic:   CNII-XII intact, normal strength, sensation and reflexes throughout    Labs:  Lab Results  Component  Value Date   WBC 7.8 11/02/2019   HGB 12.5 11/02/2019   HCT 41.1 11/02/2019   MCV 77.1 (L) 11/02/2019   PLT 349 11/02/2019    Lab Results  Component Value Date   CREATININE 0.45 11/02/2019   BUN 13 11/02/2019   NA 135 11/02/2019   K 3.6 11/02/2019   CL 105 11/02/2019   CO2 21 (L) 11/02/2019    Lab Results  Component Value Date   ALT 16 11/02/2019   AST 16 11/02/2019   ALKPHOS 64 11/02/2019   BILITOT 0.7 11/02/2019    Lab Results  Component Value Date   TSH 1.035 11/02/2019     Lab Results  Component Value Date   CHOL 100 11/02/2019   HDL 33 (L) 11/02/2019   LDLCALC 59 11/02/2019   TRIG 41 11/02/2019   CHOLHDL 3.0 11/02/2019    Lab Results  Component Value Date   HGBA1C 7.6 (H) 11/02/2019     Assessment:   1. Encounter for well woman exam with routine gynecological exam   2. Essential hypertension   3. Type 2 diabetes mellitus without complication, without long-term current use of insulin ([place])   4. History of blood clots   5. Congenital multiple arthrogryposis   6. Intermenstrual spotting   7. History of uterine fibroid   8. Cervical cancer screening   9. Vaginal irritation   10. Dysmenorrhea   11. History of abnormal cervical Pap smear     Plan:    Labs: Reviewed, recently performed by PCP.  Breast self exam technique reviewed and patient encouraged to perform self-exam monthly. Discussed healthy lifestyle modifications.  Pap smear: HPV positive pap smear in 2019. Previous LGSIL pap smear. Repeated again today.  HTN better controlled. Notes complaince with medications. Managed by PCP.  DM currently controlled on medications (per patient).  Managed by PCP.  Contraception: Maintaining abstinence.  Vaginal irritation, unable to visualize discharge. If pap smear returns with any signs of infection, will treat.  COVID vaccine declined.  Intermenstrual spotting x 1 year. Could be secondary to hormonal fluctuation mid-cycle, or possibly new fibroids  developing leading to irregular spotting/bleeding. WIll order pelvic ultrasound.  If normal, patient can opt for expectant management, or can consider management with a form of progesterone (however had h/o blood clot with use of Micronor). Cannot use estrogen-containing products due to HTN and age. Will notify patient of results by phone.  RTC in 1 year for annual exam.     Upstream - 12/09/19 1439      Pregnancy Intention Screening   Does the patient want to become pregnant in the next year? Unsure    Does the  patient's partner want to become pregnant in the next year? N/A    Would the patient like to discuss contraceptive options today? No      Contraception Wrap Up   Current Method Abstinence    End Method Abstinence    Contraception Counseling Provided Yes          The pregnancy intention screening data noted above was reviewed. Potential methods of contraception were discussed. The patient elected to proceed with Abstinence.     Rubie Maid, MD [facility]

## 2019-12-15 LAB — CYTOLOGY - PAP
Comment: NEGATIVE
Diagnosis: NEGATIVE
High risk HPV: NEGATIVE

## 2019-12-22 ENCOUNTER — Ambulatory Visit (INDEPENDENT_AMBULATORY_CARE_PROVIDER_SITE_OTHER): Payer: Medicaid Other

## 2019-12-22 DIAGNOSIS — N923 Ovulation bleeding: Secondary | ICD-10-CM

## 2019-12-22 DIAGNOSIS — Z86018 Personal history of other benign neoplasm: Secondary | ICD-10-CM | POA: Diagnosis not present

## 2020-04-04 ENCOUNTER — Other Ambulatory Visit
Admission: RE | Admit: 2020-04-04 | Discharge: 2020-04-04 | Disposition: A | Discharge status: Home / Self Care | Payer: Medicaid Other | Attending: Nurse Practitioner | Admitting: Nurse Practitioner

## 2020-04-04 ENCOUNTER — Other Ambulatory Visit: Payer: Self-pay

## 2020-04-04 DIAGNOSIS — E559 Vitamin D deficiency, unspecified: Secondary | ICD-10-CM | POA: Insufficient documentation

## 2020-04-04 DIAGNOSIS — I10 Essential (primary) hypertension: Secondary | ICD-10-CM | POA: Insufficient documentation

## 2020-04-04 DIAGNOSIS — E119 Type 2 diabetes mellitus without complications: Secondary | ICD-10-CM | POA: Diagnosis not present

## 2020-04-04 DIAGNOSIS — E785 Hyperlipidemia, unspecified: Secondary | ICD-10-CM | POA: Diagnosis not present

## 2020-04-04 LAB — CBC WITH DIFFERENTIAL/PLATELET
Abs Immature Granulocytes: 0.02 10*3/uL (ref 0.00–0.07)
Basophils Absolute: 0.1 10*3/uL (ref 0.0–0.1)
Basophils Relative: 1 %
Eosinophils Absolute: 0.1 10*3/uL (ref 0.0–0.5)
Eosinophils Relative: 1 %
HCT: 40.2 % (ref 36.0–46.0)
Hemoglobin: 12.7 g/dL (ref 12.0–15.0)
Immature Granulocytes: 0 %
Lymphocytes Relative: 43 %
Lymphs Abs: 3.5 10*3/uL (ref 0.7–4.0)
MCH: 25.7 pg — ABNORMAL LOW (ref 26.0–34.0)
MCHC: 31.6 g/dL (ref 30.0–36.0)
MCV: 81.4 fL (ref 80.0–100.0)
Monocytes Absolute: 0.5 10*3/uL (ref 0.1–1.0)
Monocytes Relative: 6 %
Neutro Abs: 4 10*3/uL (ref 1.7–7.7)
Neutrophils Relative %: 49 %
Platelets: 367 10*3/uL (ref 150–400)
RBC: 4.94 MIL/uL (ref 3.87–5.11)
RDW: 14.6 % (ref 11.5–15.5)
WBC: 8.1 10*3/uL (ref 4.0–10.5)
nRBC: 0 % (ref 0.0–0.2)

## 2020-04-04 LAB — COMPREHENSIVE METABOLIC PANEL
ALT: 15 U/L (ref 0–44)
AST: 14 U/L — ABNORMAL LOW (ref 15–41)
Albumin: 4.3 g/dL (ref 3.5–5.0)
Alkaline Phosphatase: 60 U/L (ref 38–126)
Anion gap: 9 (ref 5–15)
BUN: 14 mg/dL (ref 6–20)
CO2: 23 mmol/L (ref 22–32)
Calcium: 9.4 mg/dL (ref 8.9–10.3)
Chloride: 105 mmol/L (ref 98–111)
Creatinine, Ser: 0.39 mg/dL — ABNORMAL LOW (ref 0.44–1.00)
GFR, Estimated: >60 mL/min (ref >60)
Glucose, Bld: 142 mg/dL — ABNORMAL HIGH (ref 70–99)
Potassium: 3.5 mmol/L (ref 3.5–5.1)
Sodium: 137 mmol/L (ref 135–145)
Total Bilirubin: 0.5 mg/dL (ref 0.3–1.2)
Total Protein: 8.2 g/dL — ABNORMAL HIGH (ref 6.5–8.1)

## 2020-04-04 LAB — LIPID PANEL
Cholesterol: 121 mg/dL (ref 0–200)
HDL: 42 mg/dL (ref >40)
LDL Cholesterol: 71 mg/dL (ref 0–99)
Total CHOL/HDL Ratio: 2.9 RATIO
Triglycerides: 38 mg/dL (ref <150)
VLDL: 8 mg/dL (ref 0–40)

## 2020-04-04 LAB — HEMOGLOBIN A1C
Hgb A1c MFr Bld: 7.3 % — ABNORMAL HIGH (ref 4.8–5.6)
Mean Plasma Glucose: 162.81 mg/dL

## 2020-04-04 LAB — T4, FREE: Free T4: 0.82 ng/dL (ref 0.61–1.12)

## 2020-04-04 LAB — VITAMIN D 25 HYDROXY (VIT D DEFICIENCY, FRACTURES): Vit D, 25-Hydroxy: 38.3 ng/mL (ref 30–100)

## 2020-04-04 LAB — TSH: TSH: 1.201 u[IU]/mL (ref 0.350–4.500)

## 2020-04-05 LAB — T3 UPTAKE: T3 Uptake Ratio: 23 % — ABNORMAL LOW (ref 24–39)

## 2020-08-04 ENCOUNTER — Other Ambulatory Visit
Admission: RE | Admit: 2020-08-04 | Discharge: 2020-08-04 | Disposition: A | Discharge status: Home / Self Care | Payer: Medicaid Other | Attending: Nurse Practitioner | Admitting: Nurse Practitioner

## 2020-08-04 DIAGNOSIS — E119 Type 2 diabetes mellitus without complications: Secondary | ICD-10-CM | POA: Diagnosis not present

## 2020-08-04 DIAGNOSIS — R5383 Other fatigue: Secondary | ICD-10-CM | POA: Diagnosis not present

## 2020-08-04 DIAGNOSIS — E785 Hyperlipidemia, unspecified: Secondary | ICD-10-CM | POA: Diagnosis not present

## 2020-08-04 LAB — TSH: TSH: 1.335 u[IU]/mL (ref 0.350–4.500)

## 2020-08-04 LAB — LIPID PANEL
Cholesterol: 104 mg/dL (ref 0–200)
HDL: 38 mg/dL — ABNORMAL LOW (ref >40)
LDL Cholesterol: 58 mg/dL (ref 0–99)
Total CHOL/HDL Ratio: 2.7 RATIO
Triglycerides: 41 mg/dL (ref <150)
VLDL: 8 mg/dL (ref 0–40)

## 2020-08-04 LAB — COMPREHENSIVE METABOLIC PANEL
ALT: 20 U/L (ref 0–44)
AST: 21 U/L (ref 15–41)
Albumin: 4.3 g/dL (ref 3.5–5.0)
Alkaline Phosphatase: 61 U/L (ref 38–126)
Anion gap: 10 (ref 5–15)
BUN: 7 mg/dL (ref 6–20)
CO2: 21 mmol/L — ABNORMAL LOW (ref 22–32)
Calcium: 8.9 mg/dL (ref 8.9–10.3)
Chloride: 106 mmol/L (ref 98–111)
Creatinine, Ser: 0.35 mg/dL — ABNORMAL LOW (ref 0.44–1.00)
GFR, Estimated: >60 mL/min (ref >60)
Glucose, Bld: 114 mg/dL — ABNORMAL HIGH (ref 70–99)
Potassium: 3.5 mmol/L (ref 3.5–5.1)
Sodium: 137 mmol/L (ref 135–145)
Total Bilirubin: 0.6 mg/dL (ref 0.3–1.2)
Total Protein: 7.9 g/dL (ref 6.5–8.1)

## 2020-08-04 LAB — CBC
HCT: 40 % (ref 36.0–46.0)
Hemoglobin: 12 g/dL (ref 12.0–15.0)
MCH: 23.7 pg — ABNORMAL LOW (ref 26.0–34.0)
MCHC: 30 g/dL (ref 30.0–36.0)
MCV: 79.1 fL — ABNORMAL LOW (ref 80.0–100.0)
Platelets: 448 10*3/uL — ABNORMAL HIGH (ref 150–400)
RBC: 5.06 MIL/uL (ref 3.87–5.11)
RDW: 15.4 % (ref 11.5–15.5)
WBC: 7.5 10*3/uL (ref 4.0–10.5)
nRBC: 0 % (ref 0.0–0.2)

## 2020-08-04 LAB — HEMOGLOBIN A1C
Hgb A1c MFr Bld: 8.1 % — ABNORMAL HIGH (ref 4.8–5.6)
Mean Plasma Glucose: 185.77 mg/dL

## 2020-11-17 ENCOUNTER — Other Ambulatory Visit
Admission: RE | Admit: 2020-11-17 | Discharge: 2020-11-17 | Disposition: A | Discharge status: Home / Self Care | Payer: Medicaid Other | Source: Ambulatory Visit | Attending: Nurse Practitioner | Admitting: Nurse Practitioner

## 2020-11-17 DIAGNOSIS — E785 Hyperlipidemia, unspecified: Secondary | ICD-10-CM | POA: Diagnosis not present

## 2020-11-17 DIAGNOSIS — I10 Essential (primary) hypertension: Secondary | ICD-10-CM | POA: Insufficient documentation

## 2020-11-17 DIAGNOSIS — R5383 Other fatigue: Secondary | ICD-10-CM | POA: Insufficient documentation

## 2020-11-17 DIAGNOSIS — E119 Type 2 diabetes mellitus without complications: Secondary | ICD-10-CM | POA: Insufficient documentation

## 2020-11-17 LAB — CBC WITH DIFFERENTIAL/PLATELET
Abs Immature Granulocytes: 0.03 10*3/uL (ref 0.00–0.07)
Basophils Absolute: 0.1 10*3/uL (ref 0.0–0.1)
Basophils Relative: 1 %
Eosinophils Absolute: 0.1 10*3/uL (ref 0.0–0.5)
Eosinophils Relative: 2 %
HCT: 37.3 % (ref 36.0–46.0)
Hemoglobin: 11 g/dL — ABNORMAL LOW (ref 12.0–15.0)
Immature Granulocytes: 0 %
Lymphocytes Relative: 26 %
Lymphs Abs: 2.5 10*3/uL (ref 0.7–4.0)
MCH: 21 pg — ABNORMAL LOW (ref 26.0–34.0)
MCHC: 29.5 g/dL — ABNORMAL LOW (ref 30.0–36.0)
MCV: 71.3 fL — ABNORMAL LOW (ref 80.0–100.0)
Monocytes Absolute: 0.4 10*3/uL (ref 0.1–1.0)
Monocytes Relative: 5 %
Neutro Abs: 6.4 10*3/uL (ref 1.7–7.7)
Neutrophils Relative %: 66 %
Platelets: 545 10*3/uL — ABNORMAL HIGH (ref 150–400)
RBC: 5.23 MIL/uL — ABNORMAL HIGH (ref 3.87–5.11)
RDW: 16.7 % — ABNORMAL HIGH (ref 11.5–15.5)
WBC: 9.5 10*3/uL (ref 4.0–10.5)
nRBC: 0 % (ref 0.0–0.2)

## 2020-11-17 LAB — COMPREHENSIVE METABOLIC PANEL
ALT: 17 U/L (ref 0–44)
AST: 17 U/L (ref 15–41)
Albumin: 4.2 g/dL (ref 3.5–5.0)
Alkaline Phosphatase: 66 U/L (ref 38–126)
Anion gap: 11 (ref 5–15)
BUN: 11 mg/dL (ref 6–20)
CO2: 20 mmol/L — ABNORMAL LOW (ref 22–32)
Calcium: 9.3 mg/dL (ref 8.9–10.3)
Chloride: 105 mmol/L (ref 98–111)
Creatinine, Ser: 0.36 mg/dL — ABNORMAL LOW (ref 0.44–1.00)
GFR, Estimated: >60 mL/min (ref >60)
Glucose, Bld: 114 mg/dL — ABNORMAL HIGH (ref 70–99)
Potassium: 3.6 mmol/L (ref 3.5–5.1)
Sodium: 136 mmol/L (ref 135–145)
Total Bilirubin: 0.8 mg/dL (ref 0.3–1.2)
Total Protein: 8.4 g/dL — ABNORMAL HIGH (ref 6.5–8.1)

## 2020-11-17 LAB — LIPID PANEL
Cholesterol: 185 mg/dL (ref 0–200)
HDL: 43 mg/dL (ref >40)
LDL Cholesterol: 135 mg/dL — ABNORMAL HIGH (ref 0–99)
Total CHOL/HDL Ratio: 4.3 RATIO
Triglycerides: 36 mg/dL (ref <150)
VLDL: 7 mg/dL (ref 0–40)

## 2020-11-17 LAB — TSH: TSH: 1.105 u[IU]/mL (ref 0.350–4.500)

## 2020-11-18 LAB — HEMOGLOBIN A1C
Hgb A1c MFr Bld: 8.8 % — ABNORMAL HIGH (ref 4.8–5.6)
Mean Plasma Glucose: 206 mg/dL

## 2020-12-13 ENCOUNTER — Encounter: Payer: Medicaid Other | Admitting: Obstetrics and Gynecology

## 2021-03-13 ENCOUNTER — Other Ambulatory Visit
Admission: RE | Admit: 2021-03-13 | Discharge: 2021-03-13 | Disposition: A | Discharge status: Home / Self Care | Payer: Medicaid Other | Source: Ambulatory Visit | Attending: Nurse Practitioner | Admitting: Nurse Practitioner

## 2021-03-13 DIAGNOSIS — E119 Type 2 diabetes mellitus without complications: Secondary | ICD-10-CM | POA: Insufficient documentation

## 2021-03-13 DIAGNOSIS — R5383 Other fatigue: Secondary | ICD-10-CM | POA: Diagnosis not present

## 2021-03-13 DIAGNOSIS — E785 Hyperlipidemia, unspecified: Secondary | ICD-10-CM | POA: Insufficient documentation

## 2021-03-13 DIAGNOSIS — I10 Essential (primary) hypertension: Secondary | ICD-10-CM | POA: Insufficient documentation

## 2021-03-13 LAB — COMPREHENSIVE METABOLIC PANEL
ALT: 18 U/L (ref 0–44)
AST: 19 U/L (ref 15–41)
Albumin: 4.2 g/dL (ref 3.5–5.0)
Alkaline Phosphatase: 62 U/L (ref 38–126)
Anion gap: 9 (ref 5–15)
BUN: 10 mg/dL (ref 6–20)
CO2: 24 mmol/L (ref 22–32)
Calcium: 9.1 mg/dL (ref 8.9–10.3)
Chloride: 103 mmol/L (ref 98–111)
Creatinine, Ser: 0.34 mg/dL — ABNORMAL LOW (ref 0.44–1.00)
GFR, Estimated: >60 mL/min (ref >60)
Glucose, Bld: 178 mg/dL — ABNORMAL HIGH (ref 70–99)
Potassium: 3.5 mmol/L (ref 3.5–5.1)
Sodium: 136 mmol/L (ref 135–145)
Total Bilirubin: 0.4 mg/dL (ref 0.3–1.2)
Total Protein: 7.8 g/dL (ref 6.5–8.1)

## 2021-03-13 LAB — CBC WITH DIFFERENTIAL/PLATELET
Abs Immature Granulocytes: 0.03 10*3/uL (ref 0.00–0.07)
Basophils Absolute: 0 10*3/uL (ref 0.0–0.1)
Basophils Relative: 1 %
Eosinophils Absolute: 0.2 10*3/uL (ref 0.0–0.5)
Eosinophils Relative: 3 %
HCT: 39.3 % (ref 36.0–46.0)
Hemoglobin: 12.1 g/dL (ref 12.0–15.0)
Immature Granulocytes: 0 %
Lymphocytes Relative: 39 %
Lymphs Abs: 2.8 10*3/uL (ref 0.7–4.0)
MCH: 22.7 pg — ABNORMAL LOW (ref 26.0–34.0)
MCHC: 30.8 g/dL (ref 30.0–36.0)
MCV: 73.9 fL — ABNORMAL LOW (ref 80.0–100.0)
Monocytes Absolute: 0.4 10*3/uL (ref 0.1–1.0)
Monocytes Relative: 6 %
Neutro Abs: 3.7 10*3/uL (ref 1.7–7.7)
Neutrophils Relative %: 51 %
Platelets: 433 10*3/uL — ABNORMAL HIGH (ref 150–400)
RBC: 5.32 MIL/uL — ABNORMAL HIGH (ref 3.87–5.11)
RDW: 22.2 % — ABNORMAL HIGH (ref 11.5–15.5)
Smear Review: NORMAL
WBC: 7.2 10*3/uL (ref 4.0–10.5)
nRBC: 0 % (ref 0.0–0.2)

## 2021-03-13 LAB — LIPID PANEL
Cholesterol: 172 mg/dL (ref 0–200)
HDL: 44 mg/dL (ref >40)
LDL Cholesterol: 118 mg/dL — ABNORMAL HIGH (ref 0–99)
Total CHOL/HDL Ratio: 3.9 RATIO
Triglycerides: 51 mg/dL (ref <150)
VLDL: 10 mg/dL (ref 0–40)

## 2021-03-13 LAB — TSH: TSH: 1.013 u[IU]/mL (ref 0.350–4.500)

## 2021-03-14 LAB — HEMOGLOBIN A1C
Hgb A1c MFr Bld: 8.1 % — ABNORMAL HIGH (ref 4.8–5.6)
Mean Plasma Glucose: 186 mg/dL

## 2021-04-05 ENCOUNTER — Encounter: Payer: Medicaid Other | Admitting: Obstetrics and Gynecology

## 2021-05-25 ENCOUNTER — Encounter: Payer: Medicaid Other | Admitting: Obstetrics and Gynecology

## 2021-05-31 ENCOUNTER — Encounter (INDEPENDENT_AMBULATORY_CARE_PROVIDER_SITE_OTHER): Payer: Self-pay | Admitting: Family Medicine

## 2021-05-31 ENCOUNTER — Other Ambulatory Visit: Payer: Self-pay

## 2021-05-31 ENCOUNTER — Ambulatory Visit (INDEPENDENT_AMBULATORY_CARE_PROVIDER_SITE_OTHER): Payer: Medicaid Other | Admitting: Family Medicine

## 2021-05-31 VITALS — BP 102/84 | HR 66 | Temp 98.0°F | Ht <= 58 in | Wt 186.0 lb

## 2021-05-31 DIAGNOSIS — E1165 Type 2 diabetes mellitus with hyperglycemia: Secondary | ICD-10-CM

## 2021-05-31 DIAGNOSIS — Z6841 Body Mass Index (BMI) 40.0 and over, adult: Secondary | ICD-10-CM

## 2021-05-31 DIAGNOSIS — E782 Mixed hyperlipidemia: Secondary | ICD-10-CM

## 2021-05-31 DIAGNOSIS — D649 Anemia, unspecified: Secondary | ICD-10-CM

## 2021-05-31 DIAGNOSIS — E559 Vitamin D deficiency, unspecified: Secondary | ICD-10-CM

## 2021-05-31 DIAGNOSIS — E1159 Type 2 diabetes mellitus with other circulatory complications: Secondary | ICD-10-CM

## 2021-05-31 DIAGNOSIS — E1169 Type 2 diabetes mellitus with other specified complication: Secondary | ICD-10-CM | POA: Diagnosis not present

## 2021-05-31 DIAGNOSIS — R0602 Shortness of breath: Secondary | ICD-10-CM

## 2021-05-31 DIAGNOSIS — E118 Type 2 diabetes mellitus with unspecified complications: Secondary | ICD-10-CM | POA: Insufficient documentation

## 2021-05-31 DIAGNOSIS — Q749 Unspecified congenital malformation of limb(s): Secondary | ICD-10-CM

## 2021-05-31 DIAGNOSIS — I152 Hypertension secondary to endocrine disorders: Secondary | ICD-10-CM

## 2021-05-31 DIAGNOSIS — R5383 Other fatigue: Secondary | ICD-10-CM | POA: Diagnosis not present

## 2021-05-31 DIAGNOSIS — Z1331 Encounter for screening for depression: Secondary | ICD-10-CM

## 2021-05-31 DIAGNOSIS — Q799 Congenital malformation of musculoskeletal system, unspecified: Secondary | ICD-10-CM

## 2021-05-31 DIAGNOSIS — D509 Iron deficiency anemia, unspecified: Secondary | ICD-10-CM | POA: Insufficient documentation

## 2021-06-01 ENCOUNTER — Encounter (INDEPENDENT_AMBULATORY_CARE_PROVIDER_SITE_OTHER): Payer: Self-pay | Admitting: Family Medicine

## 2021-06-01 NOTE — Progress Notes (Signed)
Dear Robin Bio, NP,   Thank you for referring Robin Vazquez to our clinic. The following note includes my evaluation and treatment recommendations.  Chief Complaint:   OBESITY Robin Vazquez (MR# [id number]) is a 36 y.o. female who presents for evaluation and treatment of obesity and related comorbidities. Current BMI is Body mass index is 43.23 kg/m. Robin Vazquez has been struggling with her weight for many years and has been unsuccessful in either losing weight, maintaining weight loss, or reaching her healthy weight goal.  Robin Vazquez is currently in the action stage of change and ready to dedicate time achieving and maintaining a healthier weight. Robin Vazquez is interested in becoming our patient and working on intensive lifestyle modifications including (but not limited to) diet and exercise for weight loss.  Robin Vazquez is single and lives alone on disability. She does not work.she eats out frequently and craves fast foods and sweets. Robin Vazquez snacks on ice cream, cookies, chips and skips breakfast. She sees Robin Vazquez in [place]. She has an aide that comes to her house daily to help with ADL/cleaning house, etc.  Robin Vazquez's habits were reviewed today and are as follows: her desired weight loss is 66 lbs, she has been heavy most of her life, she started gaining weight as a kid, her heaviest weight ever was 220 pounds, she has significant food cravings issues, she skips meals frequently, she is frequently drinking liquids with calories, she frequently makes poor food choices, she has problems with excessive hunger, she frequently eats larger portions than normal, she has binge eating behaviors, and she struggles with emotional eating.  Depression Screen Robin Vazquez's Food and Mood (modified PHQ-9) score was 3.  Depression screen PHQ 2/9 05/31/2021  Decreased Interest 1  Down, Depressed, Hopeless 0  PHQ - 2 Score 1  Altered sleeping 0  Tired, decreased energy 1  Change in appetite 0   Feeling bad or failure about yourself  0  Trouble concentrating 1  Moving slowly or fidgety/restless 0  Suicidal thoughts 0  PHQ-9 Score 3  Difficult doing work/chores Not difficult at all   Subjective:   1. Other fatigue Robin Vazquez admits to daytime somnolence and denies waking up still tired. Patent has a history of symptoms of daytime fatigue. Robin Vazquez generally gets 6 hours of sleep per night, and states that she has generally restful sleep. Snoring is present. Apneic episodes are not present. Epworth Sleepiness Score is 4.  2. Shortness of breath on exertion Robin Vazquez notes increasing shortness of breath with exercising and seems to be worsening over time with weight gain. She notes getting out of breath sooner with activity than she used to. This has gotten worse recently. Robin Vazquez denies shortness of breath at rest or orthopnea.  3. Poorly controlled type 2 diabetes mellitus with complication (HCC) FBS= 725, 140, 150, 124, 179, 172; 2 hours post prandial= 280, 189, 170, 120. Robin Vazquez denies complaints. She was diagnosed in 2012. Robin Vazquez is adopted. She has never been on other diabetes meds. Medication: Farxiga, glipizide, acarbose  4. Mixed diabetic hyperlipidemia associated with type 2 diabetes mellitus ([place]) Labs last checked 3 months ago and LDL was not at goal. Robin Vazquez eats a lot of fast foods and admits to poor dietary habits. Medication: Crestor  5. Hypertension associated with diabetes ([place]) At goal today. Medication: Norvasc, enalapril  6. Anemia, unspecified type Robin Vazquez has history of iron deficiency and fibroids that were removed in 2019, hence her menorrhalgia since. She takes ferrous sulfate 325 mg QD. Currently asymptomatic.  7. Vitamin D deficiency Robin Vazquez was told she was deficient years ago but does not take a Vit D supplement. Medication: multivitamin with Vit D in it  8. Congenital anomaly of bone and joint Robin Vazquez's parents were [place] and she was given up for adoption (thus she doesn't  know parental family history). Review of chart does not reveal a specific diagnosis. Robin Vazquez sees no orthopedist or other specialists bedside podiatry for diabetes foot exams. She has severe physical limitations. Robin Vazquez has no complaints and denies joint pains.  Assessment/Plan:   Orders Placed This Encounter  Procedures   Lipid panel   VITAMIN D 25 Hydroxy (Vit-D Deficiency, Fractures)   TSH   Hemoglobin A1c   Comprehensive metabolic panel   CBC with Differential/Platelet   Folate   Vitamin B12   Insulin, random   T4, free   EKG 12-Lead    Medications Discontinued During This Encounter  Medication Reason   ibuprofen (ADVIL) 600 MG tablet    fluconazole (DIFLUCAN) 150 MG tablet      No orders of the defined types were placed in this encounter.    1. Other fatigue Robin Vazquez does feel that her weight is causing her energy to be lower than it should be. Fatigue may be related to obesity, depression or many other causes. Labs will be ordered, and in the meanwhile, Robin Vazquez will focus on self care including making healthy food choices, increasing physical activity and focusing on stress reduction.  - EKG 12-Lead  2. Shortness of breath on exertion Robin Vazquez does feel that she gets out of breath more easily that she used to when she exercises. Robin Vazquez's shortness of breath appears to be obesity related and exercise induced. She has agreed to work on weight loss and gradually increase exercise to treat her exercise induced shortness of breath. Will continue to monitor closely.  3. Poorly controlled type 2 diabetes mellitus with complication (HCC) Poorly controlled. Check labs today. We will consider change in tx in the future if Robin Vazquez is still not at goal.  - TSH - Hemoglobin A1c - Insulin, random - T4, free  4. Mixed diabetic hyperlipidemia associated with type 2 diabetes mellitus ([place]) Check labs today. Continue meds per PCP for now. Focus on prudent nutritional plan and weight loss.  -  Lipid panel - Comprehensive metabolic panel - CBC with Differential/Platelet  5. Hypertension associated with diabetes ([place]) BP at goal today. Robin Vazquez is working on healthy weight loss and exercise to improve blood pressure control. We will watch for signs of hypotension as she continues her lifestyle modifications.  6. Anemia, unspecified type Check labs today.  - Folate - Vitamin B12  7. Vitamin D deficiency Low Vitamin D level contributes to fatigue and are associated with obesity, breast, and colon cancer. She agrees to continue to take OTC multivitamin daily and will follow-up for routine testing of Vitamin D, at least 2-3 times per year to avoid over-replacement. Check labs today.  - VITAMIN D 25 Hydroxy (Vit-D Deficiency, Fractures)  8. Congenital anomaly of bone and joint Obtain notes from Robin Vazquez's private practice PCP. Check labs today.  9. Screening for depression Robin Vazquez had a positive depression screening. Depression is commonly associated with obesity and often results in emotional eating behaviors. We will monitor this closely and work on CBT to help improve the non-hunger eating patterns. Referral to Psychology may be required if no improvement is seen as she continues in our clinic.  10. Obesity with current BMI of 43.23  Robin Vazquez  is currently in the action stage of change and her goal is to continue with weight loss efforts. I recommend Robin Vazquez begin the structured treatment plan as follows:  She has agreed to the Category 2 Plan.  After January 9th, Robin Vazquez will go to the [facility] to get lab draw.  Exercise goals:  As is    Behavioral modification strategies: decreasing liquid calories, decreasing eating out, no skipping meals, and planning for success.  She was informed of the importance of frequent follow-up visits to maximize her success with intensive lifestyle modifications for her multiple health conditions. She was informed we would discuss her  lab results at her next visit unless there is a critical issue that needs to be addressed sooner. Robin Vazquez agreed to keep her next visit at the agreed upon time to discuss these results.  Objective:   Blood pressure 102/84, pulse 66, temperature 98 F (36.7 C), height 4\' 7"  (1.397 m), weight 186 lb (84.4 kg), SpO2 100 %. Body mass index is 43.23 kg/m.  EKG: Normal sinus rhythm, rate 77.  Indirect Calorimeter completed today shows a VO2 of 256 and a REE of 1771.    General: Cooperative, alert, well developed, in no acute distress. HEENT: Conjunctivae and lids unremarkable. Cardiovascular: Regular rhythm.  Lungs: Normal work of breathing. Neurologic: No focal deficits.   Lab Results  Component Value Date   CREATININE 0.34 (L) 03/13/2021   BUN 10 03/13/2021   NA 136 03/13/2021   K 3.5 03/13/2021   CL 103 03/13/2021   CO2 24 03/13/2021   Lab Results  Component Value Date   ALT 18 03/13/2021   AST 19 03/13/2021   ALKPHOS 62 03/13/2021   BILITOT 0.4 03/13/2021   Lab Results  Component Value Date   HGBA1C 8.1 (H) 03/13/2021   HGBA1C 8.8 (H) 11/17/2020   HGBA1C 8.1 (H) 08/04/2020   HGBA1C 7.3 (H) 04/04/2020   HGBA1C 7.6 (H) 11/02/2019   No results found for: INSULIN Lab Results  Component Value Date   TSH 1.013 03/13/2021   Lab Results  Component Value Date   CHOL 172 03/13/2021   HDL 44 03/13/2021   LDLCALC 118 (H) 03/13/2021   TRIG 51 03/13/2021   CHOLHDL 3.9 03/13/2021   Lab Results  Component Value Date   WBC 7.2 03/13/2021   HGB 12.1 03/13/2021   HCT 39.3 03/13/2021   MCV 73.9 (L) 03/13/2021   PLT 433 (H) 03/13/2021    Attestation Statements:   Reviewed by clinician on day of visit: allergies, medications, problem list, medical history, surgical history, family history, social history, and previous encounter notes.  Time spent on visit including pre-visit chart review and post-visit charting and care was 65 minutes.   Coral Ceo, CMA, am  acting as transcriptionist for [employer], DO.  I have reviewed the above documentation for accuracy and completeness, and I agree with the above. Marjory Sneddon, D.O.  The [place] was signed into law in 2016 which includes the topic of electronic health records.  This provides immediate access to information in MyChart.  This includes consultation notes, operative notes, office notes, lab results and pathology reports.  If you have any questions about what you read please let us know at your next visit so we can discuss your concerns and take corrective action if need be.  We are right here with you.

## 2021-06-08 ENCOUNTER — Other Ambulatory Visit
Admission: RE | Admit: 2021-06-08 | Discharge: 2021-06-08 | Disposition: A | Discharge status: Home / Self Care | Payer: Medicaid Other | Attending: Family Medicine | Admitting: Family Medicine

## 2021-06-08 DIAGNOSIS — E782 Mixed hyperlipidemia: Secondary | ICD-10-CM | POA: Diagnosis not present

## 2021-06-08 DIAGNOSIS — D649 Anemia, unspecified: Secondary | ICD-10-CM | POA: Insufficient documentation

## 2021-06-08 DIAGNOSIS — E1165 Type 2 diabetes mellitus with hyperglycemia: Secondary | ICD-10-CM | POA: Insufficient documentation

## 2021-06-08 DIAGNOSIS — E559 Vitamin D deficiency, unspecified: Secondary | ICD-10-CM | POA: Diagnosis not present

## 2021-06-08 DIAGNOSIS — E1169 Type 2 diabetes mellitus with other specified complication: Secondary | ICD-10-CM | POA: Insufficient documentation

## 2021-06-08 LAB — LIPID PANEL
Cholesterol: 122 mg/dL (ref 0–200)
HDL: 37 mg/dL — ABNORMAL LOW (ref >40)
LDL Cholesterol: 76 mg/dL (ref 0–99)
Total CHOL/HDL Ratio: 3.3 RATIO
Triglycerides: 46 mg/dL (ref <150)
VLDL: 9 mg/dL (ref 0–40)

## 2021-06-08 LAB — COMPREHENSIVE METABOLIC PANEL
ALT: 22 U/L (ref 0–44)
AST: 18 U/L (ref 15–41)
Albumin: 4.3 g/dL (ref 3.5–5.0)
Alkaline Phosphatase: 55 U/L (ref 38–126)
Anion gap: 12 (ref 5–15)
BUN: 9 mg/dL (ref 6–20)
CO2: 19 mmol/L — ABNORMAL LOW (ref 22–32)
Calcium: 8.8 mg/dL — ABNORMAL LOW (ref 8.9–10.3)
Chloride: 103 mmol/L (ref 98–111)
Creatinine, Ser: 0.34 mg/dL — ABNORMAL LOW (ref 0.44–1.00)
GFR, Estimated: >60 mL/min (ref >60)
Glucose, Bld: 184 mg/dL — ABNORMAL HIGH (ref 70–99)
Potassium: 3.6 mmol/L (ref 3.5–5.1)
Sodium: 134 mmol/L — ABNORMAL LOW (ref 135–145)
Total Bilirubin: 0.9 mg/dL (ref 0.3–1.2)
Total Protein: 7.9 g/dL (ref 6.5–8.1)

## 2021-06-08 LAB — CBC WITH DIFFERENTIAL/PLATELET
Abs Immature Granulocytes: 0.02 10*3/uL (ref 0.00–0.07)
Basophils Absolute: 0 10*3/uL (ref 0.0–0.1)
Basophils Relative: 1 %
Eosinophils Absolute: 0.1 10*3/uL (ref 0.0–0.5)
Eosinophils Relative: 2 %
HCT: 40.4 % (ref 36.0–46.0)
Hemoglobin: 12.7 g/dL (ref 12.0–15.0)
Immature Granulocytes: 0 %
Lymphocytes Relative: 35 %
Lymphs Abs: 1.9 10*3/uL (ref 0.7–4.0)
MCH: 25 pg — ABNORMAL LOW (ref 26.0–34.0)
MCHC: 31.4 g/dL (ref 30.0–36.0)
MCV: 79.7 fL — ABNORMAL LOW (ref 80.0–100.0)
Monocytes Absolute: 0.3 10*3/uL (ref 0.1–1.0)
Monocytes Relative: 6 %
Neutro Abs: 3.2 10*3/uL (ref 1.7–7.7)
Neutrophils Relative %: 56 %
Platelets: 387 10*3/uL (ref 150–400)
RBC: 5.07 MIL/uL (ref 3.87–5.11)
RDW: 14.8 % (ref 11.5–15.5)
WBC: 5.6 10*3/uL (ref 4.0–10.5)
nRBC: 0 % (ref 0.0–0.2)

## 2021-06-08 LAB — TSH: TSH: 1.131 u[IU]/mL (ref 0.350–4.500)

## 2021-06-08 LAB — VITAMIN B12: Vitamin B-12: 455 pg/mL (ref 180–914)

## 2021-06-08 LAB — FOLATE: Folate: 32 ng/mL (ref >5.9)

## 2021-06-08 LAB — HEMOGLOBIN A1C
Hgb A1c MFr Bld: 9.8 % — ABNORMAL HIGH (ref 4.8–5.6)
Mean Plasma Glucose: 234.56 mg/dL

## 2021-06-08 LAB — T4, FREE: Free T4: 0.96 ng/dL (ref 0.61–1.12)

## 2021-06-08 LAB — VITAMIN D 25 HYDROXY (VIT D DEFICIENCY, FRACTURES): Vit D, 25-Hydroxy: 27.32 ng/mL — ABNORMAL LOW (ref 30–100)

## 2021-06-09 LAB — INSULIN, RANDOM: Insulin: 7.4 u[IU]/mL (ref 2.6–24.9)

## 2021-06-14 ENCOUNTER — Ambulatory Visit (INDEPENDENT_AMBULATORY_CARE_PROVIDER_SITE_OTHER): Payer: Medicaid Other | Admitting: Family Medicine

## 2021-06-14 ENCOUNTER — Encounter (INDEPENDENT_AMBULATORY_CARE_PROVIDER_SITE_OTHER): Payer: Self-pay | Admitting: Family Medicine

## 2021-06-14 ENCOUNTER — Other Ambulatory Visit: Payer: Self-pay

## 2021-06-14 VITALS — BP 125/87 | HR 64 | Temp 98.4°F | Ht <= 58 in | Wt 188.0 lb

## 2021-06-14 DIAGNOSIS — E1169 Type 2 diabetes mellitus with other specified complication: Secondary | ICD-10-CM

## 2021-06-14 DIAGNOSIS — D509 Iron deficiency anemia, unspecified: Secondary | ICD-10-CM

## 2021-06-14 DIAGNOSIS — I152 Hypertension secondary to endocrine disorders: Secondary | ICD-10-CM

## 2021-06-14 DIAGNOSIS — E118 Type 2 diabetes mellitus with unspecified complications: Secondary | ICD-10-CM

## 2021-06-14 DIAGNOSIS — E1165 Type 2 diabetes mellitus with hyperglycemia: Secondary | ICD-10-CM | POA: Diagnosis not present

## 2021-06-14 DIAGNOSIS — E559 Vitamin D deficiency, unspecified: Secondary | ICD-10-CM

## 2021-06-14 DIAGNOSIS — E1159 Type 2 diabetes mellitus with other circulatory complications: Secondary | ICD-10-CM | POA: Diagnosis not present

## 2021-06-14 DIAGNOSIS — Z6841 Body Mass Index (BMI) 40.0 and over, adult: Secondary | ICD-10-CM

## 2021-06-14 DIAGNOSIS — E669 Obesity, unspecified: Secondary | ICD-10-CM

## 2021-06-14 DIAGNOSIS — Q688 Other specified congenital musculoskeletal deformities: Secondary | ICD-10-CM

## 2021-06-14 DIAGNOSIS — Z7984 Long term (current) use of oral hypoglycemic drugs: Secondary | ICD-10-CM

## 2021-06-14 DIAGNOSIS — E782 Mixed hyperlipidemia: Secondary | ICD-10-CM

## 2021-06-14 MED ORDER — VITAMIN D (ERGOCALCIFEROL) 1.25 MG (50000 UNIT) PO CAPS: 50000.0000 [IU] | ORAL_CAPSULE | ORAL | 0 refills | Status: DC

## 2021-06-15 NOTE — Progress Notes (Deleted)
Chief Complaint:   OBESITY Robin Vazquez is here to discuss her progress with her obesity treatment plan along with follow-up of her obesity related diagnoses. Robin Vazquez is on {MWMwtlossportion/plan2:23431} and states she is following her eating plan approximately ***% of the time. Robin Vazquez states she is *** *** minutes *** times per week.  Today's visit was #: *** Starting weight: *** Starting date: *** Today's weight: *** Today's date: 06/14/2021 Total lbs lost to date: *** Total lbs lost since last in-office visit: ***  Interim History: ***  Subjective:   1. Vitamin D deficiency ***  2. 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 Hypertension associated with diabetes ([place]) ***  4. Mixed diabetic hyperlipidemia associated with type 2 diabetes mellitus (HCC) ***  5. Iron deficiency anemia, unspecified iron deficiency anemia type ***  6. Obesity with current BMI of 43.7 ***   Assessment/Plan:   1. Vitamin D deficiency ***  2. 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 Hypertension associated with diabetes ([place]) ***  4. Mixed diabetic hyperlipidemia associated with type 2 diabetes mellitus (HCC) ***  5. Iron deficiency anemia, unspecified iron deficiency anemia type ***  6. Obesity with current BMI of 43.7 ***  Robin Vazquez {CHL AMB IS/IS NOT:[id number]} currently in the action stage of change. As such, her goal is to {MWMwtloss#1:210800005}. She has agreed to {MWMwtlossportion/plan2:23431}.   Exercise goals: {MWM EXERCISE RECS:23473}  Behavioral modification strategies: {MWMwtlossdietstrategies3:23432}.  Robin Vazquez has agreed to follow-up with our clinic in {NUMBER 1-10:22536} weeks. She was informed of the importance of frequent follow-up visits to maximize her success with intensive lifestyle modifications for her multiple health conditions.   ***delete paragraph if no labs orderedRachelle was  informed we would discuss her lab results at her next visit unless there is a critical issue that needs to be addressed sooner. Robin Vazquez agreed to keep her next visit at the agreed upon time to discuss these results.  Objective:   Blood pressure 125/87, pulse 64, temperature 98.4 F (36.9 C), height 4\' 7"  (1.397 m), weight 188 lb (85.3 kg), SpO2 99 %. Body mass index is 43.7 kg/m.  General: Cooperative, alert, well developed, in no acute distress. HEENT: Conjunctivae and lids unremarkable. Cardiovascular: Regular rhythm.  Lungs: Normal work of breathing. Neurologic: No focal deficits.   Lab Results  Component Value Date   CREATININE 0.34 (L) 06/08/2021   BUN 9 06/08/2021   NA 134 (L) 06/08/2021   K 3.6 06/08/2021   CL 103 06/08/2021   CO2 19 (L) 06/08/2021   Lab Results  Component Value Date   ALT 22 06/08/2021   AST 18 06/08/2021   ALKPHOS 55 06/08/2021   BILITOT 0.9 06/08/2021   Lab Results  Component Value Date   HGBA1C 9.8 (H) 06/08/2021   HGBA1C 8.1 (H) 03/13/2021   HGBA1C 8.8 (H) 11/17/2020   HGBA1C 8.1 (H) 08/04/2020   HGBA1C 7.3 (H) 04/04/2020   No results found for: INSULIN Lab Results  Component Value Date   TSH 1.131 06/08/2021   Lab Results  Component Value Date   CHOL 122 06/08/2021   HDL 37 (L) 06/08/2021   LDLCALC 76 06/08/2021   TRIG 46 06/08/2021   CHOLHDL 3.3 06/08/2021   Lab Results  Component Value Date   VD25OH 27.32 (L) 06/08/2021   VD25OH 38.30 04/04/2020   VD25OH 30.84 11/02/2019   Lab Results  Component Value Date   WBC 5.6 06/08/2021   HGB 12.7  06/08/2021   HCT 40.4 06/08/2021   MCV 79.7 (L) 06/08/2021   PLT 387 06/08/2021   No results found for: IRON, TIBC, FERRITIN  Obesity Behavioral Intervention:   Approximately 15 minutes were spent on the discussion below.  ASK: We discussed the diagnosis of obesity with Robin Vazquez today and Robin Vazquez agreed to give [place] permission to discuss obesity behavioral modification therapy  today.  ASSESS: Robin Vazquez has the diagnosis of obesity and her BMI today is ***. Robin Vazquez {ACTION; IS/IS HYW:[id number]} in the action stage of change.   ADVISE: Robin Vazquez was educated on the multiple health risks of obesity as well as the benefit of weight loss to improve her health. She was advised of the need for long term treatment and the importance of lifestyle modifications to improve her current health and to decrease her risk of future health problems.  AGREE: Multiple dietary modification options and treatment options were discussed and Robin Vazquez agreed to follow the recommendations documented in the above note.  ARRANGE: Robin Vazquez was educated on the importance of frequent visits to treat obesity as outlined per CMS and USPSTF guidelines and agreed to schedule her next follow up appointment today.  Attestation Statements:   Reviewed by clinician on day of visit: allergies, medications, problem list, medical history, surgical history, family history, social history, and previous encounter notes.  ***(delete if time-based billing not used)Time spent on visit including pre-visit chart review and post-visit care and charting was *** minutes.   I, ***, am acting as transcriptionist for ***.  I have reviewed the above documentation for accuracy and completeness, and I agree with the above. -  ***

## 2021-06-15 NOTE — Progress Notes (Signed)
Chief Complaint:   OBESITY Robin Vazquez is here to discuss her progress with her obesity treatment plan along with follow-up of her obesity related diagnoses. Robin Vazquez is on the Category 2 Plan and states she is following her eating plan approximately 30% of the time. Robin Vazquez states she is not currently exercising.  Today's visit was #: 2 Starting weight: 186 lbs Starting date: 05/31/2021 Today's weight: 188 lbs Today's date: 06/14/2021 Total lbs lost to date: 0 Total lbs lost since last in-office visit: 0  Interim History: Robin Vazquez is here today for her first follow-up office visit since starting the program with [place]. All blood work/ lab tests that were recently ordered by myself or an outside provider were reviewed with patient today per their request. Extended time was spent counseling her on all new disease processes that were discovered or preexisting ones that are affected by BMI. She understands that many of these abnormalities will need to monitored regularly along with the current treatment plan of prudent dietary changes, in which we are making each and every office visit, to improve these health parameters. Breakfast is easy to follow, but she didn't follow for lunch or dinner. She notes it was "difficult" to change so much at once. After breakfast her hunger and cravings were well controlled, but not after lunch or dinner, and worse after dinner.  Subjective:   1. Vitamin D deficiency Robin Vazquez has a new diagnosis of Vit D deficiency. She is not taking anything for it, or OTC supplement. She notes fatigue and chronic joint pain. I discussed labs with the patient today.  2. 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 It has been a while, and she never has been on other medications for diabetes mellitus. She is taking Farxiga, acarbose, and glipizide. I discussed labs with the patient today.   3.  Hypertension associated with diabetes (Robin Vazquez) Robin Vazquez's blood pressure is almost at goal of <130/80 and serum creatinine is within normal limits. She is taking Norvasc and Vasotec. She is asymptomatic, and she has no concerns.  BP Readings from Last 3 Encounters:  06/14/21 125/87  05/31/21 102/84  12/09/19 (!) 144/98   4. Mixed diabetic hyperlipidemia associated with type 2 diabetes mellitus (HCC) Robin Vazquez's LDL is 76, essentially at goal of <70, and HDL is low at 37. She has no issues with Crestor, and she takes it most of the time. I discussed labs with the patient today.   5. Iron deficiency anemia, unspecified iron deficiency anemia type Robin Vazquez's CBC is stable. She is taking ferrous sulfate 325 mg 1 tablet PO most days. She takes it 80% of the time. She has no symptoms or concerns. I discussed labs with the patient today.  Assessment/Plan:  No orders of the defined types were placed in this encounter.   There are no discontinued medications.   Meds ordered this encounter  Medications   Vitamin D, Ergocalciferol, (DRISDOL) 1.25 MG (50000 UNIT) CAPS capsule    Sig: Take 1 capsule (50,000 Units total) by mouth every 7 (seven) days.    Dispense:  4 capsule    Refill:  0    30 d supply;  ** OV for RF **   Do not send RF request     1. Vitamin D deficiency Low Vitamin D level contributes to fatigue and are associated with obesity, breast, and colon cancer. Robin Vazquez agreed to start prescription Vitamin D  50,000 IU every week with no refills. She will follow-up for routine testing of Vitamin D, at least 2-3 times per year to avoid over-replacement.  - Vitamin D, Ergocalciferol, (DRISDOL) 1.25 MG (50000 UNIT) CAPS capsule; Take 1 capsule (50,000 Units total) by mouth every 7 (seven) days.  Dispense: 4 capsule; Refill: 0  2. Poorly controlled type 2 diabetes mellitus with complication ([place]) Robin Vazquez will follow up with her primary care physician in regards to starting insulin with such  a high A1c. She will continue to follow her prudent nutritional plan, decrease fast food and simple carbohydrates by following the meal plan. Extensive counseling was done today on detriments to her health.   3. Hypertension associated with diabetes ([place]) Robin Vazquez will decrease her salt intake and will continue with weight loss via her prudent nutritional plan. We will watch for signs of hypotension as she continues her lifestyle modifications.  4. Mixed diabetic hyperlipidemia associated with type 2 diabetes mellitus ([place]) We discussed patient's limitations and if she can swim/exercise or not, as she is limited. She will continue to follow her prudent nutritional plan.  5. Iron deficiency anemia, unspecified iron deficiency anemia type I encouraged Robin Vazquez to take Fe supplement mostly everyday. Orders and follow up as documented in patient record.  Counseling Iron is essential for our bodies to make red blood cells.  Reasons that someone may be deficient include: an iron-deficient diet (more likely in those following vegan or vegetarian diets), women with heavy menses, patients with GI disorders or poor absorption, patients that have had bariatric surgery, frequent blood donors, patients with cancer, and patients with heart disease.   Iron-rich foods include dark leafy greens, red and white meats, eggs, seafood, and beans.   Certain foods and drinks prevent your body from absorbing iron properly. Avoid eating these foods in the same meal as iron-rich foods or with iron supplements. These foods include: coffee, black tea, and red wine; milk, dairy products, and foods that are high in calcium; beans and soybeans; whole grains.  Constipation can be a side effect of iron supplementation. Increased water and fiber intake are helpful. Water goal: > 2 liters/day. Fiber goal: > 25 grams/day.  6. Obesity with current BMI of 43.7 Robin Vazquez is currently in the action stage of change. As such, her goal is to  continue with weight loss efforts. She has agreed to the Category 2 Plan.   Exercise goals: As is.  Behavioral modification strategies: decreasing simple carbohydrates, decreasing liquid calories, decreasing eating out, better snacking choices, and avoiding temptations.  Robin Vazquez has agreed to follow-up with our clinic in 2 weeks. She was informed of the importance of frequent follow-up visits to maximize her success with intensive lifestyle modifications for her multiple health conditions.   Objective:   Blood pressure 125/87, pulse 64, temperature 98.4 F (36.9 C), height 4\' 7"  (1.397 m), weight 188 lb (85.3 kg), SpO2 99 %. Body mass index is 43.7 kg/m.  General: Cooperative, alert, well developed, in no acute distress. HEENT: Conjunctivae and lids unremarkable. Cardiovascular: Regular rhythm.  Lungs: Normal work of breathing. Neurologic: No focal deficits.   Lab Results  Component Value Date   CREATININE 0.34 (L) 06/08/2021   BUN 9 06/08/2021   NA 134 (L) 06/08/2021   K 3.6 06/08/2021   CL 103 06/08/2021   CO2 19 (L) 06/08/2021   Lab Results  Component Value Date   ALT 22 06/08/2021   AST 18 06/08/2021   ALKPHOS 55 06/08/2021   BILITOT  0.9 06/08/2021   Lab Results  Component Value Date   HGBA1C 9.8 (H) 06/08/2021   HGBA1C 8.1 (H) 03/13/2021   HGBA1C 8.8 (H) 11/17/2020   HGBA1C 8.1 (H) 08/04/2020   HGBA1C 7.3 (H) 04/04/2020   No results found for: INSULIN Lab Results  Component Value Date   TSH 1.131 06/08/2021   Lab Results  Component Value Date   CHOL 122 06/08/2021   HDL 37 (L) 06/08/2021   LDLCALC 76 06/08/2021   TRIG 46 06/08/2021   CHOLHDL 3.3 06/08/2021   Lab Results  Component Value Date   VD25OH 27.32 (L) 06/08/2021   VD25OH 38.30 04/04/2020   VD25OH 30.84 11/02/2019   Lab Results  Component Value Date   WBC 5.6 06/08/2021   HGB 12.7 06/08/2021   HCT 40.4 06/08/2021   MCV 79.7 (L) 06/08/2021   PLT 387 06/08/2021   No results found  for: IRON, TIBC, FERRITIN  Attestation Statements:   Reviewed by clinician on day of visit: allergies, medications, problem list, medical history, surgical history, family history, social history, and previous encounter notes.   Wilhemena Durie, am acting as transcriptionist for [employer], DO.  I have reviewed the above documentation for accuracy and completeness, and I agree with the above. Marjory Sneddon, D.O.  The [place] was signed into law in 2016 which includes the topic of electronic health records.  This provides immediate access to information in MyChart.  This includes consultation notes, operative notes, office notes, lab results and pathology reports.  If you have any questions about what you read please let us know at your next visit so we can discuss your concerns and take corrective action if need be.  We are right here with you.

## 2021-06-28 ENCOUNTER — Other Ambulatory Visit: Payer: Self-pay

## 2021-06-28 ENCOUNTER — Encounter (INDEPENDENT_AMBULATORY_CARE_PROVIDER_SITE_OTHER): Payer: Self-pay | Admitting: Family Medicine

## 2021-06-28 ENCOUNTER — Ambulatory Visit (INDEPENDENT_AMBULATORY_CARE_PROVIDER_SITE_OTHER): Payer: Medicaid Other | Admitting: Family Medicine

## 2021-06-28 VITALS — BP 135/90 | HR 92 | Temp 98.3°F | Ht <= 58 in | Wt 185.0 lb

## 2021-06-28 DIAGNOSIS — E669 Obesity, unspecified: Secondary | ICD-10-CM

## 2021-06-28 DIAGNOSIS — I152 Hypertension secondary to endocrine disorders: Secondary | ICD-10-CM

## 2021-06-28 DIAGNOSIS — E1159 Type 2 diabetes mellitus with other circulatory complications: Secondary | ICD-10-CM | POA: Diagnosis not present

## 2021-06-28 DIAGNOSIS — E559 Vitamin D deficiency, unspecified: Secondary | ICD-10-CM

## 2021-06-28 DIAGNOSIS — Z6841 Body Mass Index (BMI) 40.0 and over, adult: Secondary | ICD-10-CM

## 2021-06-28 MED ORDER — VITAMIN D (ERGOCALCIFEROL) 1.25 MG (50000 UNIT) PO CAPS: 50000.0000 [IU] | ORAL_CAPSULE | ORAL | 0 refills | Status: DC

## 2021-06-29 NOTE — Progress Notes (Signed)
Chief Complaint:   OBESITY Robin Vazquez is here to discuss her progress with her obesity treatment plan along with follow-up of her obesity related diagnoses. Robin Vazquez is on the Category 2 Plan and states she is following her eating plan approximately 60% of the time. Robin Vazquez states she is walking for 5-10 minutes 3 times per week.  Today's visit was #: 3 Starting weight: 186 lbs Starting date: 05/31/2021 Today's weight: 185 lbs Today's date: 06/28/2021 Total lbs lost to date: 1 Total lbs lost since last in-office visit: 3  Interim History: Robin Vazquez drank more water and zero calorie sodas than her high calorie liquids.  She ate more food on the plan and only ate out 3 times this past week. She is bored with breakfast and she desires cereal. She still eats a lot of simple carbohydrates.   Subjective:   1. Hypertension associated with type 2 diabetes mellitus ([place]) Irie's blood pressure is above goal, but she hasn't taken her blood pressure medications yet today.  She is asymptomatic with no concerns.   2. Vitamin D deficiency Robin Vazquez was started on Ergocalciferol at her last office visit, and she is tolerating it well with no side effects or concerns.  Energy levels- have not changed.     Assessment/Plan:   Medications Discontinued During This Encounter  Medication Reason   Vitamin D, Ergocalciferol, (DRISDOL) 1.25 MG (50000 UNIT) CAPS capsule Reorder     Meds ordered this encounter  Medications   Vitamin D, Ergocalciferol, (DRISDOL) 1.25 MG (50000 UNIT) CAPS capsule    Sig: Take 1 capsule (50,000 Units total) by mouth every 7 (seven) days.    Dispense:  4 capsule    Refill:  0    30 d supply;   OV for RF   Do not send RF request     1. Hypertension associated with type 2 diabetes mellitus ([place]) Robin Vazquez is to take her blood pressure medications every morning. She will check her blood pressure monitoring at home every other day. We will monitor as she continues her  lifestyle modifications.   2. Vitamin D deficiency We will refill prescription Vitamin D for 1 month. Robin Vazquez will follow-up for routine testing of Vitamin D, at least 2-3 times per year to avoid over-replacement.  - Vitamin D, Ergocalciferol, (DRISDOL) 1.25 MG (50000 UNIT) CAPS capsule; Take 1 capsule (50,000 Units total) by mouth every 7 (seven) days.  Dispense: 4 capsule; Refill: 0   3. Obesity, current BMI 43.00 Robin Vazquez is currently in the action stage of change. As such, her goal is to continue with weight loss efforts. She has agreed to the Category 2 Plan with breakfast options.    Meal prep and planning were discussed with the patient, and strategies were reviewed. Intentional eating and mindful eating were discussed.  Exercise goals: As is.  Behavioral modification strategies: decreasing simple carbohydrates, decreasing eating out, and meal planning and cooking strategies.  Robin Vazquez has agreed to follow-up with our clinic in 2 weeks. She was informed of the importance of frequent follow-up visits to maximize her success with intensive lifestyle modifications for her multiple health conditions.    Objective:   Blood pressure 135/90, pulse 92, temperature 98.3 F (36.8 C), height 4\' 7"  (1.397 m), weight 185 lb (83.9 kg), SpO2 99 %. Body mass index is 43 kg/m.  General: Cooperative, alert, well developed, in no acute distress. HEENT: Conjunctivae and lids unremarkable. Cardiovascular: Regular rhythm.  Lungs: Normal work of breathing. Neurologic: No focal deficits.  Lab Results  Component Value Date   CREATININE 0.34 (L) 06/08/2021   BUN 9 06/08/2021   NA 134 (L) 06/08/2021   K 3.6 06/08/2021   CL 103 06/08/2021   CO2 19 (L) 06/08/2021   Lab Results  Component Value Date   ALT 22 06/08/2021   AST 18 06/08/2021   ALKPHOS 55 06/08/2021   BILITOT 0.9 06/08/2021   Lab Results  Component Value Date   HGBA1C 9.8 (H) 06/08/2021   HGBA1C 8.1 (H) 03/13/2021    HGBA1C 8.8 (H) 11/17/2020   HGBA1C 8.1 (H) 08/04/2020   HGBA1C 7.3 (H) 04/04/2020   No results found for: INSULIN Lab Results  Component Value Date   TSH 1.131 06/08/2021   Lab Results  Component Value Date   CHOL 122 06/08/2021   HDL 37 (L) 06/08/2021   LDLCALC 76 06/08/2021   TRIG 46 06/08/2021   CHOLHDL 3.3 06/08/2021   Lab Results  Component Value Date   VD25OH 27.32 (L) 06/08/2021   VD25OH 38.30 04/04/2020   VD25OH 30.84 11/02/2019   Lab Results  Component Value Date   WBC 5.6 06/08/2021   HGB 12.7 06/08/2021   HCT 40.4 06/08/2021   MCV 79.7 (L) 06/08/2021   PLT 387 06/08/2021   No results found for: IRON, TIBC, FERRITIN  Attestation Statements:   Reviewed by clinician on day of visit: allergies, medications, problem list, medical history, surgical history, family history, social history, and previous encounter notes.  Wilhemena Durie, am acting as transcriptionist for [employer], DO.  I have reviewed the above documentation for accuracy and completeness, and I agree with the above. Marjory Sneddon, D.O.  The [place] was signed into law in 2016 which includes the topic of electronic health records.  This provides immediate access to information in MyChart.  This includes consultation notes, operative notes, office notes, lab results and pathology reports.  If you have any questions about what you read please let us know at your next visit so we can discuss your concerns and take corrective action if need be.  We are right here with you.

## 2021-07-12 ENCOUNTER — Other Ambulatory Visit: Payer: Self-pay

## 2021-07-12 ENCOUNTER — Encounter (INDEPENDENT_AMBULATORY_CARE_PROVIDER_SITE_OTHER): Payer: Self-pay | Admitting: Physician Assistant

## 2021-07-12 ENCOUNTER — Ambulatory Visit (INDEPENDENT_AMBULATORY_CARE_PROVIDER_SITE_OTHER): Payer: Medicaid Other | Admitting: Physician Assistant

## 2021-07-12 VITALS — BP 131/83 | HR 88 | Temp 98.1°F | Ht <= 58 in | Wt 186.0 lb

## 2021-07-12 DIAGNOSIS — Z6841 Body Mass Index (BMI) 40.0 and over, adult: Secondary | ICD-10-CM

## 2021-07-12 DIAGNOSIS — E1169 Type 2 diabetes mellitus with other specified complication: Secondary | ICD-10-CM | POA: Diagnosis not present

## 2021-07-12 DIAGNOSIS — E1165 Type 2 diabetes mellitus with hyperglycemia: Secondary | ICD-10-CM | POA: Diagnosis not present

## 2021-07-12 DIAGNOSIS — E559 Vitamin D deficiency, unspecified: Secondary | ICD-10-CM

## 2021-07-12 DIAGNOSIS — E669 Obesity, unspecified: Secondary | ICD-10-CM | POA: Diagnosis not present

## 2021-07-12 MED ORDER — VITAMIN D (ERGOCALCIFEROL) 1.25 MG (50000 UNIT) PO CAPS: 50000.0000 [IU] | ORAL_CAPSULE | ORAL | 0 refills | Status: DC

## 2021-07-12 NOTE — Progress Notes (Signed)
Chief Complaint:   OBESITY Robin Vazquez is here to discuss her progress with her obesity treatment plan along with follow-up of her obesity related diagnoses. Robin Vazquez is on the Category 2 Plan with breakfast options and states she is following her eating plan approximately 50% of the time. Robin Vazquez states she is walking 10 minutes 3 times per week.  Today's visit was #: 4 Starting weight: 186 lbs Starting date: 05/31/2021 Today's weight: 186 lbs Today's date: 07/12/2021 Total lbs lost to date: 0 Total lbs lost since last in-office visit: 0  Interim History: Pt just found out her mom has cancer. She is eating out more often and eating fries. She is also eating mac n cheese with dinner. Otherwise, she is following the plan well.   Subjective:   1. Vitamin D deficiency Pt is on Vit D weekly. Her last Vit D level was 27.321.  2. Poorly controlled type 2 diabetes mellitus with complication (HCC) No hypoglycemia. Pt's fasting BS run 130-150. She is managed by her PCP.  Assessment/Plan:   1. Vitamin D deficiency Low Vitamin D level contributes to fatigue and are associated with obesity, breast, and colon cancer. She agrees to continue to take prescription Vitamin D 50,000 IU every week and will follow-up for routine testing of Vitamin D, at least 2-3 times per year to avoid over-replacement.  Refill- Vitamin D, Ergocalciferol, (DRISDOL) 1.25 MG (50000 UNIT) CAPS capsule; Take 1 capsule (50,000 Units total) by mouth every 7 (seven) days.  Dispense: 4 capsule; Refill: 0  2. Poorly controlled type 2 diabetes mellitus with complication (HCC) Good blood sugar control is important to decrease the likelihood of diabetic complications such as nephropathy, neuropathy, limb loss, blindness, coronary artery disease, and death. Intensive lifestyle modification including diet, exercise and weight loss are the first line of treatment for diabetes. F/u with PCP.  3. Obesity, current BMI  43.23 Robin Vazquez is currently in the action stage of change. As such, her goal is to continue with weight loss efforts. She has agreed to the Category 2 Plan with breakfast options.   Exercise goals:  As is  Behavioral modification strategies: decreasing simple carbohydrates and meal planning and cooking strategies.  Robin Vazquez has agreed to follow-up with our clinic in 2 weeks. She was informed of the importance of frequent follow-up visits to maximize her success with intensive lifestyle modifications for her multiple health conditions.   Objective:   Blood pressure 131/83, pulse 88, temperature 98.1 F (36.7 C), height _0  (1.397 m), weight 186 lb (84.4 kg), SpO2 95 %. Body mass index is 43.23 kg/m.  General: Cooperative, alert, well developed, in no acute distress. HEENT: Conjunctivae and lids unremarkable. Cardiovascular: Regular rhythm.  Lungs: Normal work of breathing. Neurologic: No focal deficits.   Lab Results  Component Value Date   CREATININE 0.34 (L) 06/08/2021   BUN 9 06/08/2021   NA 134 (L) 06/08/2021   K 3.6 06/08/2021   CL 103 06/08/2021   CO2 19 (L) 06/08/2021   Lab Results  Component Value Date   ALT 22 06/08/2021   AST 18 06/08/2021   ALKPHOS 55 06/08/2021   BILITOT 0.9 06/08/2021   Lab Results  Component Value Date   HGBA1C 9.8 (H) 06/08/2021   HGBA1C 8.1 (H) 03/13/2021   HGBA1C 8.8 (H) 11/17/2020   HGBA1C 8.1 (H) 08/04/2020   HGBA1C 7.3 (H) 04/04/2020   No results found for: INSULIN Lab Results  Component Value Date   TSH 1.131 06/08/2021  Lab Results  Component Value Date   CHOL 122 06/08/2021   HDL 37 (L) 06/08/2021   LDLCALC 76 06/08/2021   TRIG 46 06/08/2021   CHOLHDL 3.3 06/08/2021   Lab Results  Component Value Date   VD25OH 27.32 (L) 06/08/2021   VD25OH 38.30 04/04/2020   VD25OH 30.84 11/02/2019   Lab Results  Component Value Date   WBC 5.6 06/08/2021   HGB 12.7 06/08/2021   HCT 40.4 06/08/2021   MCV 79.7 (L) 06/08/2021    PLT 387 06/08/2021    Attestation Statements:   Reviewed by clinician on day of visit: allergies, medications, problem list, medical history, surgical history, family history, social history, and previous encounter notes.  Coral Ceo, CMA, am acting as transcriptionist for [employer], PA-C.  I have reviewed the above documentation for accuracy and completeness, and I agree with the above. Abby Potash, PA-C

## 2021-07-27 ENCOUNTER — Ambulatory Visit (INDEPENDENT_AMBULATORY_CARE_PROVIDER_SITE_OTHER): Payer: Medicaid Other | Admitting: Family Medicine

## 2021-08-10 ENCOUNTER — Encounter (INDEPENDENT_AMBULATORY_CARE_PROVIDER_SITE_OTHER): Payer: Self-pay | Admitting: Family Medicine

## 2021-08-10 ENCOUNTER — Ambulatory Visit (INDEPENDENT_AMBULATORY_CARE_PROVIDER_SITE_OTHER): Payer: Medicaid Other | Admitting: Family Medicine

## 2021-08-10 ENCOUNTER — Other Ambulatory Visit: Payer: Self-pay

## 2021-08-10 VITALS — BP 122/88 | HR 89 | Temp 98.2°F | Wt 186.0 lb

## 2021-08-10 DIAGNOSIS — E1169 Type 2 diabetes mellitus with other specified complication: Secondary | ICD-10-CM

## 2021-08-10 DIAGNOSIS — E669 Obesity, unspecified: Secondary | ICD-10-CM

## 2021-08-10 DIAGNOSIS — Z7984 Long term (current) use of oral hypoglycemic drugs: Secondary | ICD-10-CM

## 2021-08-10 DIAGNOSIS — E559 Vitamin D deficiency, unspecified: Secondary | ICD-10-CM | POA: Diagnosis not present

## 2021-08-10 DIAGNOSIS — E1165 Type 2 diabetes mellitus with hyperglycemia: Secondary | ICD-10-CM

## 2021-08-10 DIAGNOSIS — Z6841 Body Mass Index (BMI) 40.0 and over, adult: Secondary | ICD-10-CM

## 2021-08-10 MED ORDER — VITAMIN D (ERGOCALCIFEROL) 1.25 MG (50000 UNIT) PO CAPS: 50000.0000 [IU] | ORAL_CAPSULE | ORAL | 0 refills | Status: DC

## 2021-08-10 MED ORDER — RYBELSUS 3 MG PO TABS: 3.0000 mg | ORAL_TABLET | Freq: Every day | ORAL | 0 refills | Status: DC

## 2021-08-14 ENCOUNTER — Telehealth (INDEPENDENT_AMBULATORY_CARE_PROVIDER_SITE_OTHER): Payer: Self-pay | Admitting: Family Medicine

## 2021-08-14 ENCOUNTER — Encounter (INDEPENDENT_AMBULATORY_CARE_PROVIDER_SITE_OTHER): Payer: Self-pay

## 2021-08-14 NOTE — Telephone Encounter (Signed)
Prior authorization denied for Rybelsus. Per insurance:(i) You have failed two preferred drugs as confirmed by claims history or submission of medical records: Byetta, Trulicity, Victoza, and Ozempic.(ii) You cannot use two preferred drugs (please specify contraindication or intolerance). ?Patient sent denial message via mychart.  ?

## 2021-08-16 NOTE — Progress Notes (Signed)
? ? ? ?Chief Complaint:  ? ?OBESITY ?Robin Vazquez is here to discuss her progress with her obesity treatment plan along with follow-up of her obesity related diagnoses. Robin Vazquez is on the Category 2 Plan with breakfast options and states she is following her eating plan approximately 50% of the time. Robin Vazquez states she is walking for 10 minutes 3 times per week. ? ?Today's visit was #: 5 ?Starting weight: 186 lbs ?Starting date: 05/31/2021 ?Today's weight: 186 lbs ?Today's date: 08/10/2021 ?Total lbs lost to date: 0 ?Total lbs lost since last in-office visit: 0 ? ?Interim History: Robin Vazquez says that it has been difficult to follow the meal plan at dinner.  Last night she had a cheeseburger and fries from "[employer]".  She eats breakfast on plan, sometimes lunch, but only 30% or so on plan at dinner.  She finds fast food more convenient. ? ?Subjective:  ? ?1. Poorly controlled type 2 diabetes mellitus with complication ([place]) ?A1c was 9.8 two months ago.  FBS 118-150.  No low BS or symptoms of concern.  Management per PCP.  No personal history of pancreatitis.  Patient is adopted, so her family history is unknown. ? ?2. Vitamin D deficiency ?She is currently taking prescription vitamin D 50,000 IU each week. She denies nausea, vomiting or muscle weakness. ? ?Assessment/Plan:  ?No orders of the defined types were placed in this encounter. ? ? ?Medications Discontinued During This Encounter  ?Medication Reason  ? Vitamin D, Ergocalciferol, (DRISDOL) 1.25 MG (50000 UNIT) CAPS capsule Reorder  ?  ? ?Meds ordered this encounter  ?Medications  ? Semaglutide (RYBELSUS) 3 MG TABS  ?  Sig: Take 3 mg by mouth daily.  ?  Dispense:  30 tablet  ?  Refill:  0  ? Vitamin D, Ergocalciferol, (DRISDOL) 1.25 MG (50000 UNIT) CAPS capsule  ?  Sig: Take 1 capsule (50,000 Units total) by mouth every 7 (seven) days.  ?  Dispense:  4 capsule  ?  Refill:  0  ?  30 d supply;   OV for RF   Do not send RF request  ?  ? ?1. Poorly controlled type 2  diabetes mellitus with complication ([place]) ?Continue Farxiga, glucotrol, and Precose per PCP.  Start Rybelsus 3 mg daily.  She agrees to start after risks and benefits of medication were discussed with her. ? ?- Start Semaglutide (RYBELSUS) 3 MG TABS; Take 3 mg by mouth daily.  Dispense: 30 tablet; Refill: 0 ? ?2. Vitamin D deficiency ?Low Vitamin D level contributes to fatigue and are associated with obesity, breast, and colon cancer. She agrees to continue to take prescription Vitamin D '@50'$ ,000 IU every week and will follow-up for routine testing of Vitamin D, at least 2-3 times per year to avoid over-replacement. ? ?- Refill Vitamin D, Ergocalciferol, (DRISDOL) 1.25 MG (50000 UNIT) CAPS capsule; Take 1 capsule (50,000 Units total) by mouth every 7 (seven) days.  Dispense: 4 capsule; Refill: 0 ? ?3. Obesity with current BMI of 43.23 ? ?Robin Vazquez is currently in the action stage of change. As such, her goal is to continue with weight loss efforts. She has agreed to the Category 2 Plan with breakfast options.  ? ?Less eating out. ? ?Exercise goals:  As is. ? ?Behavioral modification strategies: increasing lean protein intake, decreasing simple carbohydrates, and planning for success. ? ?Robin Vazquez has agreed to follow-up with our clinic in 3 weeks with 1st available. She was informed of the importance of frequent follow-up visits to maximize her  success with intensive lifestyle modifications for her multiple health conditions.  ? ?Objective:  ? ?Blood pressure 122/88, pulse 89, temperature 98.2 ?F (36.8 ?C), weight 186 lb (84.4 kg), SpO2 96 %. ?Body mass index is 43.23 kg/m?. ? ?General: Cooperative, alert, well developed, in no acute distress. ?HEENT: Conjunctivae and lids unremarkable. ?Cardiovascular: Regular rhythm.  ?Lungs: Normal work of breathing. ?Neurologic: No focal deficits.  ? ?Lab Results  ?Component Value Date  ? CREATININE 0.34 (L) 06/08/2021  ? BUN 9 06/08/2021  ? NA 134 (L) 06/08/2021  ? K 3.6  06/08/2021  ? CL 103 06/08/2021  ? CO2 19 (L) 06/08/2021  ? ?Lab Results  ?Component Value Date  ? ALT 22 06/08/2021  ? AST 18 06/08/2021  ? ALKPHOS 55 06/08/2021  ? BILITOT 0.9 06/08/2021  ? ?Lab Results  ?Component Value Date  ? HGBA1C 9.8 (H) 06/08/2021  ? HGBA1C 8.1 (H) 03/13/2021  ? HGBA1C 8.8 (H) 11/17/2020  ? HGBA1C 8.1 (H) 08/04/2020  ? HGBA1C 7.3 (H) 04/04/2020  ? ?Lab Results  ?Component Value Date  ? TSH 1.131 06/08/2021  ? ?Lab Results  ?Component Value Date  ? CHOL 122 06/08/2021  ? HDL 37 (L) 06/08/2021  ? [place] 76 06/08/2021  ? TRIG 46 06/08/2021  ? CHOLHDL 3.3 06/08/2021  ? ?Lab Results  ?Component Value Date  ? VD25OH 27.32 (L) 06/08/2021  ? VD25OH 38.30 04/04/2020  ? VD25OH 30.84 11/02/2019  ? ?Lab Results  ?Component Value Date  ? WBC 5.6 06/08/2021  ? HGB 12.7 06/08/2021  ? HCT 40.4 06/08/2021  ? MCV 79.7 (L) 06/08/2021  ? PLT 387 06/08/2021  ? ?Attestation Statements:  ? ?Reviewed by clinician on day of visit: allergies, medications, problem list, medical history, surgical history, family history, social history, and previous encounter notes. ? ?I, Water quality scientist, CMA, am acting as transcriptionist for [employer], DO. ? ?I have reviewed the above documentation for accuracy and completeness, and I agree with the above. Marjory Sneddon, D.O. ? ?The [place] was signed into law in 2016 which includes the topic of electronic health records.  This provides immediate access to information in MyChart.  This includes consultation notes, operative notes, office notes, lab results and pathology reports.  If you have any questions about what you read please let us know at your next visit so we can discuss your concerns and take corrective action if need be.  We are right here with you. ? ?

## 2021-08-31 ENCOUNTER — Ambulatory Visit (INDEPENDENT_AMBULATORY_CARE_PROVIDER_SITE_OTHER): Payer: Medicaid Other | Admitting: Physician Assistant

## 2021-08-31 ENCOUNTER — Encounter (INDEPENDENT_AMBULATORY_CARE_PROVIDER_SITE_OTHER): Payer: Self-pay | Admitting: Physician Assistant

## 2021-08-31 VITALS — BP 130/100 | HR 80 | Temp 98.2°F | Ht <= 58 in | Wt 184.0 lb

## 2021-08-31 DIAGNOSIS — E669 Obesity, unspecified: Secondary | ICD-10-CM | POA: Diagnosis not present

## 2021-08-31 DIAGNOSIS — E559 Vitamin D deficiency, unspecified: Secondary | ICD-10-CM | POA: Diagnosis not present

## 2021-08-31 DIAGNOSIS — E1165 Type 2 diabetes mellitus with hyperglycemia: Secondary | ICD-10-CM

## 2021-08-31 DIAGNOSIS — Z6841 Body Mass Index (BMI) 40.0 and over, adult: Secondary | ICD-10-CM

## 2021-08-31 DIAGNOSIS — E1169 Type 2 diabetes mellitus with other specified complication: Secondary | ICD-10-CM

## 2021-08-31 MED ORDER — VITAMIN D (ERGOCALCIFEROL) 1.25 MG (50000 UNIT) PO CAPS: 50000.0000 [IU] | ORAL_CAPSULE | ORAL | 0 refills | Status: DC

## 2021-09-04 NOTE — Progress Notes (Signed)
? ? ? ?Chief Complaint:  ? ?OBESITY ?Robin Vazquez is here to discuss her progress with her obesity treatment plan along with follow-up of her obesity related diagnoses. Robin Vazquez is on the Category 2 Plan with breakfast options and states she is following her eating plan approximately 50% of the time. Robin Vazquez states she is walking for 10 minutes 3 times per week. ? ?Today's visit was #: 6 ?Starting weight: 186 lbs ?Starting date: 05/31/2021 ?Today's weight: 184 lbs ?Today's date: 08/31/2021 ?Total lbs lost to date: 2 lbs ?Total lbs lost since last in-office visit: 2 lbs ? ?Interim History: Robin Vazquez is not eating much the past few days due to her mom passing away 2 days ago. She has a good support system at home.  ? ?Subjective:  ? ?1. Poorly controlled type 2 diabetes mellitus with complication ([place]) ?Robin Vazquez's last A1C was 9.8. Her fasting blood sugar was in the range of 150's. She is checking her blood sugars 3-4 times per week. She did not start Rybelsus as it was not approved.  ? ?2. Vitamin D deficiency ?Robin Vazquez is on Vitamin D currently and she is taking as prescribed. She notes no side effects.  ? ?Assessment/Plan:  ? ?1. Poorly controlled type 2 diabetes mellitus with complication ([place]) ?We will attempt to get Rybelsus approved. Good blood sugar control is important to decrease the likelihood of diabetic complications such as nephropathy, neuropathy, limb loss, blindness, coronary artery disease, and death. Intensive lifestyle modification including diet, exercise and weight loss are the first line of treatment for diabetes.  ? ?2. Vitamin D deficiency ?Low Vitamin D level contributes to fatigue and are associated with obesity, breast, and colon cancer. We will refill prescription Vitamin D 50,000 IU every week for 1 month with no refills and Robin Vazquez will follow-up for routine testing of Vitamin D, at least 2-3 times per year to avoid over-replacement. ? ?- Vitamin D, Ergocalciferol, (DRISDOL) 1.25 MG (50000  UNIT) CAPS capsule; Take 1 capsule (50,000 Units total) by mouth every 7 (seven) days.  Dispense: 4 capsule; Refill: 0 ? ?3. Obesity with current BMI of 42.77 ?Robin Vazquez is currently in the action stage of change. As such, her goal is to continue with weight loss efforts. She has agreed to practicing portion control and making smarter food choices, such as increasing vegetables and decreasing simple carbohydrates.  ? ?Exercise goals:  As is.  ? ?Behavioral modification strategies: increasing lean protein intake and no skipping meals. ? ?Annalena has agreed to follow-up with our clinic in 3 weeks. She was informed of the importance of frequent follow-up visits to maximize her success with intensive lifestyle modifications for her multiple health conditions.  ? ?Objective:  ? ?Blood pressure (!) 130/100, pulse 80, temperature 98.2 ?F (36.8 ?C), height '4\' 7"'$  (1.397 m), weight 184 lb (83.5 kg), SpO2 99 %. ?Body mass index is 42.77 kg/m?. ? ?General: Cooperative, alert, well developed, in no acute distress. ?HEENT: Conjunctivae and lids unremarkable. ?Cardiovascular: Regular rhythm.  ?Lungs: Normal work of breathing. ?Neurologic: No focal deficits.  ? ?Lab Results  ?Component Value Date  ? CREATININE 0.34 (L) 06/08/2021  ? BUN 9 06/08/2021  ? NA 134 (L) 06/08/2021  ? K 3.6 06/08/2021  ? CL 103 06/08/2021  ? CO2 19 (L) 06/08/2021  ? ?Lab Results  ?Component Value Date  ? ALT 22 06/08/2021  ? AST 18 06/08/2021  ? ALKPHOS 55 06/08/2021  ? BILITOT 0.9 06/08/2021  ? ?Lab Results  ?Component Value Date  ? HGBA1C 9.8 (  H) 06/08/2021  ? HGBA1C 8.1 (H) 03/13/2021  ? HGBA1C 8.8 (H) 11/17/2020  ? HGBA1C 8.1 (H) 08/04/2020  ? HGBA1C 7.3 (H) 04/04/2020  ? ?No results found for: INSULIN ?Lab Results  ?Component Value Date  ? TSH 1.131 06/08/2021  ? ?Lab Results  ?Component Value Date  ? CHOL 122 06/08/2021  ? HDL 37 (L) 06/08/2021  ? [place] 76 06/08/2021  ? TRIG 46 06/08/2021  ? CHOLHDL 3.3 06/08/2021  ? ?Lab Results  ?Component Value  Date  ? VD25OH 27.32 (L) 06/08/2021  ? VD25OH 38.30 04/04/2020  ? VD25OH 30.84 11/02/2019  ? ?Lab Results  ?Component Value Date  ? WBC 5.6 06/08/2021  ? HGB 12.7 06/08/2021  ? HCT 40.4 06/08/2021  ? MCV 79.7 (L) 06/08/2021  ? PLT 387 06/08/2021  ? ?No results found for: IRON, TIBC, FERRITIN ? ?Attestation Statements:  ? ?Reviewed by clinician on day of visit: allergies, medications, problem list, medical history, surgical history, family history, social history, and previous encounter notes. ? ?I, Tonye Pearson, am acting as Location manager for [employer], PA-C. ? ?I have reviewed the above documentation for accuracy and completeness, and I agree with the above. Abby Potash, PA-C ? ?

## 2021-09-19 ENCOUNTER — Ambulatory Visit (INDEPENDENT_AMBULATORY_CARE_PROVIDER_SITE_OTHER): Payer: Medicaid Other | Admitting: Physician Assistant

## 2021-09-21 ENCOUNTER — Ambulatory Visit (INDEPENDENT_AMBULATORY_CARE_PROVIDER_SITE_OTHER): Payer: Medicaid Other | Admitting: Physician Assistant

## 2021-10-12 ENCOUNTER — Ambulatory Visit (INDEPENDENT_AMBULATORY_CARE_PROVIDER_SITE_OTHER): Payer: Medicaid Other | Admitting: Nurse Practitioner

## 2021-10-12 ENCOUNTER — Encounter (INDEPENDENT_AMBULATORY_CARE_PROVIDER_SITE_OTHER): Payer: Self-pay | Admitting: Nurse Practitioner

## 2021-10-12 ENCOUNTER — Other Ambulatory Visit (INDEPENDENT_AMBULATORY_CARE_PROVIDER_SITE_OTHER): Payer: Self-pay | Admitting: Physician Assistant

## 2021-10-12 VITALS — BP 127/90 | HR 97 | Temp 98.4°F | Ht <= 58 in | Wt 185.0 lb

## 2021-10-12 DIAGNOSIS — E782 Mixed hyperlipidemia: Secondary | ICD-10-CM

## 2021-10-12 DIAGNOSIS — E559 Vitamin D deficiency, unspecified: Secondary | ICD-10-CM | POA: Diagnosis not present

## 2021-10-12 DIAGNOSIS — Z6841 Body Mass Index (BMI) 40.0 and over, adult: Secondary | ICD-10-CM

## 2021-10-12 DIAGNOSIS — E66813 Obesity, class 3: Secondary | ICD-10-CM

## 2021-10-12 DIAGNOSIS — E1169 Type 2 diabetes mellitus with other specified complication: Secondary | ICD-10-CM

## 2021-10-12 DIAGNOSIS — E1165 Type 2 diabetes mellitus with hyperglycemia: Secondary | ICD-10-CM

## 2021-10-12 DIAGNOSIS — E669 Obesity, unspecified: Secondary | ICD-10-CM

## 2021-10-12 MED ORDER — OZEMPIC (0.25 OR 0.5 MG/DOSE) 2 MG/1.5ML ~~LOC~~ SOPN: 0.5000 mg | PEN_INJECTOR | SUBCUTANEOUS | 0 refills | Status: DC

## 2021-10-17 NOTE — Progress Notes (Unsigned)
Chief Complaint:   OBESITY Robin Vazquez is here to discuss her progress with her obesity treatment plan along with follow-up of her obesity related diagnoses. Robin Vazquez is on practicing portion control and making smarter food choices, such as increasing vegetables and decreasing simple carbohydrates and states she is following her eating plan approximately 50% of the time. Robin Vazquez states she is walking for 10 minutes 3 times per week.  Today's visit was #: 7 Starting weight: 186 lbs Starting date: 05/31/2021 Today's weight: 185 lbs Today's date: 10/12/2021 Total lbs lost to date: 1 lb Total lbs lost since last in-office visit: 0  Interim History: Robin Vazquez's mother passed away Sep 14, 2021 due to cancer. She likes following PC/[place] better than Category 2. She is eating 2 meals and 1 snack per day. She is substituting a protein shake for breakfast. She is drinking diet sodas, coffee, and water.   Subjective:   1. Poorly controlled type 2 diabetes mellitus with complication (HCC) Valynn's last A1C was 9.8. She was prescribed Rybelsus after her last visit but wasn't able to start due to cost. She took Metformin in the past and stopped due to side effects.  2. Vitamin D deficiency Robin Vazquez's Vitamin D was 27.32. She is taking Vitamin D 50,000 IU weekly. She denies nausea, vomiting, and muscle weakness.   3. Mixed diabetic hyperlipidemia associated with type 2 diabetes mellitus ([place]) Robin Vazquez is currently taking Crestor 20 mg. She denies side effects.   Assessment/Plan:   1. Poorly controlled type 2 diabetes mellitus with complication ([place]) Robin Vazquez agrees to start Ozempic 0.25 mg. We discussed side effects. Labs were ordered today.  Good blood sugar control is important to decrease the likelihood of diabetic complications such as nephropathy, neuropathy, limb loss, blindness, coronary artery disease, and death. Intensive lifestyle modification including diet, exercise and weight loss are  the first line of treatment for diabetes.   - Semaglutide,0.25 or 0.'5MG'$ /DOS, (OZEMPIC, 0.25 OR 0.5 MG/DOSE,) 2 MG/1.5ML SOPN; Inject 0.5 mg into the skin once a week.  Dispense: 1.5 mL; Refill: 0 - Comprehensive metabolic panel - Hemoglobin A1c - Insulin, random  2. Vitamin D deficiency Low Vitamin D level contributes to fatigue and are associated with obesity, breast, and colon cancer. We will refill prescription Vitamin D 50,000 IU every week for 1 month with no refills and Robin Vazquez will follow-up for routine testing of Vitamin D, at least 2-3 times per year to avoid over-replacement. Labs were ordered today.   - Comprehensive metabolic panel - VITAMIN D 25 Hydroxy (Vit-D Deficiency, Fractures)  3. Mixed diabetic hyperlipidemia associated with type 2 diabetes mellitus ([place]) Cardiovascular risk and specific lipid/LDL goals reviewed.  We discussed several lifestyle modifications today and Robin Vazquez will continue to work on diet, exercise and weight loss efforts. Labs ordered today. Orders and follow up as documented in patient record.   Counseling Intensive lifestyle modifications are the first line treatment for this issue. Dietary changes: Increase soluble fiber. Decrease simple carbohydrates. Exercise changes: Moderate to vigorous-intensity aerobic activity 150 minutes per week if tolerated. Lipid-lowering medications: see documented in medical record.  - Lipid Panel With LDL/HDL Ratio  4. Obesity with current BMI of 43.0 Robin Vazquez is currently in the action stage of change. As such, her goal is to continue with weight loss efforts. She has agreed to practicing portion control and making smarter food choices, such as increasing vegetables and decreasing simple carbohydrates.   Labs were ordered today.  Exercise goals:  As is.  Behavioral modification strategies: increasing lean protein intake, increasing vegetables, increasing water intake, no skipping meals, and planning for  success.  Robin Vazquez has agreed to follow-up with our clinic in 3 weeks. She was informed of the importance of frequent follow-up visits to maximize her success with intensive lifestyle modifications for her multiple health conditions.   Objective:   Blood pressure 127/90, pulse 97, temperature 98.4 F (36.9 C), height '4\' 7"'$  (1.397 m), weight 185 lb (83.9 kg), SpO2 94 %. Body mass index is 43 kg/m.  General: Cooperative, alert, well developed, in no acute distress. HEENT: Conjunctivae and lids unremarkable. Cardiovascular: Regular rhythm.  Lungs: Normal work of breathing. Neurologic: No focal deficits.   Lab Results  Component Value Date   CREATININE 0.34 (L) 06/08/2021   BUN 9 06/08/2021   NA 134 (L) 06/08/2021   K 3.6 06/08/2021   CL 103 06/08/2021   CO2 19 (L) 06/08/2021   Lab Results  Component Value Date   ALT 22 06/08/2021   AST 18 06/08/2021   ALKPHOS 55 06/08/2021   BILITOT 0.9 06/08/2021   Lab Results  Component Value Date   HGBA1C 9.8 (H) 06/08/2021   HGBA1C 8.1 (H) 03/13/2021   HGBA1C 8.8 (H) 11/17/2020   HGBA1C 8.1 (H) 08/04/2020   HGBA1C 7.3 (H) 04/04/2020   No results found for: INSULIN Lab Results  Component Value Date   TSH 1.131 06/08/2021   Lab Results  Component Value Date   CHOL 122 06/08/2021   HDL 37 (L) 06/08/2021   LDLCALC 76 06/08/2021   TRIG 46 06/08/2021   CHOLHDL 3.3 06/08/2021   Lab Results  Component Value Date   VD25OH 27.32 (L) 06/08/2021   VD25OH 38.30 04/04/2020   VD25OH 30.84 11/02/2019   Lab Results  Component Value Date   WBC 5.6 06/08/2021   HGB 12.7 06/08/2021   HCT 40.4 06/08/2021   MCV 79.7 (L) 06/08/2021   PLT 387 06/08/2021   No results found for: IRON, TIBC, FERRITIN  Attestation Statements:   Reviewed by clinician on day of visit: allergies, medications, problem list, medical history, surgical history, family history, social history, and previous encounter notes.  I, Lizbeth Bark, RMA, am acting as  Location manager for Everardo Pacific, FNP.  I have reviewed the above documentation for accuracy and completeness, and I agree with the above. Everardo Pacific, FNP

## 2021-11-02 ENCOUNTER — Encounter (INDEPENDENT_AMBULATORY_CARE_PROVIDER_SITE_OTHER): Payer: Self-pay | Admitting: Nurse Practitioner

## 2021-11-02 ENCOUNTER — Ambulatory Visit (INDEPENDENT_AMBULATORY_CARE_PROVIDER_SITE_OTHER): Payer: Medicaid Other | Admitting: Nurse Practitioner

## 2021-11-02 ENCOUNTER — Other Ambulatory Visit (INDEPENDENT_AMBULATORY_CARE_PROVIDER_SITE_OTHER): Payer: Self-pay

## 2021-11-02 VITALS — BP 125/90 | HR 100 | Temp 98.0°F | Ht <= 58 in | Wt 184.0 lb

## 2021-11-02 DIAGNOSIS — Z6841 Body Mass Index (BMI) 40.0 and over, adult: Secondary | ICD-10-CM | POA: Diagnosis not present

## 2021-11-02 DIAGNOSIS — E1165 Type 2 diabetes mellitus with hyperglycemia: Secondary | ICD-10-CM

## 2021-11-02 DIAGNOSIS — Z7985 Long-term (current) use of injectable non-insulin antidiabetic drugs: Secondary | ICD-10-CM

## 2021-11-02 DIAGNOSIS — E669 Obesity, unspecified: Secondary | ICD-10-CM

## 2021-11-02 DIAGNOSIS — E559 Vitamin D deficiency, unspecified: Secondary | ICD-10-CM

## 2021-11-02 MED ORDER — OZEMPIC (0.25 OR 0.5 MG/DOSE) 2 MG/1.5ML ~~LOC~~ SOPN: 0.2500 mg | PEN_INJECTOR | SUBCUTANEOUS | 0 refills | Status: DC

## 2021-11-02 MED ORDER — VITAMIN D (ERGOCALCIFEROL) 1.25 MG (50000 UNIT) PO CAPS: 50000.0000 [IU] | ORAL_CAPSULE | ORAL | 0 refills | Status: DC

## 2021-11-07 ENCOUNTER — Other Ambulatory Visit
Admission: RE | Admit: 2021-11-07 | Discharge: 2021-11-07 | Disposition: A | Discharge status: Home / Self Care | Payer: Medicaid Other | Attending: Nurse Practitioner | Admitting: Nurse Practitioner

## 2021-11-07 NOTE — Progress Notes (Signed)
Chief Complaint:   OBESITY Robin Vazquez is here to discuss her progress with her obesity treatment plan along with follow-up of her obesity related diagnoses. Robin Vazquez is on practicing portion control and making smarter food choices, such as increasing vegetables and decreasing simple carbohydrates and states she is following her eating plan approximately 80% of the time. Robin Vazquez states she is walking 10-15 minutes 1-2 times per week.  Today's visit was #: 8 Starting weight: 186 lbs Starting date: 05/31/2021 Today's weight: 184 lbs Today's date: 11/02/2021 Total lbs lost to date: 2 lbs Total lbs lost since last in-office visit: 1  Interim History: Robin Vazquez is eating 3 meals and 1 snack per day. She started Ozempic 3 weeks ago for diabetes type 2. Notes since starting it has helped to decrease her appetite and cravings. She has tried Phentermine in the past for medical weight loss.  Subjective:   1. Poorly controlled type 2 diabetes mellitus with complication (HCC) Robin Vazquez is taking Ozempic 0.25 mg (has taken 3 doses). Notes nausea this week. Fasting blood sugars=119-130. Denies hypoglycemia. She is also taking Glipizide 10 mg. Denies any side effects. Currently on statin. Her last eye exam 3/23.   2. Vitamin D deficiency Robin Vazquez is currently taking prescription Vit D 50,000 IU once a week. Her last dose was 2 weeks ago. Denies any nausea, vomiting or muscle weakness.  Assessment/Plan:   1. Poorly controlled type 2 diabetes mellitus with complication (HCC) Robin Vazquez will continue taking Ozempic 0.25 mg. She will continue to monitor blood pressure at home. Labs ordered.  We will refill ozempic 0.25 mg SubQ weekly for 1 month with 0 refills.  -Refill Semaglutide,0.25 or 0.5MG /DOS, (OZEMPIC, 0.25 OR 0.5 MG/DOSE,) 2 MG/1.5ML SOPN; Inject 0.25 mg into the skin once a week.  Dispense: 1.5 mL; Refill: 0  2. Vitamin D deficiency We will refill Vit D 50,000 IU once a week for 1 month with 0  refills. Discussed side effects. Labs ordered, to have done at [facility]  Refill Vitamin D, Ergocalciferol, (DRISDOL) 1.25 MG (50000 UNIT) CAPS capsule; Take 1 capsule (50,000 Units total) by mouth every 7 (seven) days.  Dispense: 4 capsule; Refill: 0  3. Obesity with current BMI of 42.77 Robin Vazquez is currently in the action stage of change. As such, her goal is to continue with weight loss efforts. She has agreed to practicing portion control and making smarter food choices, such as increasing vegetables and decreasing simple carbohydrates.   Exercise goals: As is.  Behavioral modification strategies: increasing lean protein intake, increasing water intake, and planning for success.  Farm has agreed to follow-up with our clinic in 3 weeks. She was informed of the importance of frequent follow-up visits to maximize her success with intensive lifestyle modifications for her multiple health conditions.   Objective:   Blood pressure 125/90, pulse 100, temperature 98 F (36.7 C), height 4\' 7"  (1.397 m), weight 184 lb (83.5 kg), SpO2 96 %. Body mass index is 42.77 kg/m.  General: Cooperative, alert, well developed, in no acute distress. HEENT: Conjunctivae and lids unremarkable. Cardiovascular: Regular rhythm.  Lungs: Normal work of breathing. Neurologic: No focal deficits.   Lab Results  Component Value Date   CREATININE 0.34 (L) 06/08/2021   BUN 9 06/08/2021   NA 134 (L) 06/08/2021   K 3.6 06/08/2021   CL 103 06/08/2021   CO2 19 (L) 06/08/2021   Lab Results  Component Value Date   ALT 22 06/08/2021   AST 18 06/08/2021  ALKPHOS 55 06/08/2021   BILITOT 0.9 06/08/2021   Lab Results  Component Value Date   HGBA1C 9.8 (H) 06/08/2021   HGBA1C 8.1 (H) 03/13/2021   HGBA1C 8.8 (H) 11/17/2020   HGBA1C 8.1 (H) 08/04/2020   HGBA1C 7.3 (H) 04/04/2020   No results found for: "INSULIN" Lab Results  Component Value Date   TSH 1.131 06/08/2021   Lab Results   Component Value Date   CHOL 122 06/08/2021   HDL 37 (L) 06/08/2021   LDLCALC 76 06/08/2021   TRIG 46 06/08/2021   CHOLHDL 3.3 06/08/2021   Lab Results  Component Value Date   VD25OH 27.32 (L) 06/08/2021   VD25OH 38.30 04/04/2020   VD25OH 30.84 11/02/2019   Lab Results  Component Value Date   WBC 5.6 06/08/2021   HGB 12.7 06/08/2021   HCT 40.4 06/08/2021   MCV 79.7 (L) 06/08/2021   PLT 387 06/08/2021   No results found for: "IRON", "TIBC", "FERRITIN"  Attestation Statements:   Reviewed by clinician on day of visit: allergies, medications, problem list, medical history, surgical history, family history, social history, and previous encounter notes.  I, Brendell Tyus, RMA, am acting as transcriptionist for Irene Limbo, FNP..  I have reviewed the above documentation for accuracy and completeness, and I agree with the above. Irene Limbo, FNP

## 2021-11-30 ENCOUNTER — Ambulatory Visit (INDEPENDENT_AMBULATORY_CARE_PROVIDER_SITE_OTHER): Payer: Medicaid Other | Admitting: Family Medicine

## 2021-11-30 ENCOUNTER — Encounter (INDEPENDENT_AMBULATORY_CARE_PROVIDER_SITE_OTHER): Payer: Self-pay | Admitting: Family Medicine

## 2021-11-30 VITALS — BP 129/90 | HR 93 | Temp 98.2°F | Ht <= 58 in | Wt 186.0 lb

## 2021-11-30 DIAGNOSIS — Z7985 Long-term (current) use of injectable non-insulin antidiabetic drugs: Secondary | ICD-10-CM

## 2021-11-30 DIAGNOSIS — E559 Vitamin D deficiency, unspecified: Secondary | ICD-10-CM

## 2021-11-30 DIAGNOSIS — E1165 Type 2 diabetes mellitus with hyperglycemia: Secondary | ICD-10-CM

## 2021-11-30 DIAGNOSIS — Z6841 Body Mass Index (BMI) 40.0 and over, adult: Secondary | ICD-10-CM

## 2021-11-30 DIAGNOSIS — E669 Obesity, unspecified: Secondary | ICD-10-CM

## 2021-11-30 MED ORDER — VITAMIN D (ERGOCALCIFEROL) 1.25 MG (50000 UNIT) PO CAPS: 50000.0000 [IU] | ORAL_CAPSULE | ORAL | 0 refills | Status: DC

## 2021-11-30 MED ORDER — OZEMPIC (0.25 OR 0.5 MG/DOSE) 2 MG/1.5ML ~~LOC~~ SOPN: 0.5000 mg | PEN_INJECTOR | SUBCUTANEOUS | 0 refills | Status: DC

## 2021-12-02 NOTE — Progress Notes (Unsigned)
Chief Complaint:   OBESITY Robin Vazquez is here to discuss her progress with her obesity treatment plan along with follow-up of her obesity related diagnoses. Robin Vazquez is on practicing portion control and making smarter food choices, such as increasing vegetables and decreasing simple carbohydrates and states she is following her eating plan approximately 50% of the time. Robin Vazquez states she is walking 15 minutes 4 times per week.  Today's visit was #: 8 Starting weight: 186 lbs Starting date: 05/31/2021 Today's weight: 184 lbs Today's date: 11/30/2021 Total lbs lost to date: 2 lbs Total lbs lost since last in-office visit: +2  Interim History: Robin Vazquez seen Robin Vazquez last office visit.  Started Ozempic on 10/12/2021.  Tolerates it well, denies any side effects.  Difficult to follow meal plan.  Subjective:   1. 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 She continues to work on diet and exercise to decrease her risk of diabetes. She denies nausea or hypoglycemia.  She was started on Ozempic 05/18/22023 by Robin Vazquez and then refilled it at the 0.25 mg dose on 11/02/21.  Ozempic 0.25 was not effective for her.  She is complaining of hunger and cravings on lower dose.   2. Vitamin D deficiency She is currently taking prescription vitamin D 50,000 IU each week. She denies nausea, vomiting or muscle weakness.  Assessment/Plan:  No orders of the defined types were placed in this encounter.   Medications Discontinued During This Encounter  Medication Reason   Vitamin D, Ergocalciferol, (DRISDOL) 1.25 MG (50000 UNIT) CAPS capsule Reorder   Semaglutide,0.25 or 0.'5MG'$ /DOS, (OZEMPIC, 0.25 OR 0.5 MG/DOSE,) 2 MG/1.5ML SOPN      Meds ordered this encounter  Medications   Vitamin D, Ergocalciferol, (DRISDOL) 1.25 MG (50000 UNIT) CAPS  capsule    Sig: Take 1 capsule (50,000 Units total) by mouth every 7 (seven) days.    Dispense:  4 capsule    Refill:  0    30 d supply;   OV for RF   Do not send RF request   Semaglutide,0.25 or 0.'5MG'$ /DOS, (OZEMPIC, 0.25 OR 0.5 MG/DOSE,) 2 MG/1.5ML SOPN    Sig: Inject 0.5 mg into the skin once a week.    Dispense:  1.5 mL    Refill:  0    ( Pt states she does not need RF at thit time, but will inc dose to 0.'5mg'$  )     1. Poorly controlled type 2 diabetes mellitus with complication (HCC) Continue Ozempic 0.5 mg weekly every Thursday.  Refill - Semaglutide,0.25 or 0.'5MG'$ /DOS, (OZEMPIC, 0.25 OR 0.5 MG/DOSE,) 2 MG/1.5ML SOPN; Inject 0.5 mg into the skin once a week.  Dispense: 1.5 mL; Refill: 0  2. Vitamin D deficiency Low Vitamin D level contributes to fatigue and are associated with obesity, breast, and colon cancer. She agrees to continue to take prescription Vitamin D '@50'$ ,000 IU every week and will follow-up for routine testing of Vitamin D, at least 2-3 times per year to avoid over-replacement.  Refill- Vitamin D, Ergocalciferol, (DRISDOL) 1.25 MG (50000 UNIT) CAPS capsule; Take 1 capsule (50,000 Units total) by mouth every 7 (seven) days.  Dispense: 4 capsule; Refill: 0  3. Obesity with current BMI of 43.2 Robin Vazquez will obtain labs at [facility] in near future and will be reviewed before next office visit.   Robin Vazquez is currently in the action  stage of change. As such, her goal is to continue with weight loss efforts. She has agreed to practicing portion control and making smarter food choices, such as increasing vegetables and decreasing simple carbohydrates.   Exercise goals:  As is.   Behavioral modification strategies: increasing lean protein intake, decreasing simple carbohydrates, and planning for success.  Robin Vazquez has agreed to follow-up with our clinic in 3 weeks. She was informed of the importance of frequent follow-up visits to maximize her success with intensive  lifestyle modifications for her multiple health conditions.   Objective:   Blood pressure 129/90, pulse 93, temperature 98.2 F (36.8 C), height '4\' 7"'$  (1.397 m), weight 186 lb (84.4 kg), SpO2 98 %. Body mass index is 43.23 kg/m.  General: Cooperative, alert, well developed, in no acute distress. HEENT: Conjunctivae and lids unremarkable. Cardiovascular: Regular rhythm.  Lungs: Normal work of breathing. Neurologic: No focal deficits.   Lab Results  Component Value Date   CREATININE 0.34 (L) 06/08/2021   BUN 9 06/08/2021   NA 134 (L) 06/08/2021   K 3.6 06/08/2021   CL 103 06/08/2021   CO2 19 (L) 06/08/2021   Lab Results  Component Value Date   ALT 22 06/08/2021   AST 18 06/08/2021   ALKPHOS 55 06/08/2021   BILITOT 0.9 06/08/2021   Lab Results  Component Value Date   HGBA1C 9.8 (H) 06/08/2021   HGBA1C 8.1 (H) 03/13/2021   HGBA1C 8.8 (H) 11/17/2020   HGBA1C 8.1 (H) 08/04/2020   HGBA1C 7.3 (H) 04/04/2020   No results found for: "INSULIN" Lab Results  Component Value Date   TSH 1.131 06/08/2021   Lab Results  Component Value Date   CHOL 122 06/08/2021   HDL 37 (L) 06/08/2021   LDLCALC 76 06/08/2021   TRIG 46 06/08/2021   CHOLHDL 3.3 06/08/2021   Lab Results  Component Value Date   VD25OH 27.32 (L) 06/08/2021   VD25OH 38.30 04/04/2020   VD25OH 30.84 11/02/2019   Lab Results  Component Value Date   WBC 5.6 06/08/2021   HGB 12.7 06/08/2021   HCT 40.4 06/08/2021   MCV 79.7 (L) 06/08/2021   PLT 387 06/08/2021   No results found for: "IRON", "TIBC", "FERRITIN"  Attestation Statements:   Reviewed by clinician on day of visit: allergies, medications, problem list, medical history, surgical history, family history, social history, and previous encounter notes.  I, Robin Vazquez, RMA, am acting as Location manager for [employer], DO.  I have reviewed the above documentation for accuracy and completeness, and I agree with the above. Robin Vazquez,  D.O.  The [place] was signed into law in 2016 which includes the topic of electronic health records.  This provides immediate access to information in MyChart.  This includes consultation notes, operative notes, office notes, lab results and pathology reports.  If you have any questions about what you read please let us know at your next visit so we can discuss your concerns and take corrective action if need be.  We are right here with you.

## 2021-12-05 ENCOUNTER — Other Ambulatory Visit
Admission: RE | Admit: 2021-12-05 | Discharge: 2021-12-05 | Disposition: A | Discharge status: Home / Self Care | Payer: Medicaid Other | Attending: Nurse Practitioner | Admitting: Nurse Practitioner

## 2021-12-05 DIAGNOSIS — E1169 Type 2 diabetes mellitus with other specified complication: Secondary | ICD-10-CM | POA: Insufficient documentation

## 2021-12-05 DIAGNOSIS — E782 Mixed hyperlipidemia: Secondary | ICD-10-CM | POA: Insufficient documentation

## 2021-12-05 DIAGNOSIS — E559 Vitamin D deficiency, unspecified: Secondary | ICD-10-CM | POA: Insufficient documentation

## 2021-12-05 DIAGNOSIS — E1165 Type 2 diabetes mellitus with hyperglycemia: Secondary | ICD-10-CM | POA: Diagnosis present

## 2021-12-05 DIAGNOSIS — Z6841 Body Mass Index (BMI) 40.0 and over, adult: Secondary | ICD-10-CM | POA: Diagnosis not present

## 2021-12-05 LAB — COMPREHENSIVE METABOLIC PANEL
ALT: 17 U/L (ref 0–44)
AST: 17 U/L (ref 15–41)
Albumin: 4.1 g/dL (ref 3.5–5.0)
Alkaline Phosphatase: 70 U/L (ref 38–126)
Anion gap: 10 (ref 5–15)
BUN: 10 mg/dL (ref 6–20)
CO2: 22 mmol/L (ref 22–32)
Calcium: 9 mg/dL (ref 8.9–10.3)
Chloride: 104 mmol/L (ref 98–111)
Creatinine, Ser: 0.4 mg/dL — ABNORMAL LOW (ref 0.44–1.00)
GFR, Estimated: >60 mL/min (ref >60)
Glucose, Bld: 143 mg/dL — ABNORMAL HIGH (ref 70–99)
Potassium: 3.3 mmol/L — ABNORMAL LOW (ref 3.5–5.1)
Sodium: 136 mmol/L (ref 135–145)
Total Bilirubin: 0.6 mg/dL (ref 0.3–1.2)
Total Protein: 8.1 g/dL (ref 6.5–8.1)

## 2021-12-05 LAB — LIPID PANEL
Cholesterol: 174 mg/dL (ref 0–200)
HDL: 40 mg/dL — ABNORMAL LOW (ref >40)
LDL Cholesterol: 123 mg/dL — ABNORMAL HIGH (ref 0–99)
Total CHOL/HDL Ratio: 4.4 RATIO
Triglycerides: 56 mg/dL (ref <150)
VLDL: 11 mg/dL (ref 0–40)

## 2021-12-05 LAB — VITAMIN D 25 HYDROXY (VIT D DEFICIENCY, FRACTURES): Vit D, 25-Hydroxy: 53.97 ng/mL (ref 30–100)

## 2021-12-05 LAB — HEMOGLOBIN A1C
Hgb A1c MFr Bld: 8.3 % — ABNORMAL HIGH (ref 4.8–5.6)
Mean Plasma Glucose: 191.51 mg/dL

## 2021-12-06 LAB — INSULIN, RANDOM: Insulin: 9.1 u[IU]/mL (ref 2.6–24.9)

## 2021-12-10 ENCOUNTER — Other Ambulatory Visit (INDEPENDENT_AMBULATORY_CARE_PROVIDER_SITE_OTHER): Payer: Self-pay | Admitting: Nurse Practitioner

## 2021-12-10 DIAGNOSIS — E1165 Type 2 diabetes mellitus with hyperglycemia: Secondary | ICD-10-CM

## 2021-12-12 ENCOUNTER — Other Ambulatory Visit (INDEPENDENT_AMBULATORY_CARE_PROVIDER_SITE_OTHER): Payer: Self-pay | Admitting: Nurse Practitioner

## 2021-12-12 DIAGNOSIS — E1165 Type 2 diabetes mellitus with hyperglycemia: Secondary | ICD-10-CM

## 2021-12-13 ENCOUNTER — Other Ambulatory Visit (INDEPENDENT_AMBULATORY_CARE_PROVIDER_SITE_OTHER): Payer: Self-pay | Admitting: Family Medicine

## 2021-12-13 DIAGNOSIS — E1165 Type 2 diabetes mellitus with hyperglycemia: Secondary | ICD-10-CM

## 2021-12-13 MED ORDER — OZEMPIC (0.25 OR 0.5 MG/DOSE) 2 MG/1.5ML ~~LOC~~ SOPN: 0.5000 mg | PEN_INJECTOR | SUBCUTANEOUS | 0 refills | Status: DC

## 2021-12-13 NOTE — Telephone Encounter (Signed)
Robin Vazquez, please let pt know we will make exception this one time. Thanks

## 2021-12-14 ENCOUNTER — Other Ambulatory Visit: Payer: Self-pay

## 2021-12-14 MED ORDER — OZEMPIC (0.25 OR 0.5 MG/DOSE) 2 MG/3ML ~~LOC~~ SOPN
0.5000 mg | PEN_INJECTOR | SUBCUTANEOUS | 0 refills | Status: DC
  Filled 2021-12-14: qty 3, 28d supply, fill #0

## 2021-12-18 ENCOUNTER — Other Ambulatory Visit: Payer: Self-pay

## 2021-12-21 ENCOUNTER — Encounter (INDEPENDENT_AMBULATORY_CARE_PROVIDER_SITE_OTHER): Payer: Self-pay | Admitting: Nurse Practitioner

## 2021-12-21 ENCOUNTER — Ambulatory Visit (INDEPENDENT_AMBULATORY_CARE_PROVIDER_SITE_OTHER): Payer: Medicaid Other | Admitting: Nurse Practitioner

## 2021-12-21 VITALS — BP 128/86 | HR 87 | Temp 98.3°F | Ht <= 58 in | Wt 186.0 lb

## 2021-12-21 DIAGNOSIS — Z7984 Long term (current) use of oral hypoglycemic drugs: Secondary | ICD-10-CM

## 2021-12-21 DIAGNOSIS — E1165 Type 2 diabetes mellitus with hyperglycemia: Secondary | ICD-10-CM | POA: Diagnosis not present

## 2021-12-21 DIAGNOSIS — Z7985 Long-term (current) use of injectable non-insulin antidiabetic drugs: Secondary | ICD-10-CM

## 2021-12-21 DIAGNOSIS — Z6841 Body Mass Index (BMI) 40.0 and over, adult: Secondary | ICD-10-CM

## 2021-12-21 DIAGNOSIS — E669 Obesity, unspecified: Secondary | ICD-10-CM | POA: Diagnosis not present

## 2021-12-21 MED ORDER — DAPAGLIFLOZIN PROPANEDIOL 10 MG PO TABS: 10.0000 mg | ORAL_TABLET | Freq: Every day | ORAL | 0 refills | Status: DC

## 2021-12-21 MED ORDER — OZEMPIC (0.25 OR 0.5 MG/DOSE) 2 MG/3ML ~~LOC~~ SOPN: 0.5000 mg | PEN_INJECTOR | SUBCUTANEOUS | 0 refills | Status: DC

## 2021-12-26 NOTE — Progress Notes (Signed)
Chief Complaint:   OBESITY Robin Vazquez is here to discuss her progress with her obesity treatment plan along with follow-up of her obesity related diagnoses. Robin Vazquez is on practicing portion control and making smarter food choices, such as increasing vegetables and decreasing simple carbohydrates and states she is following her eating plan approximately 25-30% of the time. Robin Vazquez states she is exercising 0 minutes 0 times per week.  Today's visit was #: 9 Starting weight: 186 lbs Starting date: 05/31/2021 Today's weight: 186 lbs Today's date: 12/21/2021 Total lbs lost to date: 0 lbs Total lbs lost since last in-office visit: 0  Interim History: Robin Vazquez has maintained her weight since her last visit. She has been seen and treated for an abscess. She has not been able to exercise due to pain. She finished antibiotic this week. Abscess is starting to feel better. She finds it easier to order out/fast food. Drinking water, Snapple and diet sodas.   Subjective:   1. Poorly controlled type 2 diabetes mellitus with complication (HCC) Robin Vazquez is taking Ozempic 0.5 mg. Denies side effects. Dose increased two weeks ago. She is not checking blood sugars at home. She also is taking Glipizide 10 mg and Farxiga 10 mg. On statin. Her last A1c was 8.3. Last eye exam: 3/23.  Assessment/Plan:   1. Poorly controlled type 2 diabetes mellitus with complication (HCC) Refill Ozempic 0.5 mg SubQ once weekly AND Farxiga 10 mg by mouth for 1 month with 0 refills. Side effects discussed.   Good blood sugar control is important to decrease the likelihood of diabetic complications such as nephropathy, neuropathy, limb loss, blindness, coronary artery disease, and death. Intensive lifestyle modification including diet, exercise and weight loss are the first line of treatment for diabetes.    Refill Semaglutide,0.25 or 0.'5MG'$ /DOS, (OZEMPIC, 0.25 OR 0.5 MG/DOSE,) 2 MG/3ML SOPN; Inject 0.5 mg into the skin once a  week.  Dispense: 3 mL; Refill: 0   Refill dapagliflozin propanediol (FARXIGA) 10 MG TABS tablet; Take 1 tablet (10 mg total) by mouth daily.  Dispense: 30 tablet; Refill: 0  2. Obesity with current BMI of 43.2 Robin Vazquez is currently in the action stage of change. As such, her goal is to continue with weight loss efforts. She has agreed to practicing portion control and making smarter food choices, such as increasing vegetables and decreasing simple carbohydrates.   Exercise goals: All adults should avoid inactivity. Some physical activity is better than none, and adults who participate in any amount of physical activity gain some health benefits.  Behavioral modification strategies: increasing vegetables, increasing water intake, and planning for success.  [name] has agreed to follow-up with our clinic in 3 weeks. She was informed of the importance of frequent follow-up visits to maximize her success with intensive lifestyle modifications for her multiple health conditions.   Objective:   Blood pressure 128/86, pulse 87, temperature 98.3 F (36.8 C), height '4\' 7"'$  (1.397 m), weight 186 lb (84.4 kg), SpO2 93 %. Body mass index is 43.23 kg/m.  General: Cooperative, alert, well developed, in no acute distress. HEENT: Conjunctivae and lids unremarkable. Cardiovascular: Regular rhythm.  Lungs: Normal work of breathing. Neurologic: No focal deficits.   Lab Results  Component Value Date   CREATININE 0.40 (L) 12/05/2021   BUN 10 12/05/2021   NA 136 12/05/2021   K 3.3 (L) 12/05/2021   CL 104 12/05/2021   CO2 22 12/05/2021   Lab Results  Component Value Date   ALT 17 12/05/2021  AST 17 12/05/2021   ALKPHOS 70 12/05/2021   BILITOT 0.6 12/05/2021   Lab Results  Component Value Date   HGBA1C 8.3 (H) 12/05/2021   HGBA1C 9.8 (H) 06/08/2021   HGBA1C 8.1 (H) 03/13/2021   HGBA1C 8.8 (H) 11/17/2020   HGBA1C 8.1 (H) 08/04/2020   No results found for: "INSULIN" Lab Results  Component  Value Date   TSH 1.131 06/08/2021   Lab Results  Component Value Date   CHOL 174 12/05/2021   HDL 40 (L) 12/05/2021   LDLCALC 123 (H) 12/05/2021   TRIG 56 12/05/2021   CHOLHDL 4.4 12/05/2021   Lab Results  Component Value Date   VD25OH 53.97 12/05/2021   VD25OH 27.32 (L) 06/08/2021   VD25OH 38.30 04/04/2020   Lab Results  Component Value Date   WBC 5.6 06/08/2021   HGB 12.7 06/08/2021   HCT 40.4 06/08/2021   MCV 79.7 (L) 06/08/2021   PLT 387 06/08/2021   No results found for: "IRON", "TIBC", "FERRITIN"  Attestation Statements:   Reviewed by clinician on day of visit: allergies, medications, problem list, medical history, surgical history, family history, social history, and previous encounter notes.  I, Brendell Tyus, RMA, am acting as transcriptionist for Everardo Pacific, FNP.  I have reviewed the above documentation for accuracy and completeness, and I agree with the above. Everardo Pacific, FNP

## 2022-01-03 ENCOUNTER — Encounter (INDEPENDENT_AMBULATORY_CARE_PROVIDER_SITE_OTHER): Payer: Self-pay

## 2022-01-15 ENCOUNTER — Ambulatory Visit (INDEPENDENT_AMBULATORY_CARE_PROVIDER_SITE_OTHER): Payer: Medicaid Other | Admitting: Nurse Practitioner

## 2022-01-15 ENCOUNTER — Encounter (INDEPENDENT_AMBULATORY_CARE_PROVIDER_SITE_OTHER): Payer: Self-pay | Admitting: Nurse Practitioner

## 2022-01-15 VITALS — BP 123/89 | HR 103 | Temp 98.8°F | Ht <= 58 in | Wt 180.0 lb

## 2022-01-15 DIAGNOSIS — Z7985 Long-term (current) use of injectable non-insulin antidiabetic drugs: Secondary | ICD-10-CM

## 2022-01-15 DIAGNOSIS — Z6841 Body Mass Index (BMI) 40.0 and over, adult: Secondary | ICD-10-CM | POA: Diagnosis not present

## 2022-01-15 DIAGNOSIS — E669 Obesity, unspecified: Secondary | ICD-10-CM

## 2022-01-15 DIAGNOSIS — E1165 Type 2 diabetes mellitus with hyperglycemia: Secondary | ICD-10-CM

## 2022-01-15 DIAGNOSIS — E118 Type 2 diabetes mellitus with unspecified complications: Secondary | ICD-10-CM

## 2022-01-23 NOTE — Progress Notes (Unsigned)
Chief Complaint:   OBESITY Robin Vazquez is here to discuss her progress with her obesity treatment plan along with follow-up of her obesity related diagnoses. Robin Vazquez is on practicing portion control and making smarter food choices, such as increasing vegetables and decreasing simple carbohydrates and states she is following her eating plan approximately 80% of the time. Robin Vazquez states she is walking 15 minutes 3 times per week.  Today's visit was #: 10 Starting weight: 186 lbs Starting date: 05/31/2021 Today's weight: 180 lbs Today's date: 01/15/2022 Total lbs lost to date: 6 lbs Total lbs lost since last in-office visit: 6  Interim History: Robin Vazquez has done well with weight loss since her last visit. Doing well with the meal plan. She is eating 2 meals and substituting a protein shake for breakfast. Drinking water, G2, Snapple and rarely diet sodas. Has been walking more. She reports meeting protein goals.  Subjective:   1. Poorly controlled type 2 diabetes mellitus with complication (HCC) Robin Vazquez is currently taking Ozempic 0.5 mg. Denies any side effects. She is also taking Glipizide 10 mg and Farxiga 10 mg. On a statin.ting blood sugars are 97-184 (after birthday). Her last eye exam: 3/23. Last A1c was 8.3.  Assessment/Plan:   1. Poorly controlled type 2 diabetes mellitus with complication (HCC) Continue Ozempic 0.5 mg. Discussed increase to 1 mg but she would like to hold off on increasing dose.  Good blood sugar control is important to decrease the likelihood of diabetic complications such as nephropathy, neuropathy, limb loss, blindness, coronary artery disease, and death. Intensive lifestyle modification including diet, exercise and weight loss are the first line of treatment for diabetes.    2. Obesity with current BMI of 41.84 Robin Vazquez is currently in the action stage of change. As such, her goal is to continue with weight loss efforts. She has agreed to practicing  portion control and making smarter food choices, such as increasing vegetables and decreasing simple carbohydrates.   Exercise goals: As is.  Behavioral modification strategies: increasing lean protein intake, increasing vegetables, and increasing water intake.  Robin Vazquez has agreed to follow-up with our clinic in 4 weeks. She was informed of the importance of frequent follow-up visits to maximize her success with intensive lifestyle modifications for her multiple health conditions.   Objective:   Blood pressure 123/89, pulse (!) 103, temperature 98.8 F (37.1 C), height '4\' 7"'$  (1.397 m), weight 180 lb (81.6 kg), SpO2 100 %. Body mass index is 41.84 kg/m.  General: Cooperative, alert, well developed, in no acute distress. HEENT: Conjunctivae and lids unremarkable. Cardiovascular: Regular rhythm.  Lungs: Normal work of breathing. Neurologic: No focal deficits.   Lab Results  Component Value Date   CREATININE 0.40 (L) 12/05/2021   BUN 10 12/05/2021   NA 136 12/05/2021   K 3.3 (L) 12/05/2021   CL 104 12/05/2021   CO2 22 12/05/2021   Lab Results  Component Value Date   ALT 17 12/05/2021   AST 17 12/05/2021   ALKPHOS 70 12/05/2021   BILITOT 0.6 12/05/2021   Lab Results  Component Value Date   HGBA1C 8.3 (H) 12/05/2021   HGBA1C 9.8 (H) 06/08/2021   HGBA1C 8.1 (H) 03/13/2021   HGBA1C 8.8 (H) 11/17/2020   HGBA1C 8.1 (H) 08/04/2020   No results found for: "INSULIN" Lab Results  Component Value Date   TSH 1.131 06/08/2021   Lab Results  Component Value Date   CHOL 174 12/05/2021   HDL 40 (L) 12/05/2021   [place]  123 (H) 12/05/2021   TRIG 56 12/05/2021   CHOLHDL 4.4 12/05/2021   Lab Results  Component Value Date   VD25OH 53.97 12/05/2021   VD25OH 27.32 (L) 06/08/2021   VD25OH 38.30 04/04/2020   Lab Results  Component Value Date   WBC 5.6 06/08/2021   HGB 12.7 06/08/2021   HCT 40.4 06/08/2021   MCV 79.7 (L) 06/08/2021   PLT 387 06/08/2021   No results found  for: "IRON", "TIBC", "FERRITIN"  Attestation Statements:   Reviewed by clinician on day of visit: allergies, medications, problem list, medical history, surgical history, family history, social history, and previous encounter notes.  Time spent on visit including pre-visit chart review and post-visit care and charting was 30 minutes.   I, Brendell Tyus, RMA, am acting as transcriptionist for Everardo Pacific, FNP.  I have reviewed the above documentation for accuracy and completeness, and I agree with the above. Everardo Pacific, FNP

## 2022-02-08 ENCOUNTER — Other Ambulatory Visit (INDEPENDENT_AMBULATORY_CARE_PROVIDER_SITE_OTHER): Payer: Self-pay | Admitting: Nurse Practitioner

## 2022-02-08 DIAGNOSIS — E559 Vitamin D deficiency, unspecified: Secondary | ICD-10-CM

## 2022-02-13 ENCOUNTER — Encounter (INDEPENDENT_AMBULATORY_CARE_PROVIDER_SITE_OTHER): Payer: Self-pay | Admitting: Nurse Practitioner

## 2022-02-13 ENCOUNTER — Ambulatory Visit (INDEPENDENT_AMBULATORY_CARE_PROVIDER_SITE_OTHER): Payer: Medicaid Other | Admitting: Nurse Practitioner

## 2022-02-13 VITALS — BP 130/93 | HR 106 | Temp 98.2°F | Ht <= 58 in | Wt 180.0 lb

## 2022-02-13 DIAGNOSIS — Z7985 Long-term (current) use of injectable non-insulin antidiabetic drugs: Secondary | ICD-10-CM

## 2022-02-13 DIAGNOSIS — Z6841 Body Mass Index (BMI) 40.0 and over, adult: Secondary | ICD-10-CM

## 2022-02-13 DIAGNOSIS — E66813 Obesity, class 3: Secondary | ICD-10-CM

## 2022-02-13 DIAGNOSIS — E669 Obesity, unspecified: Secondary | ICD-10-CM

## 2022-02-13 DIAGNOSIS — Z7984 Long term (current) use of oral hypoglycemic drugs: Secondary | ICD-10-CM

## 2022-02-13 DIAGNOSIS — E118 Type 2 diabetes mellitus with unspecified complications: Secondary | ICD-10-CM

## 2022-02-13 DIAGNOSIS — E1165 Type 2 diabetes mellitus with hyperglycemia: Secondary | ICD-10-CM

## 2022-02-13 MED ORDER — SEMAGLUTIDE (1 MG/DOSE) 4 MG/3ML ~~LOC~~ SOPN: 1.0000 mg | PEN_INJECTOR | SUBCUTANEOUS | 0 refills | Status: DC

## 2022-02-14 NOTE — Progress Notes (Unsigned)
Chief Complaint:   OBESITY Robin Vazquez is here to discuss her progress with her obesity treatment plan along with follow-up of her obesity related diagnoses. Robin Vazquez is on practicing portion control and making smarter food choices, such as increasing vegetables and decreasing simple carbohydrates and states she is following her eating plan approximately 80% of the time. Robin Vazquez states she is walking 15 minutes 3 times per week.  Today's visit was #: 11 Starting weight: 186 lbs Starting date: 05/31/2021 Today's weight: 180 lb Today's date: 02/13/2022 Total lbs lost to date: 6 lbs Total lbs lost since last in-office visit: 0  Interim History: Robin Vazquez likes following [place] plan. Did not like Cat 2 plan. Breakfast is otameal with honey or 2 eggs with toast or a protein shake. Lunch is skipped. Dinner is protein with veggies. Some polyphagia or cravings. Drinking water, G2, Snapple and rarely a diet soda.   Subjective:   1. Poorly controlled type 2 diabetes mellitus with complication (HCC) Robin Vazquez is taking Ozempic 0.5 mg. Denies any side effects. Fasting blood sugars are 99-126. She is taking Glipizide 10 mg and Farxiga 10 mg. She is on a statin and an ACE. Last eye exam 3/23. Reports polyphagia or cravings.   Assessment/Plan:   1. Poorly controlled type 2 diabetes mellitus with complication (HCC) We will refill/increase Ozempic 1 mg once weekly for 1 month with 0 refills. Side effects discussed. She is seeing PCP in October for follow up and labs.  -Refill/increase Semaglutide, 1 MG/DOSE, 4 MG/3ML SOPN; Inject 1 mg as directed once a week.  Dispense: 3 mL; Refill: 0  2. Obesity with current BMI of 41.84 Robin Vazquez is currently in the action stage of change. As such, her goal is to continue with weight loss efforts. She has agreed to following a lower carbohydrate, vegetable and lean protein rich diet plan.   Exercise goals: As is.   Behavioral modification strategies: increasing lean  protein intake, increasing vegetables, and increasing water intake.  Robin Vazquez has agreed to follow-up with our clinic in 4 weeks. She was informed of the importance of frequent follow-up visits to maximize her success with intensive lifestyle modifications for her multiple health conditions.   Objective:   Blood pressure (!) 130/93, pulse (!) 106, temperature 98.2 F (36.8 C), height '4\' 7"'$  (1.397 m), weight 180 lb (81.6 kg), SpO2 100 %. Body mass index is 41.84 kg/m.  General: Cooperative, alert, well developed, in no acute distress. HEENT: Conjunctivae and lids unremarkable. Cardiovascular: Regular rhythm.  Lungs: Normal work of breathing. Neurologic: No focal deficits.   Lab Results  Component Value Date   CREATININE 0.40 (L) 12/05/2021   BUN 10 12/05/2021   NA 136 12/05/2021   K 3.3 (L) 12/05/2021   CL 104 12/05/2021   CO2 22 12/05/2021   Lab Results  Component Value Date   ALT 17 12/05/2021   AST 17 12/05/2021   ALKPHOS 70 12/05/2021   BILITOT 0.6 12/05/2021   Lab Results  Component Value Date   HGBA1C 8.3 (H) 12/05/2021   HGBA1C 9.8 (H) 06/08/2021   HGBA1C 8.1 (H) 03/13/2021   HGBA1C 8.8 (H) 11/17/2020   HGBA1C 8.1 (H) 08/04/2020   No results found for: "INSULIN" Lab Results  Component Value Date   TSH 1.131 06/08/2021   Lab Results  Component Value Date   CHOL 174 12/05/2021   HDL 40 (L) 12/05/2021   LDLCALC 123 (H) 12/05/2021   TRIG 56 12/05/2021   CHOLHDL 4.4 12/05/2021  Lab Results  Component Value Date   VD25OH 53.97 12/05/2021   VD25OH 27.32 (L) 06/08/2021   VD25OH 38.30 04/04/2020   Lab Results  Component Value Date   WBC 5.6 06/08/2021   HGB 12.7 06/08/2021   HCT 40.4 06/08/2021   MCV 79.7 (L) 06/08/2021   PLT 387 06/08/2021   No results found for: "IRON", "TIBC", "FERRITIN"  Attestation Statements:   Reviewed by clinician on day of visit: allergies, medications, problem list, medical history, surgical history, family history,  social history, and previous encounter notes.  I, Brendell Tyus, RMA, am acting as transcriptionist for Everardo Pacific, FNP.  I have reviewed the above documentation for accuracy and completeness, and I agree with the above. Everardo Pacific, FNP

## 2022-03-13 ENCOUNTER — Ambulatory Visit: Payer: Medicaid Other | Admitting: Nurse Practitioner

## 2022-03-13 ENCOUNTER — Encounter: Payer: Self-pay | Admitting: Nurse Practitioner

## 2022-03-13 VITALS — BP 121/90 | HR 97 | Temp 98.5°F | Ht <= 58 in | Wt 176.0 lb

## 2022-03-13 DIAGNOSIS — E1165 Type 2 diabetes mellitus with hyperglycemia: Secondary | ICD-10-CM

## 2022-03-13 DIAGNOSIS — E66813 Obesity, class 3: Secondary | ICD-10-CM

## 2022-03-13 DIAGNOSIS — E669 Obesity, unspecified: Secondary | ICD-10-CM | POA: Diagnosis not present

## 2022-03-13 DIAGNOSIS — E118 Type 2 diabetes mellitus with unspecified complications: Secondary | ICD-10-CM

## 2022-03-13 DIAGNOSIS — E559 Vitamin D deficiency, unspecified: Secondary | ICD-10-CM | POA: Diagnosis not present

## 2022-03-13 DIAGNOSIS — Z6841 Body Mass Index (BMI) 40.0 and over, adult: Secondary | ICD-10-CM

## 2022-03-13 DIAGNOSIS — Z7985 Long-term (current) use of injectable non-insulin antidiabetic drugs: Secondary | ICD-10-CM

## 2022-03-13 MED ORDER — VITAMIN D (ERGOCALCIFEROL) 1.25 MG (50000 UNIT) PO CAPS: 50000.0000 [IU] | ORAL_CAPSULE | ORAL | 0 refills | Status: DC

## 2022-03-13 MED ORDER — SEMAGLUTIDE (1 MG/DOSE) 4 MG/3ML ~~LOC~~ SOPN: 1.0000 mg | PEN_INJECTOR | SUBCUTANEOUS | 0 refills | Status: DC

## 2022-03-19 NOTE — Progress Notes (Signed)
Chief Complaint:   OBESITY Robin Vazquez is here to discuss her progress with her obesity treatment plan along with follow-up of her obesity related diagnoses. Robin Vazquez is on following a lower carbohydrate, vegetable and lean protein rich diet plan and states she is following her eating plan approximately 45% of the time. Robin Vazquez states she is walking 15 minutes 3 times per week.  Today's visit was #: 12 Starting weight: 186 lbs Starting date: 05/31/2021 Today's weight: 176 lbs Today's date: 03/13/2022 Total lbs lost to date: 10 lbs Total lbs lost since last in-office visit: 4  Interim History: Robin Vazquez has done well with her weight loss since her last visit. Went to [place] for 2 weeks to visit her sister.  She missies fruit on LCP. She is substituting a protein shake for breakfast and for lunch and dinner she is eating a protein and veggies. Her highest weight was 200 lbs, her goal weight is 150 lbs. Has taken Phentermine in the past. Seeing PCP for follow up and labs on Oct 31. Drinking water and diet soda.  Subjective:   1. 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 Denies any side effects. She also is taking Glipizide 10 mg and Farxiga 10 mg. Fasting blood sugars 90-145. Denies polyphagia and cravings. Denies hypoglycemia. Seeing a podiatrist on regular basis.   2. Vitamin D deficiency Robin Vazquez is currently taking prescription Vit D 50,000 IU once a week. Denies any nausea, vomiting or muscle weakness. She has been out of Vit D for the past month.  Assessment/Plan:   1. Poorly controlled type 2 diabetes mellitus with complication (HCC) We will refill Ozempic 1 mg once weekly for 1 month with 0 refills. Side effects discussed.   Good blood sugar control is important to decrease the likelihood of diabetic complications such as nephropathy, neuropathy, limb loss, blindness, coronary artery  disease, and death. Intensive lifestyle modification including diet, exercise and weight loss are the first line of treatment for diabetes.    -Refill Semaglutide, 1 MG/DOSE, 4 MG/3ML SOPN; Inject 1 mg as directed once a week.  Dispense: 3 mL; Refill: 0  2. Vitamin D deficiency We will refill Vit D 50,000 IU once a week for 1 month with 0 refills. Low Vitamin D level contributes to fatigue and are associated with obesity, breast, and colon cancer. She agrees to continue to take prescription Vitamin D '@50'$ ,000 IU every week and will follow-up for routine testing of Vitamin D, at least 2-3 times per year to avoid over-replacement.   -Refill Vitamin D, Ergocalciferol, (DRISDOL) 1.25 MG (50000 UNIT) CAPS capsule; Take 1 capsule (50,000 Units total) by mouth every 7 (seven) days.  Dispense: 4 capsule; Refill: 0  3. Obesity with current BMI of 40.91 Robin Vazquez is currently in the action stage of change. As such, her goal is to continue with weight loss efforts. She has agreed to following a lower carbohydrate, vegetable and lean protein rich diet plan.   Exercise goals: All adults should avoid inactivity. Some physical activity is better than none, and adults who participate in any amount of physical activity gain some health benefits.  Behavioral modification strategies: increasing water intake, no skipping meals, and planning for success.  Robin Vazquez has agreed to follow-up with our clinic in 4 weeks. She was informed of the importance of frequent follow-up visits to maximize her success with intensive lifestyle modifications for her multiple health conditions.   Objective:  Blood pressure (!) 121/90, pulse 97, temperature 98.5 F (36.9 C), temperature source Oral, height '4\' 7"'$  (1.397 m), weight 176 lb (79.8 kg), SpO2 100 %. Body mass index is 40.91 kg/m.  General: Cooperative, alert, well developed, in no acute distress. HEENT: Conjunctivae and lids unremarkable. Cardiovascular: Regular rhythm.   Lungs: Normal work of breathing. Neurologic: No focal deficits.   Lab Results  Component Value Date   CREATININE 0.40 (L) 12/05/2021   BUN 10 12/05/2021   NA 136 12/05/2021   K 3.3 (L) 12/05/2021   CL 104 12/05/2021   CO2 22 12/05/2021   Lab Results  Component Value Date   ALT 17 12/05/2021   AST 17 12/05/2021   ALKPHOS 70 12/05/2021   BILITOT 0.6 12/05/2021   Lab Results  Component Value Date   HGBA1C 8.3 (H) 12/05/2021   HGBA1C 9.8 (H) 06/08/2021   HGBA1C 8.1 (H) 03/13/2021   HGBA1C 8.8 (H) 11/17/2020   HGBA1C 8.1 (H) 08/04/2020   No results found for: "INSULIN" Lab Results  Component Value Date   TSH 1.131 06/08/2021   Lab Results  Component Value Date   CHOL 174 12/05/2021   HDL 40 (L) 12/05/2021   LDLCALC 123 (H) 12/05/2021   TRIG 56 12/05/2021   CHOLHDL 4.4 12/05/2021   Lab Results  Component Value Date   VD25OH 53.97 12/05/2021   VD25OH 27.32 (L) 06/08/2021   VD25OH 38.30 04/04/2020   Lab Results  Component Value Date   WBC 5.6 06/08/2021   HGB 12.7 06/08/2021   HCT 40.4 06/08/2021   MCV 79.7 (L) 06/08/2021   PLT 387 06/08/2021   No results found for: "IRON", "TIBC", "FERRITIN"  Attestation Statements:   Reviewed by clinician on day of visit: allergies, medications, problem list, medical history, surgical history, family history, social history, and previous encounter notes.  I, Brendell Tyus, RMA, am acting as transcriptionist for Everardo Pacific, FNP.  I have reviewed the above documentation for accuracy and completeness, and I agree with the above. Everardo Pacific, FNP

## 2022-03-27 ENCOUNTER — Other Ambulatory Visit
Admission: RE | Admit: 2022-03-27 | Discharge: 2022-03-27 | Disposition: A | Discharge status: Home / Self Care | Payer: Medicaid Other | Source: Ambulatory Visit | Attending: Nurse Practitioner | Admitting: Nurse Practitioner

## 2022-03-27 DIAGNOSIS — R5383 Other fatigue: Secondary | ICD-10-CM | POA: Diagnosis not present

## 2022-03-27 DIAGNOSIS — E1165 Type 2 diabetes mellitus with hyperglycemia: Secondary | ICD-10-CM | POA: Diagnosis present

## 2022-03-27 DIAGNOSIS — E782 Mixed hyperlipidemia: Secondary | ICD-10-CM | POA: Diagnosis not present

## 2022-03-27 LAB — HEMOGLOBIN A1C
Hgb A1c MFr Bld: 7.2 % — ABNORMAL HIGH (ref 4.8–5.6)
Mean Plasma Glucose: 159.94 mg/dL

## 2022-03-27 LAB — COMPREHENSIVE METABOLIC PANEL
ALT: 17 U/L (ref 0–44)
AST: 17 U/L (ref 15–41)
Albumin: 3.7 g/dL (ref 3.5–5.0)
Alkaline Phosphatase: 59 U/L (ref 38–126)
Anion gap: 6 (ref 5–15)
BUN: 10 mg/dL (ref 6–20)
CO2: 21 mmol/L — ABNORMAL LOW (ref 22–32)
Calcium: 8.7 mg/dL — ABNORMAL LOW (ref 8.9–10.3)
Chloride: 108 mmol/L (ref 98–111)
Creatinine, Ser: 0.45 mg/dL (ref 0.44–1.00)
GFR, Estimated: >60 mL/min (ref >60)
Glucose, Bld: 158 mg/dL — ABNORMAL HIGH (ref 70–99)
Potassium: 3.6 mmol/L (ref 3.5–5.1)
Sodium: 135 mmol/L (ref 135–145)
Total Bilirubin: 0.4 mg/dL (ref 0.3–1.2)
Total Protein: 7.5 g/dL (ref 6.5–8.1)

## 2022-03-27 LAB — TSH: TSH: 1.127 u[IU]/mL (ref 0.350–4.500)

## 2022-03-27 LAB — LIPID PANEL
Cholesterol: 158 mg/dL (ref 0–200)
HDL: 40 mg/dL — ABNORMAL LOW (ref >40)
LDL Cholesterol: 107 mg/dL — ABNORMAL HIGH (ref 0–99)
Total CHOL/HDL Ratio: 4 RATIO
Triglycerides: 57 mg/dL (ref <150)
VLDL: 11 mg/dL (ref 0–40)

## 2022-03-27 LAB — CBC
HCT: 33.6 % — ABNORMAL LOW (ref 36.0–46.0)
Hemoglobin: 9.7 g/dL — ABNORMAL LOW (ref 12.0–15.0)
MCH: 18.9 pg — ABNORMAL LOW (ref 26.0–34.0)
MCHC: 28.9 g/dL — ABNORMAL LOW (ref 30.0–36.0)
MCV: 65.6 fL — ABNORMAL LOW (ref 80.0–100.0)
Platelets: 629 10*3/uL — ABNORMAL HIGH (ref 150–400)
RBC: 5.12 MIL/uL — ABNORMAL HIGH (ref 3.87–5.11)
RDW: 17.6 % — ABNORMAL HIGH (ref 11.5–15.5)
WBC: 8 10*3/uL (ref 4.0–10.5)
nRBC: 0 % (ref 0.0–0.2)

## 2022-04-10 ENCOUNTER — Encounter: Payer: Self-pay | Admitting: Nurse Practitioner

## 2022-04-10 ENCOUNTER — Ambulatory Visit: Payer: Medicaid Other | Admitting: Nurse Practitioner

## 2022-04-10 ENCOUNTER — Other Ambulatory Visit: Payer: Self-pay | Admitting: Nurse Practitioner

## 2022-04-10 VITALS — BP 132/89 | HR 86 | Temp 98.3°F | Ht <= 58 in | Wt 179.0 lb

## 2022-04-10 DIAGNOSIS — Z7984 Long term (current) use of oral hypoglycemic drugs: Secondary | ICD-10-CM

## 2022-04-10 DIAGNOSIS — E118 Type 2 diabetes mellitus with unspecified complications: Secondary | ICD-10-CM

## 2022-04-10 DIAGNOSIS — E1165 Type 2 diabetes mellitus with hyperglycemia: Secondary | ICD-10-CM

## 2022-04-10 DIAGNOSIS — E559 Vitamin D deficiency, unspecified: Secondary | ICD-10-CM

## 2022-04-10 DIAGNOSIS — D509 Iron deficiency anemia, unspecified: Secondary | ICD-10-CM

## 2022-04-10 DIAGNOSIS — Z6841 Body Mass Index (BMI) 40.0 and over, adult: Secondary | ICD-10-CM

## 2022-04-10 MED ORDER — SEMAGLUTIDE (1 MG/DOSE) 4 MG/3ML ~~LOC~~ SOPN: 1.0000 mg | PEN_INJECTOR | SUBCUTANEOUS | 0 refills | Status: DC

## 2022-04-10 MED ORDER — VITAMIN D (ERGOCALCIFEROL) 1.25 MG (50000 UNIT) PO CAPS: 50000.0000 [IU] | ORAL_CAPSULE | ORAL | 0 refills | Status: DC

## 2022-04-12 NOTE — Progress Notes (Signed)
Chief Complaint:   OBESITY Robin Vazquez is here to discuss her progress with her obesity treatment plan along with follow-up of her obesity related diagnoses. Robin Vazquez is on following a lower carbohydrate, vegetable and lean protein rich diet plan and states she is following her eating plan approximately 0% of the time. Robin Vazquez states she is walking 15 minutes 3 times per week.  Today's visit was #: 13 Starting weight: 186 lbs Starting date: 05/31/2021 Today's weight: 179 lbs Today's date: 04/10/2022 Total lbs lost to date: 7 lbs Total lbs lost since last in-office visit: 0  Interim History: Robin Vazquez has gotten off track since [employer]. Has been eating more Halloween candy. Not skipping meals. She has been following LCP but not consistently. Denies polyphagia or cravings. Drinking water 64-72 oz and diet lemonade.  Subjective:   1. Poorly controlled type 2 diabetes mellitus with complication (HCC) Robin Vazquez taking Glipizide 10 mg and Ozempic 1 mg. Denies any side effects.Blood sugar: 80-148. Notes seeing Glipizide sometimes in her bowel movements. Denies hypoglycemia. Last A1c was 7.2.  2. Vitamin D deficiency [name] is currently taking prescription Vit D 50,000 IU once a week. Denies any nausea, vomiting or muscle weakness.  3. Iron deficiency anemia, unspecified iron deficiency anemia type Robin Vazquez's Hgb 9.7 on 03/27/22. Seeing PCP tomorrow to discuss. Restarted taking iron on Nov 1st. PICA noted. Occassional heavy menses. Denies blood in stools.  Assessment/Plan:   1. Poorly controlled type 2 diabetes mellitus with complication (HCC) We will refill Ozempic 1 mg once weekly for 1 month with 0 refills. Side effects discussed.   Good blood sugar control is important to decrease the likelihood of diabetic complications such as nephropathy, neuropathy, limb loss, blindness, coronary artery disease, and death. Intensive lifestyle modification including diet, exercise and weight loss  are the first line of treatment for diabetes.    -Refill Semaglutide, 1 MG/DOSE, 4 MG/3ML SOPN; Inject 1 mg as directed once a week.  Dispense: 3 mL; Refill: 0  2. Vitamin D deficiency We will refill Vit D 50,000 IU once a week for 1 month with 0 refills. Low Vitamin D level contributes to fatigue and are associated with obesity, breast, and colon cancer. She agrees to continue to take prescription Vitamin D @50 ,000 IU every week and will follow-up for routine testing of Vitamin D, at least 2-3 times per year to avoid over-replacement.   -Refill Vitamin D, Ergocalciferol, (DRISDOL) 1.25 MG (50000 UNIT) CAPS capsule; Take 1 capsule (50,000 Units total) by mouth every 7 (seven) days.  Dispense: 4 capsule; Refill: 0  3. Iron deficiency anemia, unspecified iron deficiency anemia type [place] appointment with PCP tomorrow to discuss.  4. Obesity with current BMI of 40.91 [name] is currently in the action stage of change. As such, her goal is to continue with weight loss efforts. She has agreed to following a lower carbohydrate, vegetable and lean protein rich diet plan.   Labs reviewed in chart with Marshae from 03/27/22.  Exercise goals: All adults should avoid inactivity. Some physical activity is better than none, and adults who participate in any amount of physical activity gain some health benefits.  Behavioral modification strategies: increasing lean protein intake, increasing water intake, and holiday eating strategies .  Robin Vazquez has agreed to follow-up with our clinic in 4 weeks. She was informed of the importance of frequent follow-up visits to maximize her success with intensive lifestyle modifications for her multiple health conditions.   Objective:   Blood pressure 132/89, pulse 86,  temperature 98.3 F (36.8 C), temperature source Oral, height 4\' 7"  (1.397 m), weight 179 lb (81.2 kg), SpO2 100 %. Body mass index is 41.6 kg/m.  General: Cooperative, alert, well developed, in no  acute distress. HEENT: Conjunctivae and lids unremarkable. Cardiovascular: Regular rhythm.  Lungs: Normal work of breathing. Neurologic: No focal deficits.   Lab Results  Component Value Date   CREATININE 0.45 03/27/2022   BUN 10 03/27/2022   NA 135 03/27/2022   K 3.6 03/27/2022   CL 108 03/27/2022   CO2 21 (L) 03/27/2022   Lab Results  Component Value Date   ALT 17 03/27/2022   AST 17 03/27/2022   ALKPHOS 59 03/27/2022   BILITOT 0.4 03/27/2022   Lab Results  Component Value Date   HGBA1C 7.2 (H) 03/27/2022   HGBA1C 8.3 (H) 12/05/2021   HGBA1C 9.8 (H) 06/08/2021   HGBA1C 8.1 (H) 03/13/2021   HGBA1C 8.8 (H) 11/17/2020   No results found for: "INSULIN" Lab Results  Component Value Date   TSH 1.127 03/27/2022   Lab Results  Component Value Date   CHOL 158 03/27/2022   HDL 40 (L) 03/27/2022   LDLCALC 107 (H) 03/27/2022   TRIG 57 03/27/2022   CHOLHDL 4.0 03/27/2022   Lab Results  Component Value Date   VD25OH 53.97 12/05/2021   VD25OH 27.32 (L) 06/08/2021   VD25OH 38.30 04/04/2020   Lab Results  Component Value Date   WBC 8.0 03/27/2022   HGB 9.7 (L) 03/27/2022   HCT 33.6 (L) 03/27/2022   MCV 65.6 (L) 03/27/2022   PLT 629 (H) 03/27/2022   No results found for: "IRON", "TIBC", "FERRITIN"  Attestation Statements:   Reviewed by clinician on day of visit: allergies, medications, problem list, medical history, surgical history, family history, social history, and previous encounter notes.  I, Brendell Tyus, RMA, am acting as transcriptionist for Irene Limbo, FNP.  I have reviewed the above documentation for accuracy and completeness, and I agree with the above. Irene Limbo, FNP

## 2022-05-06 ENCOUNTER — Other Ambulatory Visit: Payer: Self-pay | Admitting: Nurse Practitioner

## 2022-05-06 DIAGNOSIS — E1165 Type 2 diabetes mellitus with hyperglycemia: Secondary | ICD-10-CM

## 2022-05-10 ENCOUNTER — Ambulatory Visit: Payer: Medicaid Other | Admitting: Nurse Practitioner

## 2022-05-15 ENCOUNTER — Ambulatory Visit: Payer: Medicaid Other | Admitting: Nurse Practitioner

## 2022-05-17 ENCOUNTER — Encounter: Payer: Self-pay | Admitting: Nurse Practitioner

## 2022-05-17 ENCOUNTER — Ambulatory Visit: Payer: Medicaid Other | Admitting: Nurse Practitioner

## 2022-05-17 VITALS — BP 127/89 | HR 82 | Temp 99.1°F | Ht <= 58 in | Wt 183.0 lb

## 2022-05-17 DIAGNOSIS — Z6841 Body Mass Index (BMI) 40.0 and over, adult: Secondary | ICD-10-CM | POA: Diagnosis not present

## 2022-05-17 DIAGNOSIS — Z7985 Long-term (current) use of injectable non-insulin antidiabetic drugs: Secondary | ICD-10-CM

## 2022-05-17 DIAGNOSIS — E669 Obesity, unspecified: Secondary | ICD-10-CM | POA: Diagnosis not present

## 2022-05-17 DIAGNOSIS — E66813 Obesity, class 3: Secondary | ICD-10-CM

## 2022-05-17 DIAGNOSIS — E1165 Type 2 diabetes mellitus with hyperglycemia: Secondary | ICD-10-CM | POA: Diagnosis not present

## 2022-05-17 DIAGNOSIS — D649 Anemia, unspecified: Secondary | ICD-10-CM

## 2022-05-17 MED ORDER — SEMAGLUTIDE (1 MG/DOSE) 4 MG/3ML ~~LOC~~ SOPN: 1.0000 mg | PEN_INJECTOR | SUBCUTANEOUS | 0 refills | Status: DC

## 2022-06-05 NOTE — Progress Notes (Signed)
Chief Complaint:   OBESITY Robin Vazquez is here to discuss her progress with her obesity treatment plan along with follow-up of her obesity related diagnoses. Robin Vazquez is on following a lower carbohydrate, vegetable and lean protein rich diet plan and states she is following her eating plan approximately ?% of the time.   Today's visit was #: 14 Starting weight: 186 lbs Starting date: 05/31/2021 Today's weight: 183 lbs Today's date: 05/17/2022 Total lbs lost to date: 3 lbs Total lbs lost since last in-office visit: 0  Interim History: Robin Vazquez has gotten off track since her last visit.  She is starting [employer] on January 2nd for 3 weeks.  Subjective:   1. Poorly controlled type 2 diabetes mellitus with complication (HCC) Robin Vazquez is taking Ozempic 1 mg.  Denies side effects.  Blood sugars are 80-136.  Denies hypoglycemia.  Also taking Glucotrol 10 mg.  Last eye exam: 2023  2. Anemia, unspecified type Robin Vazquez discussed with PCP.  Taking iron daily and plans to make appointment with GYN.  Assessment/Plan:   1. Poorly controlled type 2 diabetes mellitus with complication (HCC) We will refill Ozempic 1 mg subcu once weekly for 1 month with 0 refills.  Side effects discussed.    Good blood sugar control is important to decrease the likelihood of diabetic complications such as nephropathy, neuropathy, limb loss, blindness, coronary artery disease, and death. Intensive lifestyle modification including diet, exercise and weight loss are the first line of treatment for diabetes.    -Refill Semaglutide, 1 MG/DOSE, 4 MG/3ML SOPN; Inject 1 mg as directed once a week.  Dispense: 3 mL; Refill: 0  2. Anemia, unspecified type Continue to follow-up with PCP and GYN.  Continue meds as directed.    3. Obesity with current BMI of 40.91 Robin Vazquez is currently in the action stage of change. As such, her goal is to continue with weight loss efforts. She has agreed to following a lower  carbohydrate, vegetable and lean protein rich diet plan.   Exercise goals: All adults should avoid inactivity. Some physical activity is better than none, and adults who participate in any amount of physical activity gain some health benefits.  Youtube Videos.  Behavioral modification strategies: increasing lean protein intake, increasing water intake, and holiday eating strategies .  Robin Vazquez has agreed to follow-up with our clinic in 4 weeks. She was informed of the importance of frequent follow-up visits to maximize her success with intensive lifestyle modifications for her multiple health conditions.   Objective:   Blood pressure 127/89, pulse 82, temperature 99.1 F (37.3 C), temperature source Oral, height '4\' 7"'$  (1.397 m), weight 183 lb (83 kg), SpO2 99 %. Body mass index is 42.53 kg/m.  General: Cooperative, alert, well developed, in no acute distress. HEENT: Conjunctivae and lids unremarkable. Cardiovascular: Regular rhythm.  Lungs: Normal work of breathing. Neurologic: No focal deficits.   Lab Results  Component Value Date   CREATININE 0.45 03/27/2022   BUN 10 03/27/2022   NA 135 03/27/2022   K 3.6 03/27/2022   CL 108 03/27/2022   CO2 21 (L) 03/27/2022   Lab Results  Component Value Date   ALT 17 03/27/2022   AST 17 03/27/2022   ALKPHOS 59 03/27/2022   BILITOT 0.4 03/27/2022   Lab Results  Component Value Date   HGBA1C 7.2 (H) 03/27/2022   HGBA1C 8.3 (H) 12/05/2021   HGBA1C 9.8 (H) 06/08/2021   HGBA1C 8.1 (H) 03/13/2021   HGBA1C 8.8 (H) 11/17/2020  No results found for: "INSULIN" Lab Results  Component Value Date   TSH 1.127 03/27/2022   Lab Results  Component Value Date   CHOL 158 03/27/2022   HDL 40 (L) 03/27/2022   LDLCALC 107 (H) 03/27/2022   TRIG 57 03/27/2022   CHOLHDL 4.0 03/27/2022   Lab Results  Component Value Date   VD25OH 53.97 12/05/2021   VD25OH 27.32 (L) 06/08/2021   VD25OH 38.30 04/04/2020   Lab Results  Component Value Date    WBC 8.0 03/27/2022   HGB 9.7 (L) 03/27/2022   HCT 33.6 (L) 03/27/2022   MCV 65.6 (L) 03/27/2022   PLT 629 (H) 03/27/2022   No results found for: "IRON", "TIBC", "FERRITIN"  Attestation Statements:   Reviewed by clinician on day of visit: allergies, medications, problem list, medical history, surgical history, family history, social history, and previous encounter notes.  I, Brendell Tyus, RMA, am acting as transcriptionist for Everardo Pacific, FNP.  I have reviewed the above documentation for accuracy and completeness, and I agree with the above. Everardo Pacific, FNP

## 2022-06-08 ENCOUNTER — Other Ambulatory Visit: Payer: Self-pay | Admitting: Nurse Practitioner

## 2022-06-08 DIAGNOSIS — E1165 Type 2 diabetes mellitus with hyperglycemia: Secondary | ICD-10-CM

## 2022-06-12 ENCOUNTER — Other Ambulatory Visit: Payer: Self-pay | Admitting: Nurse Practitioner

## 2022-06-12 DIAGNOSIS — E1165 Type 2 diabetes mellitus with hyperglycemia: Secondary | ICD-10-CM

## 2022-06-14 ENCOUNTER — Other Ambulatory Visit: Payer: Self-pay | Admitting: Nurse Practitioner

## 2022-06-14 DIAGNOSIS — E1165 Type 2 diabetes mellitus with hyperglycemia: Secondary | ICD-10-CM

## 2022-06-14 MED ORDER — SEMAGLUTIDE (1 MG/DOSE) 4 MG/3ML ~~LOC~~ SOPN: 1.0000 mg | PEN_INJECTOR | SUBCUTANEOUS | 0 refills | Status: DC

## 2022-06-19 ENCOUNTER — Ambulatory Visit: Payer: Medicaid Other | Admitting: Nurse Practitioner

## 2022-06-19 ENCOUNTER — Encounter: Payer: Self-pay | Admitting: Nurse Practitioner

## 2022-06-19 VITALS — BP 121/87 | HR 80 | Temp 98.4°F | Ht <= 58 in

## 2022-06-19 DIAGNOSIS — Z7985 Long-term (current) use of injectable non-insulin antidiabetic drugs: Secondary | ICD-10-CM

## 2022-06-19 DIAGNOSIS — E118 Type 2 diabetes mellitus with unspecified complications: Secondary | ICD-10-CM

## 2022-06-19 DIAGNOSIS — Z6841 Body Mass Index (BMI) 40.0 and over, adult: Secondary | ICD-10-CM

## 2022-06-19 DIAGNOSIS — E1165 Type 2 diabetes mellitus with hyperglycemia: Secondary | ICD-10-CM

## 2022-06-19 DIAGNOSIS — E669 Obesity, unspecified: Secondary | ICD-10-CM

## 2022-06-19 DIAGNOSIS — E559 Vitamin D deficiency, unspecified: Secondary | ICD-10-CM

## 2022-06-19 DIAGNOSIS — Z7984 Long term (current) use of oral hypoglycemic drugs: Secondary | ICD-10-CM

## 2022-06-28 NOTE — Progress Notes (Signed)
Chief Complaint:   OBESITY Robin Vazquez is here to discuss her progress with her obesity treatment plan along with follow-up of her obesity related diagnoses. Robin Vazquez is on following a lower carbohydrate, vegetable and lean protein rich diet plan and states she is following her eating plan approximately 80% of the time. Robin Vazquez states she is walking 15-20 minutes 2-3 times per week.  Today's visit was #: 15 Starting weight: 186 lbs Starting date: 05/31/2021 Today's weight: 178 lbs Today's date: 06/19/2022 Total lbs lost to date: 8 lbs Total lbs lost since last in-office visit: 5  Interim History: Robin Vazquez has done well with weight loss since her last visit.  Followed Robin Vazquez fast for 21 days.  Today restarting LCP.  Denies polyphagia or cravings.   Subjective:   1. Poorly controlled type 2 diabetes mellitus with complication (HCC) Taking Ozempic 1 mg, Glipizide 10 mg and Farxiga 10 mg.  Denies any side effects.  Fasting blood sugars, 96-143.  Denies hypoglycemia.  On a statin not an ACE or ARB.  Last eye exam:2023.  2. Vitamin D deficiency Currently not taking Vit D.  Taking a multivitamin.  Assessment/Plan:   1. Poorly controlled type 2 diabetes mellitus with complication (HCC) Take medications as directed.  Good blood sugar control is important to decrease the likelihood of diabetic complications such as nephropathy, neuropathy, limb loss, blindness, coronary artery disease, and death. Intensive lifestyle modification including diet, exercise and weight loss are the first line of treatment for diabetes.    2. Vitamin D deficiency We will obtain labs today.  - VITAMIN D 25 Hydroxy (Vit-D Deficiency, Fractures); Future - VITAMIN D 25 Hydroxy (Vit-D Deficiency, Fractures)  3. Obesity with current BMI of 40.91 Robin Vazquez is currently in the action stage of change. As such, her goal is to continue with weight loss efforts. She has agreed to following a lower carbohydrate, vegetable  and lean protein rich diet plan.   Exercise goals: All adults should avoid inactivity. Some physical activity is better than none, and adults who participate in any amount of physical activity gain some health benefits.  Has a follow up appointment with PCP for a follow up and labs.  Behavioral modification strategies: increasing lean protein intake, increasing vegetables, and increasing water intake.  Robin Vazquez has agreed to follow-up with our clinic in 4 weeks. She was informed of the importance of frequent follow-up visits to maximize her success with intensive lifestyle modifications for her multiple health conditions.   Robin Vazquez was informed we would discuss her lab results at her next visit unless there is a critical issue that needs to be addressed sooner. Robin Vazquez agreed to keep her next visit at the agreed upon time to discuss these results.  Objective:   Blood pressure 121/87, pulse 80, temperature 98.4 F (36.9 C), height '4\' 7"'$  (1.397 m), SpO2 99 %. Body mass index is 42.53 kg/m.  General: Cooperative, alert, well developed, in no acute distress. HEENT: Conjunctivae and lids unremarkable. Cardiovascular: Regular rhythm.  Lungs: Normal work of breathing. Neurologic: No focal deficits.   Lab Results  Component Value Date   CREATININE 0.45 03/27/2022   BUN 10 03/27/2022   NA 135 03/27/2022   K 3.6 03/27/2022   CL 108 03/27/2022   CO2 21 (L) 03/27/2022   Lab Results  Component Value Date   ALT 17 03/27/2022   AST 17 03/27/2022   ALKPHOS 59 03/27/2022   BILITOT 0.4 03/27/2022   Lab Results  Component Value Date  HGBA1C 7.2 (H) 03/27/2022   HGBA1C 8.3 (H) 12/05/2021   HGBA1C 9.8 (H) 06/08/2021   HGBA1C 8.1 (H) 03/13/2021   HGBA1C 8.8 (H) 11/17/2020   No results found for: "INSULIN" Lab Results  Component Value Date   TSH 1.127 03/27/2022   Lab Results  Component Value Date   CHOL 158 03/27/2022   HDL 40 (L) 03/27/2022   LDLCALC 107 (H) 03/27/2022   TRIG  57 03/27/2022   CHOLHDL 4.0 03/27/2022   Lab Results  Component Value Date   VD25OH 53.97 12/05/2021   VD25OH 27.32 (L) 06/08/2021   VD25OH 38.30 04/04/2020   Lab Results  Component Value Date   WBC 8.0 03/27/2022   HGB 9.7 (L) 03/27/2022   HCT 33.6 (L) 03/27/2022   MCV 65.6 (L) 03/27/2022   PLT 629 (H) 03/27/2022   No results found for: "IRON", "TIBC", "FERRITIN"  Attestation Statements:   Reviewed by clinician on day of visit: allergies, medications, problem list, medical history, surgical history, family history, social history, and previous encounter notes.  Time spent on visit including pre-visit chart review and post-visit care and charting was 30 minutes.   I, Brendell Tyus, RMA, am acting as transcriptionist for Everardo Pacific, FNP.  I have reviewed the above documentation for accuracy and completeness, and I agree with the above. Everardo Pacific, FNP

## 2022-07-10 ENCOUNTER — Other Ambulatory Visit
Admission: RE | Admit: 2022-07-10 | Discharge: 2022-07-10 | Disposition: A | Discharge status: Home / Self Care | Payer: Medicaid Other | Attending: Nurse Practitioner | Admitting: Nurse Practitioner

## 2022-07-10 ENCOUNTER — Other Ambulatory Visit
Admission: RE | Admit: 2022-07-10 | Discharge: 2022-07-10 | Disposition: A | Discharge status: Home / Self Care | Payer: Medicaid Other | Source: Home / Self Care | Attending: Nurse Practitioner | Admitting: Nurse Practitioner

## 2022-07-10 DIAGNOSIS — E559 Vitamin D deficiency, unspecified: Secondary | ICD-10-CM | POA: Insufficient documentation

## 2022-07-10 DIAGNOSIS — R5383 Other fatigue: Secondary | ICD-10-CM | POA: Diagnosis not present

## 2022-07-10 DIAGNOSIS — E782 Mixed hyperlipidemia: Secondary | ICD-10-CM | POA: Diagnosis not present

## 2022-07-10 DIAGNOSIS — E1165 Type 2 diabetes mellitus with hyperglycemia: Secondary | ICD-10-CM | POA: Insufficient documentation

## 2022-07-10 DIAGNOSIS — D509 Iron deficiency anemia, unspecified: Secondary | ICD-10-CM | POA: Diagnosis present

## 2022-07-10 LAB — CBC
HCT: 45.6 % (ref 36.0–46.0)
Hemoglobin: 14.2 g/dL (ref 12.0–15.0)
MCH: 24.5 pg — ABNORMAL LOW (ref 26.0–34.0)
MCHC: 31.1 g/dL (ref 30.0–36.0)
MCV: 78.8 fL — ABNORMAL LOW (ref 80.0–100.0)
Platelets: 413 10*3/uL — ABNORMAL HIGH (ref 150–400)
RBC: 5.79 MIL/uL — ABNORMAL HIGH (ref 3.87–5.11)
RDW: 15 % (ref 11.5–15.5)
WBC: 7.3 10*3/uL (ref 4.0–10.5)
nRBC: 0 % (ref 0.0–0.2)

## 2022-07-10 LAB — HEMOGLOBIN A1C
Hgb A1c MFr Bld: 6.3 % — ABNORMAL HIGH (ref 4.8–5.6)
Mean Plasma Glucose: 134.11 mg/dL

## 2022-07-10 LAB — COMPREHENSIVE METABOLIC PANEL
ALT: 20 U/L (ref 0–44)
AST: 20 U/L (ref 15–41)
Albumin: 4.3 g/dL (ref 3.5–5.0)
Alkaline Phosphatase: 58 U/L (ref 38–126)
Anion gap: 13 (ref 5–15)
BUN: 12 mg/dL (ref 6–20)
CO2: 19 mmol/L — ABNORMAL LOW (ref 22–32)
Calcium: 9.3 mg/dL (ref 8.9–10.3)
Chloride: 102 mmol/L (ref 98–111)
Creatinine, Ser: 0.45 mg/dL (ref 0.44–1.00)
GFR, Estimated: >60 mL/min (ref >60)
Glucose, Bld: 142 mg/dL — ABNORMAL HIGH (ref 70–99)
Potassium: 3.8 mmol/L (ref 3.5–5.1)
Sodium: 134 mmol/L — ABNORMAL LOW (ref 135–145)
Total Bilirubin: 0.6 mg/dL (ref 0.3–1.2)
Total Protein: 8.3 g/dL — ABNORMAL HIGH (ref 6.5–8.1)

## 2022-07-10 LAB — LIPID PANEL
Cholesterol: 200 mg/dL (ref 0–200)
HDL: 46 mg/dL (ref >40)
LDL Cholesterol: 145 mg/dL — ABNORMAL HIGH (ref 0–99)
Total CHOL/HDL Ratio: 4.3 RATIO
Triglycerides: 44 mg/dL (ref <150)
VLDL: 9 mg/dL (ref 0–40)

## 2022-07-10 LAB — TSH: TSH: 1.388 u[IU]/mL (ref 0.350–4.500)

## 2022-07-12 ENCOUNTER — Ambulatory Visit: Payer: Medicaid Other | Admitting: Nurse Practitioner

## 2022-07-17 ENCOUNTER — Ambulatory Visit: Payer: Medicaid Other | Admitting: Nurse Practitioner

## 2022-07-17 ENCOUNTER — Encounter: Payer: Self-pay | Admitting: Nurse Practitioner

## 2022-07-17 VITALS — BP 131/89 | HR 77 | Temp 98.0°F | Ht <= 58 in | Wt 180.0 lb

## 2022-07-17 DIAGNOSIS — E118 Type 2 diabetes mellitus with unspecified complications: Secondary | ICD-10-CM | POA: Diagnosis not present

## 2022-07-17 DIAGNOSIS — I152 Hypertension secondary to endocrine disorders: Secondary | ICD-10-CM

## 2022-07-17 DIAGNOSIS — Z6841 Body Mass Index (BMI) 40.0 and over, adult: Secondary | ICD-10-CM

## 2022-07-17 DIAGNOSIS — E1165 Type 2 diabetes mellitus with hyperglycemia: Secondary | ICD-10-CM

## 2022-07-17 DIAGNOSIS — E782 Mixed hyperlipidemia: Secondary | ICD-10-CM

## 2022-07-17 DIAGNOSIS — Z7985 Long-term (current) use of injectable non-insulin antidiabetic drugs: Secondary | ICD-10-CM

## 2022-07-17 DIAGNOSIS — E559 Vitamin D deficiency, unspecified: Secondary | ICD-10-CM

## 2022-07-17 DIAGNOSIS — E1169 Type 2 diabetes mellitus with other specified complication: Secondary | ICD-10-CM | POA: Diagnosis not present

## 2022-07-17 DIAGNOSIS — E1159 Type 2 diabetes mellitus with other circulatory complications: Secondary | ICD-10-CM | POA: Diagnosis not present

## 2022-07-17 DIAGNOSIS — Z7984 Long term (current) use of oral hypoglycemic drugs: Secondary | ICD-10-CM

## 2022-07-17 MED ORDER — SEMAGLUTIDE (1 MG/DOSE) 4 MG/3ML ~~LOC~~ SOPN: 1.0000 mg | PEN_INJECTOR | SUBCUTANEOUS | 0 refills | Status: DC

## 2022-07-17 NOTE — Progress Notes (Signed)
Office: [phone]  /  Fax: [place] [facility] Height: 4' 7"$  (1.397 m) Weight: 180 lb (81.6 kg) Temp: 98 F (36.7 C) Pulse Rate: 77 BP: 131/89 SpO2: 98 % Fasting: Yes Today's Visit #: 16 Weight at Last VIsit: 178lb Weight Lost Since Last Visit: 0lb Starting Date: 05/31/21 Starting Weight: 186lb Total Weight Loss (lbs): 6 lb (2.722 kg)    HPI  Chief Complaint: OBESITY  Robin Vazquez is here to discuss her progress with her obesity treatment plan. She is on the following a lower carbohydrate, vegetable and lean protein rich diet plan and states she is following her eating plan approximately 60 % of the time. She states she is exercising 15 minutes 2 days per week.   Interval History:  Since last office visit she has gained 2 pounds.  She is following low carb plan.  She celebrated the [employer] since her last visit.  Aiming to drink more water.    Pharmacotherapy for weight loss: she is not currently taking any medications for medical weight loss.      Previous pharmacotherapy for medical weight loss:   she has tried Phentermine in the past for medical weight loss.    Pharmacotherapy for DMT2:  she is currently taking Ozempic 271m, Glipizide 10 mg and Farxiga 133m  Since her last visit she had 2 episodes of diarrhea  and nausea after her super bowl party.  Otherwise denies side effects. Last A1c was 6.3 CBGs: 94-117.   Episodes of hypoglycemia: no She is on a statin and ACE.  Last eye exam:  2023 she has tried Metformin in the past.  She stopped Metformin due to side effects of diarrhea.   Lab Results  Component Value Date   HGBA1C 6.3 (H) 07/10/2022   HGBA1C 7.2 (H) 03/27/2022   HGBA1C 8.3 (H) 12/05/2021   Lab Results  Component Value Date   MICROALBUR <3.0 (H) 08/14/2016   LDLCALC 145 (H) 07/10/2022   CREATININE 0.45 07/10/2022     Hypertension Hypertension reasonably well controlled.  Medication(s): Enalapril  71m61mnd Norvasc 71mg78menies side effects.  Denies chest pain, palpitations and SOB.  BP Readings from Last 3 Encounters:  07/17/22 131/89  06/19/22 121/87  05/17/22 127/89   Lab Results  Component Value Date   CREATININE 0.45 07/10/2022   CREATININE 0.45 03/27/2022   CREATININE 0.40 (L) 12/05/2021    Hyperlipidemia Medication(s): Crestor 20mg69mnies side effects.    Lab Results  Component Value Date   CHOL 200 07/10/2022   HDL 46 07/10/2022   LDLCALC 145 (H) 07/10/2022   TRIG 44 07/10/2022   CHOLHDL 4.3 07/10/2022   Lab Results  Component Value Date   ALT 20 07/10/2022   AST 20 07/10/2022   ALKPHOS 58 07/10/2022   BILITOT 0.6 07/10/2022   The ASCVD Risk score (Arnett DK, et al., 2019) failed to calculate for the following reasons:   The 2019 ASCVD risk score is only valid for ages 40 to729   39t D deficiency  She is currently taking a MV, not a Vit D she has taken Vit D 50,000 IU weekly in the past.  Denied side effects while taking.    Lab Results  Component Value Date   VD25OH 53.97 12/05/2021   VD25OH 27.32 (L) 06/08/2021   VD25OH 38.30 04/04/2020      PHYSICAL EXAM:  Blood pressure 131/89, pulse 77, temperature 98 F (36.7 C), height 4' 7"$  (1.397  m), weight 180 lb (81.6 kg), last menstrual period 07/11/2022, SpO2 98 %. Body mass index is 41.84 kg/m.  General: She is overweight, cooperative, alert, well developed, and in no acute distress. PSYCH: Has normal mood, affect and thought process.   Extremities: No edema.  Neurologic: No gross sensory or motor deficits. No tremors or fasciculations noted.    DIAGNOSTIC DATA REVIEWED:  BMET    Component Value Date/Time   NA 134 (L) 07/10/2022 1254   K 3.8 07/10/2022 1254   CL 102 07/10/2022 1254   CO2 19 (L) 07/10/2022 1254   GLUCOSE 142 (H) 07/10/2022 1254   BUN 12 07/10/2022 1254   CREATININE 0.45 07/10/2022 1254   CALCIUM 9.3 07/10/2022 1254   GFRNONAA >60 07/10/2022 1254   GFRAA >60 11/02/2019  1401   Lab Results  Component Value Date   HGBA1C 6.3 (H) 07/10/2022   HGBA1C 6.2 (H) 07/28/2015   No results found for: "INSULIN" Lab Results  Component Value Date   TSH 1.388 07/10/2022   CBC    Component Value Date/Time   WBC 7.3 07/10/2022 1254   RBC 5.79 (H) 07/10/2022 1254   HGB 14.2 07/10/2022 1254   HCT 45.6 07/10/2022 1254   PLT 413 (H) 07/10/2022 1254   MCV 78.8 (L) 07/10/2022 1254   MCH 24.5 (L) 07/10/2022 1254   MCHC 31.1 07/10/2022 1254   RDW 15.0 07/10/2022 1254   Iron Studies No results found for: "IRON", "TIBC", "FERRITIN", "IRONPCTSAT" Lipid Panel     Component Value Date/Time   CHOL 200 07/10/2022 1254   TRIG 44 07/10/2022 1254   HDL 46 07/10/2022 1254   CHOLHDL 4.3 07/10/2022 1254   VLDL 9 07/10/2022 1254   LDLCALC 145 (H) 07/10/2022 1254   Hepatic Function Panel     Component Value Date/Time   PROT 8.3 (H) 07/10/2022 1254   ALBUMIN 4.3 07/10/2022 1254   AST 20 07/10/2022 1254   ALT 20 07/10/2022 1254   ALKPHOS 58 07/10/2022 1254   BILITOT 0.6 07/10/2022 1254      Component Value Date/Time   TSH 1.388 07/10/2022 1254   Nutritional Lab Results  Component Value Date   VD25OH 53.97 12/05/2021   VD25OH 27.32 (L) 06/08/2021   VD25OH 38.30 04/04/2020     ASSESSMENT AND PLAN  TREATMENT PLAN FOR OBESITY:  Recommended Dietary Goals  Jeffrie is currently in the action stage of change. As such, her goal is to continue weight management plan. She has agreed to following a lower carbohydrate, vegetable and lean protein rich diet plan.  Behavioral Intervention  We discussed the following Behavioral Modification Strategies today: increasing lean protein intake, decreasing simple carbohydrates , increasing vegetables, avoid skipping meals, increase water intake, work on meal planning and easy cooking plans, and think about ways to increase physical activity.  Additional resources provided today: NA  Recommended Physical Activity  Goals  Dinara has been advised to work up to 150 minutes of moderate intensity aerobic activity a week and strengthening exercises 2-3 times per week for cardiovascular health, weight loss maintenance and preservation of muscle mass.   She has agreed to increase physical activity in their day and reduce sedentary time (increase NEAT).  and Patient also encouraged on scheduling and tracking physical activity.    ASSOCIATED CONDITIONS ADDRESSED TODAY  Action/Plan  Poorly controlled type 2 diabetes mellitus with complication (HCC) -     Semaglutide (1 MG/DOSE); Inject 1 mg as directed once a week.  Dispense: 3 mL; Refill:  0. Side effects discussed.  Continue medications as directed.   Good blood sugar control is important to decrease the likelihood of diabetic complications such as nephropathy, neuropathy, limb loss, blindness, coronary artery disease, and death. Intensive lifestyle modification including diet, exercise and weight loss are the first line of treatment for diabetes.    Hypertension associated with type 2 diabetes mellitus ([place]) Continue to follow-up with PCP.  Continue medications as directed.  Nyota is working on healthy weight loss and exercise to improve blood pressure control. We will watch for signs of hypotension as she continues her lifestyle modifications.   Mixed diabetic hyperlipidemia associated with type 2 diabetes mellitus ([place]) Continue follow-up with PCP.  Continue medications as directed.  Vitamin D deficiency Last vitamin D looked better.  Continue multivitamin.  Will continue to monitor.  Morbid obesity ([place])  BMI 40.0-44.9, adult ([place])    Discussed repeating IC.  Patient is not interested in repeating at this time.  Return in about 4 weeks (around 08/14/2022).Marland Kitchen She was informed of the importance of frequent follow up visits to maximize her success with intensive lifestyle modifications for her multiple health conditions.   ATTESTASTION  STATEMENTS:  Reviewed by clinician on day of visit: allergies, medications, problem list, medical history, surgical history, family history, social history, and previous encounter notes.     Ailene Rud. Amry Cathy FNP-C

## 2022-07-19 LAB — VITAMIN D 25 HYDROXY (VIT D DEFICIENCY, FRACTURES)

## 2022-07-24 ENCOUNTER — Ambulatory Visit: Payer: Medicaid Other | Admitting: Nurse Practitioner

## 2022-07-26 ENCOUNTER — Ambulatory Visit: Payer: Medicaid Other | Admitting: Nurse Practitioner

## 2022-07-26 VITALS — BP 122/80 | HR 85 | Ht <= 58 in | Wt 184.0 lb

## 2022-07-26 DIAGNOSIS — N390 Urinary tract infection, site not specified: Secondary | ICD-10-CM

## 2022-07-26 DIAGNOSIS — E1159 Type 2 diabetes mellitus with other circulatory complications: Secondary | ICD-10-CM

## 2022-07-26 DIAGNOSIS — Q688 Other specified congenital musculoskeletal deformities: Secondary | ICD-10-CM | POA: Diagnosis not present

## 2022-07-26 DIAGNOSIS — E782 Mixed hyperlipidemia: Secondary | ICD-10-CM | POA: Diagnosis not present

## 2022-07-26 DIAGNOSIS — I152 Hypertension secondary to endocrine disorders: Secondary | ICD-10-CM

## 2022-07-26 MED ORDER — CIPROFLOXACIN HCL 500 MG PO TABS: 500.0000 mg | ORAL_TABLET | Freq: Two times a day (BID) | ORAL | 0 refills | Status: AC

## 2022-07-26 MED ORDER — FLUCONAZOLE 150 MG PO TABS: 150.0000 mg | ORAL_TABLET | Freq: Every day | ORAL | 0 refills | Status: DC

## 2022-07-26 NOTE — Patient Instructions (Signed)
1) 4 month follow up 2) Pt advised to follow low chol and take rosuvastatin 3) Vit E oil to left leg 4) Magnesium 250 mg nightly at bedtime  5) Take cipro then diflucan 6) Follow up appt in 4 months, fasting labs prior

## 2022-07-26 NOTE — Progress Notes (Signed)
Established Patient Office Visit  Subjective:  Patient ID: Robin Vazquez, female    DOB: 06/13/1985  Age: 37 y.o. MRN: [id number]  Chief Complaint  Patient presents with   Follow-up    3 months follow up    Follow up with lab results.  Lower back hurts x 2 weeks, having flank pain.  Also has vaginitis.  A1c is at 6.3%, pt congratulated and also will cont to work on LDL which is not at goal.  She has forgot to take rosuvastatin and will improve on taking.     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    Diabetes mellitus without complication (HCC)    Herpes    genitial   History of uterine fibroid 09/2015   Hyperlipemia    Hypertension    Obesity    Other fatigue     Social History   Socioeconomic History   Marital status: Single    Spouse name: Not on file   Number of children: Not on file   Years of education: Not on file   Highest education level: Not on file  Occupational History   Occupation: Disabled    Employer: Not employed  Tobacco Use   Smoking status: Never   Smokeless tobacco: Never  Vaping Use   Vaping Use: Never used  Substance and Sexual Activity   Alcohol use: No   Drug use: No   Sexual activity: Not Currently    Birth control/protection: None  Other Topics Concern   Not on file  Social History Narrative   Not on file   Social Determinants of Health   Financial Resource Strain: Not on file  Food Insecurity: Not on file  Transportation Needs: Not on file  Physical Activity: Not on file  Stress: Not on file  Social Connections: Not on file  Intimate Partner Violence: Not on file    Family History  Adopted: Yes  Family history unknown: Yes    No Known Allergies  Review of Systems  Constitutional: Negative.   HENT: Negative.    Eyes: Negative.   Respiratory: Negative.     Cardiovascular: Negative.   Gastrointestinal: Negative.   Genitourinary: Negative.   Musculoskeletal:  Positive for back pain and myalgias.  Skin: Negative.   Neurological: Negative.   Endo/Heme/Allergies: Negative.   Psychiatric/Behavioral: Negative.         Objective:   BP 122/80   Pulse 85   Ht '4\' 7"'$  (1.397 m)   Wt 184 lb (83.5 kg)   LMP 07/11/2022 (Exact Date)   SpO2 99%   BMI 42.77 kg/m   Vitals:   07/26/22 1457  BP: 122/80  Pulse: 85  Height: '4\' 7"'$  (1.397 m)  Weight: 184 lb (83.5 kg)  SpO2: 99%  BMI (Calculated): 42.77    Physical Exam Vitals reviewed.  Constitutional:      Appearance: Normal appearance.  HENT:     Head: Normocephalic.     Nose: Nose normal.     Mouth/Throat:     Mouth: Mucous membranes are moist.  Eyes:     Pupils: Pupils are equal, round, and reactive to light.  Cardiovascular:     Rate and Rhythm: Normal rate and regular rhythm.  Pulmonary:     Effort: Pulmonary effort is normal.     Breath sounds: Normal breath sounds.  Abdominal:  General: Bowel sounds are normal.     Palpations: Abdomen is soft.  Musculoskeletal:     Cervical back: Normal range of motion and neck supple.     Right lower leg: Edema present.     Left lower leg: Edema present.  Skin:    General: Skin is warm and dry.  Neurological:     Mental Status: She is alert and oriented to person, place, and time.  Psychiatric:        Mood and Affect: Mood normal.        Behavior: Behavior normal.      No results found for any visits on 07/26/22.  Recent Results (from the past 2160 hour(s))  CBC     Status: Abnormal   Collection Time: 07/10/22 12:54 PM  Result Value Ref Range   WBC 7.3 4.0 - 10.5 K/uL   RBC 5.79 (H) 3.87 - 5.11 MIL/uL   Hemoglobin 14.2 12.0 - 15.0 g/dL   HCT 45.6 36.0 - 46.0 %   MCV 78.8 (L) 80.0 - 100.0 fL   MCH 24.5 (L) 26.0 - 34.0 pg   MCHC 31.1 30.0 - 36.0 g/dL   RDW 15.0 11.5 - 15.5 %   Platelets 413 (H) 150 - 400 K/uL   nRBC  0.0 0.0 - 0.2 %    Comment: Performed at [facility], [address] [place], [place] [address]  Comprehensive metabolic panel     Status: Abnormal   Collection Time: 07/10/22 12:54 PM  Result Value Ref Range   Sodium 134 (L) 135 - 145 mmol/L   Potassium 3.8 3.5 - 5.1 mmol/L   Chloride 102 98 - 111 mmol/L   CO2 19 (L) 22 - 32 mmol/L   Glucose, Bld 142 (H) 70 - 99 mg/dL    Comment: Glucose reference range applies only to samples taken after fasting for at least 8 hours.   BUN 12 6 - 20 mg/dL   Creatinine, Ser 0.45 0.44 - 1.00 mg/dL   Calcium 9.3 8.9 - 10.3 mg/dL   Total Protein 8.3 (H) 6.5 - 8.1 g/dL   Albumin 4.3 3.5 - 5.0 g/dL   AST 20 15 - 41 U/L   ALT 20 0 - 44 U/L   Alkaline Phosphatase 58 38 - 126 U/L   Total Bilirubin 0.6 0.3 - 1.2 mg/dL   GFR, Estimated >60 >60 mL/min    Comment: (NOTE) Calculated using the CKD-EPI Creatinine Equation (2021)    Anion gap 13 5 - 15    Comment: Performed at [facility], [place], [address] [place], [place] [address]  TSH     Status: None   Collection Time: 07/10/22 12:54 PM  Result Value Ref Range   TSH 1.388 0.350 - 4.500 uIU/mL    Comment: Performed by a 3rd Generation assay with a functional sensitivity of <=0.01 uIU/mL. Performed at [facility], [address] [place], [place] [address]   Hemoglobin A1c     Status: Abnormal   Collection Time: 07/10/22 12:54 PM  Result Value Ref Range   Hgb A1c MFr Bld 6.3 (H) 4.8 - 5.6 %    Comment: (NOTE) Pre diabetes:          5.7%-6.4%  Diabetes:              >6.4%  Glycemic control for   <7.0% adults with diabetes    Mean Plasma Glucose 134.11 mg/dL    Comment: Performed at [facility], [address] [place], [place] [address]  Lipid panel     Status: Abnormal   Collection Time: 07/10/22 12:54 PM  Result Value Ref Range   Cholesterol 200 0 - 200 mg/dL   Triglycerides 44 <150 mg/dL   HDL 46 >40 mg/dL   Total CHOL/HDL Ratio 4.3 RATIO   VLDL 9 0 -  40 mg/dL   LDL Cholesterol 145 (H) 0 - 99 mg/dL    Comment:        Total Cholesterol/HDL:CHD Risk Coronary Heart Disease Risk Table                     Men   Women  1/2 Average Risk   3.4   3.3  Average Risk       5.0   4.4  2 X Average Risk   9.6   7.1  3 X Average Risk  23.4   11.0        Use the calculated Patient Ratio above and the CHD Risk Table to determine the patient's CHD Risk.        ATP III CLASSIFICATION (LDL):  <100     mg/dL   Optimal  100-129  mg/dL   Near or Above                    Optimal  130-159  mg/dL   Borderline  160-189  mg/dL   High  >190     mg/dL   Very High Performed at [facility], [address] [place], [place] [address]   VITAMIN D 25 Hydroxy (Vit-D Deficiency, Fractures)     Status: None   Collection Time: 07/10/22 12:54 PM  Result Value Ref Range   Vit D, 25-Hydroxy See Scanned report in [place] [facility] 30 - 100 ng/mL    Comment: Performed at [facility], [place] [address], [place], [place] [address]      Assessment & Plan:   Problem List Items Addressed This Visit       Cardiovascular and Mediastinum   Hypertension associated with diabetes ([place])     Other   Arthrogryposis- congenital anomaly of bone/jts (Chronic)   Hyperlipemia   Other Visit Diagnoses     Urinary tract infection without hematuria, site unspecified    -  Primary   Relevant Medications   ciprofloxacin (CIPRO) 500 MG tablet   fluconazole (DIFLUCAN) 150 MG tablet       Return in about 4 months (around 11/24/2022).   Total time spent: 30 minutes  Evern Bio, NP  07/26/2022

## 2022-08-07 ENCOUNTER — Other Ambulatory Visit: Payer: Self-pay | Admitting: Nurse Practitioner

## 2022-08-07 DIAGNOSIS — E1165 Type 2 diabetes mellitus with hyperglycemia: Secondary | ICD-10-CM

## 2022-08-08 ENCOUNTER — Other Ambulatory Visit: Payer: Self-pay | Admitting: Nurse Practitioner

## 2022-08-08 DIAGNOSIS — E1165 Type 2 diabetes mellitus with hyperglycemia: Secondary | ICD-10-CM

## 2022-08-10 ENCOUNTER — Other Ambulatory Visit: Payer: Self-pay

## 2022-08-15 ENCOUNTER — Encounter: Payer: Self-pay | Admitting: Nurse Practitioner

## 2022-08-15 ENCOUNTER — Telehealth: Payer: Self-pay

## 2022-08-15 ENCOUNTER — Ambulatory Visit: Payer: Medicaid Other | Admitting: Nurse Practitioner

## 2022-08-15 VITALS — BP 131/87 | HR 81 | Temp 98.3°F | Ht <= 58 in | Wt 181.0 lb

## 2022-08-15 DIAGNOSIS — E118 Type 2 diabetes mellitus with unspecified complications: Secondary | ICD-10-CM

## 2022-08-15 DIAGNOSIS — Z7985 Long-term (current) use of injectable non-insulin antidiabetic drugs: Secondary | ICD-10-CM

## 2022-08-15 DIAGNOSIS — E1165 Type 2 diabetes mellitus with hyperglycemia: Secondary | ICD-10-CM | POA: Diagnosis not present

## 2022-08-15 DIAGNOSIS — Z6841 Body Mass Index (BMI) 40.0 and over, adult: Secondary | ICD-10-CM

## 2022-08-15 MED ORDER — SEMAGLUTIDE (1 MG/DOSE) 4 MG/3ML ~~LOC~~ SOPN: 1.0000 mg | PEN_INJECTOR | SUBCUTANEOUS | 0 refills | Status: DC

## 2022-08-15 NOTE — Progress Notes (Signed)
Office: [phone]  /  Fax: [phone]  WEIGHT SUMMARY AND BIOMETRICS  No data recorded Weight Gained Since Last Visit: 1lb   Vitals Temp: 98.3 F (36.8 C) BP: 131/87 Pulse Rate: 81 SpO2: 95 %   Anthropometric Measurements Height: 4\' 7"  (1.397 m) Weight: 181 lb (82.1 kg) BMI (Calculated): 42.07 Weight at Last Visit: 180lb Weight Gained Since Last Visit: 1lb Starting Weight: 186lb Total Weight Loss (lbs): 5 lb (2.268 kg)   No data recorded Other Clinical Data Today's Visit #: 17 Starting Date: 05/31/21     HPI  Chief Complaint: OBESITY  Ty is here to discuss her progress with her obesity treatment plan. She is on the following a lower carbohydrate, vegetable and lean protein rich diet plan and states she is following her eating plan approximately 60 % of the time. She states she is exercising 15 minutes 1 days per week.   Interval History:  Since last office visit she has gained 1 pound.  She has been eating too many carbs.  She feels she is meeting her calories and protein goals but is not completely sure.  Drinking water daily (56-64 oz) and occ juice.     Pharmacotherapy for weight loss: She is not currently taking medications  for medical weight loss.   Previous pharmacotherapy for medical weight loss:  Phentermine    Bariatric surgery:  Patient never had bariatric surgery.    Pharmacotherapy for DMT2:  She is currently taking Ozempic 1mg  (missed last week dose), Glipizide 10 mg and Farxiga 10mg  .  Denies side effects.   Last A1c was 6.3 CBGs: Fasting 113-167 Episodes of hypoglycemia: no On ACE and statin.  Last eye exam:  2023 She has tried Metformin in the past.  She stopped Metformin due to side effects of diarrhea.   Lab Results  Component Value Date   HGBA1C 6.3 (H) 07/10/2022   HGBA1C 7.2 (H) 03/27/2022   HGBA1C 8.3 (H) 12/05/2021   Lab Results  Component Value Date   MICROALBUR <3.0 (H) 08/14/2016   LDLCALC 145 (H) 07/10/2022    CREATININE 0.45 07/10/2022    PHYSICAL EXAM:  Blood pressure 131/87, pulse 81, temperature 98.3 F (36.8 C), height 4\' 7"  (1.397 m), weight 181 lb (82.1 kg), last menstrual period 08/09/2022, SpO2 95 %. Body mass index is 42.07 kg/m.  General: She is overweight, cooperative, alert, well developed, and in no acute distress. PSYCH: Has normal mood, affect and thought process.   Extremities: No edema.  Neurologic: No gross sensory or motor deficits. No tremors or fasciculations noted.    DIAGNOSTIC DATA REVIEWED:  BMET    Component Value Date/Time   NA 134 (L) 07/10/2022 1254   K 3.8 07/10/2022 1254   CL 102 07/10/2022 1254   CO2 19 (L) 07/10/2022 1254   GLUCOSE 142 (H) 07/10/2022 1254   BUN 12 07/10/2022 1254   CREATININE 0.45 07/10/2022 1254   CALCIUM 9.3 07/10/2022 1254   GFRNONAA >60 07/10/2022 1254   GFRAA >60 11/02/2019 1401   Lab Results  Component Value Date   HGBA1C 6.3 (H) 07/10/2022   HGBA1C 6.2 (H) 07/28/2015   No results found for: "INSULIN" Lab Results  Component Value Date   TSH 1.388 07/10/2022   CBC    Component Value Date/Time   WBC 7.3 07/10/2022 1254   RBC 5.79 (H) 07/10/2022 1254   HGB 14.2 07/10/2022 1254   HCT 45.6 07/10/2022 1254   PLT 413 (H) 07/10/2022 1254   MCV  78.8 (L) 07/10/2022 1254   MCH 24.5 (L) 07/10/2022 1254   MCHC 31.1 07/10/2022 1254   RDW 15.0 07/10/2022 1254   Iron Studies No results found for: "IRON", "TIBC", "FERRITIN", "IRONPCTSAT" Lipid Panel     Component Value Date/Time   CHOL 200 07/10/2022 1254   TRIG 44 07/10/2022 1254   HDL 46 07/10/2022 1254   CHOLHDL 4.3 07/10/2022 1254   VLDL 9 07/10/2022 1254   LDLCALC 145 (H) 07/10/2022 1254   Hepatic Function Panel     Component Value Date/Time   PROT 8.3 (H) 07/10/2022 1254   ALBUMIN 4.3 07/10/2022 1254   AST 20 07/10/2022 1254   ALT 20 07/10/2022 1254   ALKPHOS 58 07/10/2022 1254   BILITOT 0.6 07/10/2022 1254      Component Value Date/Time   TSH  1.388 07/10/2022 1254   Nutritional Lab Results  Component Value Date   VD25OH See Scanned report in [place] 07/10/2022   VD25OH 53.97 12/05/2021   VD25OH 27.32 (L) 06/08/2021     ASSESSMENT AND PLAN  TREATMENT PLAN FOR OBESITY:  Recommended Dietary Goals  Robin Vazquez is currently in the action stage of change. As such, her goal is to continue weight management plan. She has agreed to keeping a food journal and adhering to recommended goals of 1200-1300 calories and 75+ protein.  I would like to review her macros at her next visit.    Behavioral Intervention  We discussed the following Behavioral Modification Strategies today: increasing lean protein intake, decreasing simple carbohydrates , increasing vegetables, avoiding skipping meals, increasing water intake, work on meal planning and easy cooking plans, planning for success, and keeping healthy foods at home.  Additional resources provided today: NA  Recommended Physical Activity Goals  Izadora has been advised to work up to 150 minutes of moderate intensity aerobic activity a week and strengthening exercises 2-3 times per week for cardiovascular health, weight loss maintenance and preservation of muscle mass.   She has agreed to Think about ways to increase physical activity and Work on scheduling and tracking physical activity.    ASSOCIATED CONDITIONS ADDRESSED TODAY  Action/Plan  Poorly controlled type 2 diabetes mellitus with complication (HCC) -     Semaglutide (1 MG/DOSE); Inject 1 mg as directed once a week.  Dispense: 3 mL; Refill: 0. Side effects discussed  Continue working on dietary changes, exercise and weight loss.  Would like her to track and will review calories and macros at next visit.    Morbid obesity ([place])  BMI 40.0-44.9, adult ([place])         Return in about 4 weeks (around 09/12/2022).Marland Kitchen She was informed of the importance of frequent follow up visits to maximize her success with  intensive lifestyle modifications for her multiple health conditions.   ATTESTASTION STATEMENTS:  Reviewed by clinician on day of visit: allergies, medications, problem list, medical history, surgical history, family history, social history, and previous encounter notes.    Ailene Rud. Kani Jobson FNP-C

## 2022-08-15 NOTE — Telephone Encounter (Signed)
Received notification that Semaglutide has been approved through patients insurance. Request Reference Number: [id number]. OZEMPIC INJ 4MG /3ML is approved through 08/15/2023

## 2022-08-15 NOTE — Telephone Encounter (Signed)
PA submitted through Cover My Meds for Ozempic. Awaiting insurance determination.  Key: [id number]

## 2022-09-11 ENCOUNTER — Ambulatory Visit: Payer: Medicaid Other | Admitting: Nurse Practitioner

## 2022-09-12 ENCOUNTER — Other Ambulatory Visit: Payer: Self-pay | Admitting: Nurse Practitioner

## 2022-09-12 DIAGNOSIS — E1165 Type 2 diabetes mellitus with hyperglycemia: Secondary | ICD-10-CM

## 2022-09-26 ENCOUNTER — Encounter (INDEPENDENT_AMBULATORY_CARE_PROVIDER_SITE_OTHER): Payer: Self-pay | Admitting: Family Medicine

## 2022-09-26 ENCOUNTER — Telehealth (INDEPENDENT_AMBULATORY_CARE_PROVIDER_SITE_OTHER): Payer: Medicaid Other | Admitting: Family Medicine

## 2022-09-26 DIAGNOSIS — Z7984 Long term (current) use of oral hypoglycemic drugs: Secondary | ICD-10-CM

## 2022-09-26 DIAGNOSIS — E1169 Type 2 diabetes mellitus with other specified complication: Secondary | ICD-10-CM

## 2022-09-26 DIAGNOSIS — Z6841 Body Mass Index (BMI) 40.0 and over, adult: Secondary | ICD-10-CM

## 2022-09-26 DIAGNOSIS — Z7985 Long-term (current) use of injectable non-insulin antidiabetic drugs: Secondary | ICD-10-CM

## 2022-09-26 DIAGNOSIS — E782 Mixed hyperlipidemia: Secondary | ICD-10-CM | POA: Diagnosis not present

## 2022-09-26 MED ORDER — OZEMPIC (1 MG/DOSE) 4 MG/3ML ~~LOC~~ SOPN: 1.0000 mg | PEN_INJECTOR | SUBCUTANEOUS | 0 refills | Status: DC

## 2022-09-26 NOTE — Progress Notes (Signed)
TeleHealth Visit:  This visit was completed with telemedicine (audio/video) technology. Tyrah has verbally consented to this TeleHealth visit. The patient is located at home, the provider is located at home. The participants in this visit include the listed provider and patient. The visit was conducted today via MyChart video.  OBESITY Christain is here to discuss her progress with her obesity treatment plan along with follow-up of her obesity related diagnoses.   Today's visit was # 18 Starting weight: 186 lbs Starting date: 05/31/21 Weight at last in office visit: 181 lbs on 08/15/22 Total weight loss: 5 lbs at last in office visit on 08/15/22. Today's reported weight (09/26/22):  178 lbs  Nutrition Plan: keeping a food journal with goal of 1200-1300 calories and 75 grams of protein daily - 50% adherence.  Current exercise:  none  Interim History:  She has journaled about half the time. She finds it hard to get her protein in. She tends to skip breakfast.  She has noticed that journaling makes her much more aware of what she is eating.  She has lost 3 pounds since her last visit.  Assessment/Plan:  1. Type 2 Diabetes Mellitus with other specified complication, without long-term current use of insulin HgbA1c is at goal. Last A1c was 6.3 on 07/10/2022.  A1c had been 8.3 in July 2023. CBGs: Fasting 100-*115 Episodes of hypoglycemia: no Medication(s): Ozempic 1 mg SQ weekly without side effects. Also taking Farxiga 10 mg daily and glipizide 10 mg before breakfast.  Lab Results  Component Value Date   HGBA1C 6.3 (H) 07/10/2022   HGBA1C 7.2 (H) 03/27/2022   HGBA1C 8.3 (H) 12/05/2021   Lab Results  Component Value Date   MICROALBUR <3.0 (H) 08/14/2016   LDLCALC 145 (H) 07/10/2022   CREATININE 0.45 07/10/2022   No results found for: "GFR"  Plan: Continue and refill Ozempic 1 mg SQ weekly Continue Farxiga 10 mg daily and glipizide 10 mg before breakfast.   2.  Mixed  hyperlipidemia associated with type 2 diabetes LDL is not at goal.  Last LDL was 145 on 07/10/2022.  Managed by PCP. Medication(s): Rosuvastatin 20 mg daily  Lab Results  Component Value Date   CHOL 200 07/10/2022   HDL 46 07/10/2022   LDLCALC 145 (H) 07/10/2022   TRIG 44 07/10/2022   CHOLHDL 4.3 07/10/2022   CHOLHDL 4.0 03/27/2022   CHOLHDL 4.4 12/05/2021   Lab Results  Component Value Date   ALT 20 07/10/2022   AST 20 07/10/2022   ALKPHOS 58 07/10/2022   BILITOT 0.6 07/10/2022   The ASCVD Risk score (Arnett DK, et al., 2019) failed to calculate for the following reasons:   The 2019 ASCVD risk score is only valid for ages 63 to 59  Plan: Continue rosuvastatin 20 mg daily. Consider increasing dose at next lab check  3. Morbid Obesity: Current BMI 42 Dietrich is currently in the action stage of change. As such, her goal is to continue with weight loss efforts.  She has agreed to keeping a food journal with goal of 1300-1500 calories and 90+ grams of protein daily.  1.  Have a protein shake if skipping breakfast. 2.  Journal at least 5 days/week, more if possible.  Exercise goals:  as is  Behavioral modification strategies: increasing lean protein intake, decreasing simple carbohydrates , keep a strict food journal, and increase frequency of journaling.  Kemara has agreed to follow-up with our clinic in 4 weeks.   No orders of the defined types  were placed in this encounter.   Medications Discontinued During This Encounter  Medication Reason   OZEMPIC, 1 MG/DOSE, 4 MG/3ML SOPN Reorder     Meds ordered this encounter  Medications   Semaglutide, 1 MG/DOSE, (OZEMPIC, 1 MG/DOSE,) 4 MG/3ML SOPN    Sig: Inject 1 mg into the skin once a week.    Dispense:  3 mL    Refill:  0    Order Specific Question:   Supervising Provider    Answer:   Glennis Brink [[id number]]      Objective:   VITALS: Per patient if applicable, see vitals. GENERAL: Alert and in no acute  distress. CARDIOPULMONARY: No increased WOB. Speaking in clear sentences.  PSYCH: Pleasant and cooperative. Speech normal rate and rhythm. Affect is appropriate. Insight and judgement are appropriate. Attention is focused, linear, and appropriate.  NEURO: Oriented as arrived to appointment on time with no prompting.   Attestation Statements:   Reviewed by clinician on day of visit: allergies, medications, problem list, medical history, surgical history, family history, social history, and previous encounter notes.   This was prepared with the assistance of Engineer, civil (consulting).  Occasional wrong-word or sound-a-like substitutions may have occurred due to the inherent limitations of voice recognition software.

## 2022-10-25 ENCOUNTER — Ambulatory Visit: Payer: Medicaid Other | Admitting: Nurse Practitioner

## 2022-10-25 ENCOUNTER — Encounter: Payer: Self-pay | Admitting: Nurse Practitioner

## 2022-10-25 VITALS — BP 117/82 | HR 89 | Temp 98.2°F | Ht <= 58 in | Wt 179.0 lb

## 2022-10-25 DIAGNOSIS — E1169 Type 2 diabetes mellitus with other specified complication: Secondary | ICD-10-CM

## 2022-10-25 DIAGNOSIS — Z6841 Body Mass Index (BMI) 40.0 and over, adult: Secondary | ICD-10-CM | POA: Diagnosis not present

## 2022-10-25 DIAGNOSIS — Z7985 Long-term (current) use of injectable non-insulin antidiabetic drugs: Secondary | ICD-10-CM

## 2022-10-25 NOTE — Progress Notes (Signed)
Office: [phone]  /  Fax: [phone]  WEIGHT SUMMARY AND BIOMETRICS  Weight Lost Since Last Visit: 2lb  No data recorded  Vitals Temp: 98.2 F (36.8 C) BP: 117/82 Pulse Rate: 89 SpO2: 96 %   Anthropometric Measurements Height: 4\' 7"  (1.397 m) Weight: 179 lb (81.2 kg) BMI (Calculated): 41.6 Weight at Last Visit: 181lb Weight Lost Since Last Visit: 2lb Starting Weight: 186lb Total Weight Loss (lbs): 7 lb (3.175 kg)   No data recorded Other Clinical Data Fasting: No Labs: No Today's Visit #: 19 Starting Date: 05/31/21     HPI  Chief Complaint: OBESITY  Robin Vazquez is here to discuss her progress with her obesity treatment plan. She is on the keeping a food journal and adhering to recommended goals of 1200-1300 calories and 75 protein and states she is following her eating plan approximately 60 % of the time. She states she is exercising 15 minutes 3 days per week.   Interval History:  Since last office visit she has lost 2 pounds since her office visit on 08/15/22.  Hasn't been tracking as she has in the past.     Pharmacotherapy for weight loss: She is not currently taking medications  for medical weight loss.  Denies side effects.    Previous pharmacotherapy for medical weight loss:  Phentermine  Bariatric surgery:  Has not had bariatric surgery.   Pharmacotherapy for DMT2:  She is currently taking Ozempic 1mg , Farxiga 10mg , glipizide 10mg .  She is struggling with GERD, nausea and diarrhea since January.  Has gotten worse over the past 3 weeks. She had one episode where she vomited. Ginger ale helps. Unsure of what makes it worse.   Last A1c was 6.3 Fasting BS:  95, 98, 110, 126 Episodes of hypoglycemia: no On statin.  Last eye exam:  2023 She has tried Metformin in the past.  She stopped Metformin due to side effects of diarrhea.     Lab Results  Component Value Date   HGBA1C 6.3 (H) 07/10/2022   HGBA1C 7.2 (H) 03/27/2022   HGBA1C 8.3 (H)  12/05/2021   Lab Results  Component Value Date   MICROALBUR <3.0 (H) 08/14/2016   LDLCALC 145 (H) 07/10/2022   CREATININE 0.45 07/10/2022       PHYSICAL EXAM:  Blood pressure 117/82, pulse 89, temperature 98.2 F (36.8 C), height 4\' 7"  (1.397 m), weight 179 lb (81.2 kg), last menstrual period 10/22/2022, SpO2 96 %. Body mass index is 41.6 kg/m.  General: She is overweight, cooperative, alert, well developed, and in no acute distress. PSYCH: Has normal mood, affect and thought process.   Extremities: No edema.  Neurologic: No gross sensory or motor deficits. No tremors or fasciculations noted.    DIAGNOSTIC DATA REVIEWED:  BMET    Component Value Date/Time   NA 134 (L) 07/10/2022 1254   K 3.8 07/10/2022 1254   CL 102 07/10/2022 1254   CO2 19 (L) 07/10/2022 1254   GLUCOSE 142 (H) 07/10/2022 1254   BUN 12 07/10/2022 1254   CREATININE 0.45 07/10/2022 1254   CALCIUM 9.3 07/10/2022 1254   GFRNONAA >60 07/10/2022 1254   GFRAA >60 11/02/2019 1401   Lab Results  Component Value Date   HGBA1C 6.3 (H) 07/10/2022   HGBA1C 6.2 (H) 07/28/2015   No results found for: "INSULIN" Lab Results  Component Value Date   TSH 1.388 07/10/2022   CBC    Component Value Date/Time   WBC 7.3 07/10/2022 1254   RBC 5.79 (  H) 07/10/2022 1254   HGB 14.2 07/10/2022 1254   HCT 45.6 07/10/2022 1254   PLT 413 (H) 07/10/2022 1254   MCV 78.8 (L) 07/10/2022 1254   MCH 24.5 (L) 07/10/2022 1254   MCHC 31.1 07/10/2022 1254   RDW 15.0 07/10/2022 1254   Iron Studies No results found for: "IRON", "TIBC", "FERRITIN", "IRONPCTSAT" Lipid Panel     Component Value Date/Time   CHOL 200 07/10/2022 1254   TRIG 44 07/10/2022 1254   HDL 46 07/10/2022 1254   CHOLHDL 4.3 07/10/2022 1254   VLDL 9 07/10/2022 1254   LDLCALC 145 (H) 07/10/2022 1254   Hepatic Function Panel     Component Value Date/Time   PROT 8.3 (H) 07/10/2022 1254   ALBUMIN 4.3 07/10/2022 1254   AST 20 07/10/2022 1254   ALT 20  07/10/2022 1254   ALKPHOS 58 07/10/2022 1254   BILITOT 0.6 07/10/2022 1254      Component Value Date/Time   TSH 1.388 07/10/2022 1254   Nutritional Lab Results  Component Value Date   VD25OH See Scanned report in [place] [facility] 07/10/2022   VD25OH 53.97 12/05/2021   VD25OH 27.32 (L) 06/08/2021     ASSESSMENT AND PLAN  TREATMENT PLAN FOR OBESITY:  Recommended Dietary Goals  Robin Vazquez is currently in the action stage of change. As such, her goal is to continue weight management plan. She has agreed to track and will review at next visit.  Keep a food journal with goal of 1300-1500 calories and 90+ grams of protein daily. Avoid skipping meals.  Can substitute a protein shake for one meal daily   Behavioral Intervention  We discussed the following Behavioral Modification Strategies today: increasing lean protein intake, decreasing simple carbohydrates , increasing vegetables, increasing lower glycemic fruits, increasing fiber rich foods, avoiding skipping meals, increasing water intake, continue to practice mindfulness when eating, and planning for success.  Additional resources provided today: NA  Recommended Physical Activity Goals  Robin Vazquez has been advised to work up to 150 minutes of moderate intensity aerobic activity a week and strengthening exercises 2-3 times per week for cardiovascular health, weight loss maintenance and preservation of muscle mass.   She has agreed to Continue current level of physical activity  and Think about ways to increase daily physical activity and overcoming barriers to exercise   ASSOCIATED CONDITIONS ADDRESSED TODAY  Action/Plan  Type 2 diabetes mellitus with other specified complication, without long-term current use of insulin (HCC) Will decrease Ozempic to 0.5mg  for the next two weeks and contact me on mychart in 2 weeks to let me know how she is doing and with FBS readings.  Will adjust Ozempic based upon how she is doing.    Morbid  obesity (HCC): Starting BMI 43  BMI 40.0-44.9, adult (HCC)         Return in about 4 weeks (around 11/22/2022).Marland Kitchen She was informed of the importance of frequent follow up visits to maximize her success with intensive lifestyle modifications for her multiple health conditions.   ATTESTASTION STATEMENTS:  Reviewed by clinician on day of visit: allergies, medications, problem list, medical history, surgical history, family history, social history, and previous encounter notes.   Time spent on visit including pre-visit chart review and post-visit care and charting was 30 minutes.    Theodis Sato. Mariska Daffin FNP-C

## 2022-11-06 ENCOUNTER — Encounter: Payer: Self-pay | Admitting: Nurse Practitioner

## 2022-11-07 ENCOUNTER — Other Ambulatory Visit: Payer: Self-pay | Admitting: Nurse Practitioner

## 2022-11-07 DIAGNOSIS — Z13220 Encounter for screening for lipoid disorders: Secondary | ICD-10-CM

## 2022-11-07 DIAGNOSIS — Z131 Encounter for screening for diabetes mellitus: Secondary | ICD-10-CM

## 2022-11-07 DIAGNOSIS — Z1329 Encounter for screening for other suspected endocrine disorder: Secondary | ICD-10-CM

## 2022-11-07 DIAGNOSIS — I10 Essential (primary) hypertension: Secondary | ICD-10-CM

## 2022-11-12 ENCOUNTER — Other Ambulatory Visit: Payer: Self-pay | Admitting: Nurse Practitioner

## 2022-11-16 ENCOUNTER — Other Ambulatory Visit
Admission: RE | Admit: 2022-11-16 | Discharge: 2022-11-16 | Disposition: A | Discharge status: Home / Self Care | Payer: Medicaid Other | Attending: Nurse Practitioner | Admitting: Nurse Practitioner

## 2022-11-16 DIAGNOSIS — Z131 Encounter for screening for diabetes mellitus: Secondary | ICD-10-CM | POA: Insufficient documentation

## 2022-11-16 DIAGNOSIS — I10 Essential (primary) hypertension: Secondary | ICD-10-CM | POA: Insufficient documentation

## 2022-11-16 DIAGNOSIS — Z13220 Encounter for screening for lipoid disorders: Secondary | ICD-10-CM | POA: Insufficient documentation

## 2022-11-16 DIAGNOSIS — Z1329 Encounter for screening for other suspected endocrine disorder: Secondary | ICD-10-CM | POA: Insufficient documentation

## 2022-11-16 LAB — COMPREHENSIVE METABOLIC PANEL
ALT: 16 U/L (ref 0–44)
AST: 18 U/L (ref 15–41)
Albumin: 4 g/dL (ref 3.5–5.0)
Alkaline Phosphatase: 59 U/L (ref 38–126)
Anion gap: 9 (ref 5–15)
BUN: 10 mg/dL (ref 6–20)
CO2: 18 mmol/L — ABNORMAL LOW (ref 22–32)
Calcium: 8.5 mg/dL — ABNORMAL LOW (ref 8.9–10.3)
Chloride: 108 mmol/L (ref 98–111)
Creatinine, Ser: 0.35 mg/dL — ABNORMAL LOW (ref 0.44–1.00)
GFR, Estimated: >60 mL/min (ref >60)
Glucose, Bld: 131 mg/dL — ABNORMAL HIGH (ref 70–99)
Potassium: 3.7 mmol/L (ref 3.5–5.1)
Sodium: 135 mmol/L (ref 135–145)
Total Bilirubin: 0.6 mg/dL (ref 0.3–1.2)
Total Protein: 7.5 g/dL (ref 6.5–8.1)

## 2022-11-16 LAB — HEMOGLOBIN A1C
Hgb A1c MFr Bld: 7.1 % — ABNORMAL HIGH (ref 4.8–5.6)
Mean Plasma Glucose: 157.07 mg/dL

## 2022-11-16 LAB — LIPID PANEL
Cholesterol: 84 mg/dL (ref 0–200)
HDL: 33 mg/dL — ABNORMAL LOW (ref >40)
LDL Cholesterol: 43 mg/dL (ref 0–99)
Total CHOL/HDL Ratio: 2.5 RATIO
Triglycerides: 41 mg/dL (ref <150)
VLDL: 8 mg/dL (ref 0–40)

## 2022-11-16 LAB — TSH: TSH: 1.303 u[IU]/mL (ref 0.350–4.500)

## 2022-11-20 ENCOUNTER — Other Ambulatory Visit (INDEPENDENT_AMBULATORY_CARE_PROVIDER_SITE_OTHER): Payer: Self-pay | Admitting: Family Medicine

## 2022-11-20 DIAGNOSIS — E1169 Type 2 diabetes mellitus with other specified complication: Secondary | ICD-10-CM

## 2022-11-21 NOTE — Progress Notes (Unsigned)
TeleHealth Visit:  This visit was completed with telemedicine (audio/video) technology. Robin Vazquez has verbally consented to this TeleHealth visit. The patient is located at home, the provider is located at home. The participants in this visit include the listed provider and patient. The visit was conducted today via MyChart video.  OBESITY Robin Vazquez is here to discuss her progress with her obesity treatment plan along with follow-up of her obesity related diagnoses.   Today's visit was # 20 Starting weight: 186 lbs Starting date: 05/31/21 Weight at last in office visit: 179 lbs on 10/25/22 Total weight loss: 7 lbs at last in office visit on 10/25/22. Today's reported weight (11/22/22): none reported  Nutrition Plan: keeping a food journal with goal of 1300-1500 calories and 90 grams of protein daily -  Current exercise: walking 15 minutes 3 days per week   Interim History:  She is now using My Fitness Pal to track calories.  Reports she is tracking consistently-about 5 days/week. She is meeting her protein and calorie goals. Denies eating much junk food other than cookies occasionally. Denies intake of sugar sweetened beverages. She was surprised that her recent A1c had increased.  Food recall: Breakfast - oatmeal and honey or eggs and sausage. Lunch - Robin Vazquez burger without bun or Robin Vazquez sandwich Robin Vazquez Robin Vazquez bread). She cooks dinner for herself- salmon, wings. Broccoli, spinach carrots.  Assessment/Plan:  1. Type 2 Diabetes Mellitus with hyperglycemia, without long-term current use of insulin HgbA1c is not at goal. Last A1c was 7.1, up from 6.3 in Feb 2024. DM managed by PCP. CBGs: Fasting 65-150. Lows are rare. Medication(s): Ozempic 1 mg SQ weekly- no side effects. Also taking Farxiga 10 mg daily and glipizide 10 mg before breakfast. She had unpleasant burps with higher dose of Ozempic.   Lab Results  Component Value Date   HGBA1C 7.1 (H) 11/16/2022   HGBA1C  6.3 (H) 07/10/2022   HGBA1C 7.2 (H) 03/27/2022   Lab Results  Component Value Date   MICROALBUR <3.0 (H) 08/14/2016   LDLCALC 43 11/16/2022   CREATININE 0.35 (L) 11/16/2022   No results found for: "GFR"  Plan: Continue and refill Ozempic 1 mg SQ weekly. Continue glipizide 10 mg before breakfast. Continue Farxiga 10 mg daily. Check CBG 1-2 hours after lunch or dinner, record, and bring to next visit. Consider switching to [facility] to see if she can tolerate higher dose to assist with weight loss.  2.  Mixed diabetic hyperlipidemia associated with type 2 diabetes  Hyperlipidemia LDL is at goal (43 on 11/16/2022).  HDL low at 33.  Triglycerides normal at 41. Medication(s): Crestor 20 mg  Lab Results  Component Value Date   CHOL 84 11/16/2022   HDL 33 (L) 11/16/2022   LDLCALC 43 11/16/2022   TRIG 41 11/16/2022   CHOLHDL 2.5 11/16/2022   CHOLHDL 4.3 07/10/2022   CHOLHDL 4.0 03/27/2022   Lab Results  Component Value Date   ALT 16 11/16/2022   AST 18 11/16/2022   ALKPHOS 59 11/16/2022   BILITOT 0.6 11/16/2022   The ASCVD Risk score (Arnett DK, et al., 2019) failed to calculate for the following reasons:   The 2019 ASCVD risk score is only valid for ages 65 to 60  Plan: Continue Crestor 20 mg daily  3. Morbid Obesity: Current BMI 41 Robin Vazquez is currently in the action stage of change. As such, her goal is to continue with weight loss efforts.  She has agreed to keeping a food journal with goal of 1300-1500  calories and 90 grams of protein daily.  1.  Try the lose it app for the free barcode scanner.  Exercise goals: Walking day 5 days a week.  Behavioral modification strategies: increasing lean protein intake, decreasing simple carbohydrates , planning for success, keep a strict food journal, and increase frequency of journaling.  Robin Vazquez has agreed to follow-up with our clinic in 4 weeks.  No orders of the defined types were placed in this  encounter.   Medications Discontinued During This Encounter  Medication Reason   Semaglutide, 1 MG/DOSE, (OZEMPIC, 1 MG/DOSE,) 4 MG/3ML SOPN Reorder     Meds ordered this encounter  Medications   Semaglutide, 1 MG/DOSE, (OZEMPIC, 1 MG/DOSE,) 4 MG/3ML SOPN    Sig: Inject 1 mg into the skin once a week.    Dispense:  3 mL    Refill:  0    Order Specific Question:   Supervising Provider    Answer:   Glennis Brink [[id number]]      Objective:   VITALS: Per patient if applicable, see vitals. GENERAL: Alert and in no acute distress. CARDIOPULMONARY: No increased WOB. Speaking in clear sentences.  PSYCH: Pleasant and cooperative. Speech normal rate and rhythm. Affect is appropriate. Insight and judgement are appropriate. Attention is focused, linear, and appropriate.  NEURO: Oriented as arrived to appointment on time with no prompting.   Attestation Statements:   Reviewed by clinician on day of visit: allergies, medications, problem list, medical history, surgical history, family history, social history, and previous encounter notes.   This was prepared with the assistance of Engineer, civil (consulting).  Occasional wrong-word or sound-a-like substitutions may have occurred due to the inherent limitations of voice recognition software.

## 2022-11-22 ENCOUNTER — Telehealth (INDEPENDENT_AMBULATORY_CARE_PROVIDER_SITE_OTHER): Payer: Medicaid Other | Admitting: Family Medicine

## 2022-11-22 ENCOUNTER — Encounter (INDEPENDENT_AMBULATORY_CARE_PROVIDER_SITE_OTHER): Payer: Self-pay | Admitting: Family Medicine

## 2022-11-22 DIAGNOSIS — E1165 Type 2 diabetes mellitus with hyperglycemia: Secondary | ICD-10-CM

## 2022-11-22 DIAGNOSIS — Z7985 Long-term (current) use of injectable non-insulin antidiabetic drugs: Secondary | ICD-10-CM

## 2022-11-22 DIAGNOSIS — E782 Mixed hyperlipidemia: Secondary | ICD-10-CM

## 2022-11-22 DIAGNOSIS — Z6841 Body Mass Index (BMI) 40.0 and over, adult: Secondary | ICD-10-CM

## 2022-11-22 DIAGNOSIS — E1169 Type 2 diabetes mellitus with other specified complication: Secondary | ICD-10-CM

## 2022-11-22 MED ORDER — OZEMPIC (1 MG/DOSE) 4 MG/3ML ~~LOC~~ SOPN: 1.0000 mg | PEN_INJECTOR | SUBCUTANEOUS | 0 refills | Status: DC

## 2022-11-27 ENCOUNTER — Other Ambulatory Visit: Payer: Self-pay | Admitting: Internal Medicine

## 2022-11-27 ENCOUNTER — Encounter: Payer: Self-pay | Admitting: Internal Medicine

## 2022-11-27 ENCOUNTER — Ambulatory Visit: Payer: Medicaid Other | Admitting: Internal Medicine

## 2022-11-27 ENCOUNTER — Ambulatory Visit (INDEPENDENT_AMBULATORY_CARE_PROVIDER_SITE_OTHER): Payer: Medicaid Other

## 2022-11-27 VITALS — BP 129/80 | HR 96 | Ht <= 58 in | Wt 182.0 lb

## 2022-11-27 DIAGNOSIS — M25561 Pain in right knee: Secondary | ICD-10-CM

## 2022-11-27 DIAGNOSIS — M25462 Effusion, left knee: Secondary | ICD-10-CM

## 2022-11-27 DIAGNOSIS — M25562 Pain in left knee: Secondary | ICD-10-CM

## 2022-11-27 DIAGNOSIS — E119 Type 2 diabetes mellitus without complications: Secondary | ICD-10-CM | POA: Diagnosis not present

## 2022-11-27 DIAGNOSIS — I10 Essential (primary) hypertension: Secondary | ICD-10-CM

## 2022-11-27 DIAGNOSIS — E782 Mixed hyperlipidemia: Secondary | ICD-10-CM | POA: Diagnosis not present

## 2022-11-27 DIAGNOSIS — Q688 Other specified congenital musculoskeletal deformities: Secondary | ICD-10-CM

## 2022-11-27 LAB — POCT CBG (FASTING - GLUCOSE)-MANUAL ENTRY: Glucose Fasting, POC: 221 mg/dL — AB (ref 70–99)

## 2022-11-27 MED ORDER — GLIPIZIDE 10 MG PO TABS: ORAL_TABLET | ORAL | 11 refills | Status: DC

## 2022-11-27 NOTE — Progress Notes (Signed)
Established Patient Office Visit  Subjective:  Patient ID: Robin Vazquez, female    DOB: [birth year]  Age: 37 y.o. MRN: [id number]  Chief Complaint  Patient presents with   Follow-up    4 mo F/U    Patient comes in for follow-up and to discuss her lab results.  Her hemoglobin A1c has gone up a little.  Currently patient is on Ozempic as well as Glucotrol once a day.  She was not able to tolerate metformin in the past.  Agrees to increase her dose of Glucotrol to 10 mg in the morning and 5 mg at night.  Also to increase her diet control.  Patient reports of pain in her left knee anteriorly.  Will check x-ray of the knee.  She has meloxicam at home which she will start taking with food.    No other concerns at this time.   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    Diabetes mellitus without complication (HCC)    Herpes    genitial   History of uterine fibroid 09/2015   Hyperlipemia    Hypertension    Obesity    Other fatigue     Past Surgical History:  Procedure Laterality Date   joint     on legs and feet   MYOMECTOMY ABDOMINAL APPROACH  10/18/2017   performed at [facility]   UTERINE FIBROID SURGERY      Social History   Socioeconomic History   Marital status: Single    Spouse name: Not on file   Number of children: Not on file   Years of education: Not on file   Highest education level: Not on file  Occupational History   Occupation: Disabled    Employer: Not employed  Tobacco Use   Smoking status: Never   Smokeless tobacco: Never  Vaping Use   Vaping Use: Never used  Substance and Sexual Activity   Alcohol use: No   Drug use: No   Sexual activity: Not Currently    Birth control/protection: None  Other Topics Concern   Not on file  Social History Narrative   Not on file   Social Determinants of Health    Financial Resource Strain: Not on file  Food Insecurity: Not on file  Transportation Needs: Not on file  Physical Activity: Not on file  Stress: Not on file  Social Connections: Not on file  Intimate Partner Violence: Not on file    Family History  Adopted: Yes  Family history unknown: Yes    No Known Allergies  Review of Systems  Constitutional: Negative.   HENT: Negative.  Negative for ear discharge and sore throat.   Eyes: Negative.   Respiratory: Negative.  Negative for cough and shortness of breath.   Cardiovascular: Negative.  Negative for chest pain, palpitations and leg swelling.  Gastrointestinal: Negative.  Negative for abdominal pain, constipation, diarrhea, heartburn, nausea and vomiting.  Genitourinary: Negative.  Negative for dysuria and flank pain.  Musculoskeletal:  Positive for joint pain. Negative for myalgias.  Skin: Negative.   Neurological: Negative.  Negative for dizziness and headaches.  Endo/Heme/Allergies: Negative.   Psychiatric/Behavioral: Negative.  Negative for depression and suicidal ideas. The patient is not nervous/anxious.        Objective:   BP 129/80   Pulse 96   Ht 4\' 7"  (1.397 m)  Wt 182 lb (82.6 kg)   SpO2 99%   BMI 42.30 kg/m   Vitals:   11/27/22 1344  BP: 129/80  Pulse: 96  Height: 4\' 7"  (1.397 m)  Weight: 182 lb (82.6 kg)  SpO2: 99%  BMI (Calculated): 42.3    Physical Exam Vitals and nursing note reviewed.  Constitutional:      Appearance: Normal appearance.  HENT:     Head: Normocephalic and atraumatic.     Nose: Nose normal.     Mouth/Throat:     Mouth: Mucous membranes are moist.     Pharynx: Oropharynx is clear.  Eyes:     Conjunctiva/sclera: Conjunctivae normal.     Pupils: Pupils are equal, round, and reactive to light.  Cardiovascular:     Rate and Rhythm: Normal rate and regular rhythm.     Pulses: Normal pulses.     Heart sounds: Normal heart sounds. No murmur heard. Pulmonary:     Effort:  Pulmonary effort is normal.     Breath sounds: Normal breath sounds. No wheezing.  Abdominal:     General: Bowel sounds are normal.     Palpations: Abdomen is soft.     Tenderness: There is no abdominal tenderness. There is no right CVA tenderness or left CVA tenderness.  Musculoskeletal:        General: Tenderness present. Normal range of motion.     Cervical back: Normal range of motion.     Right lower leg: No edema.     Left lower leg: No edema.  Skin:    General: Skin is warm and dry.  Neurological:     General: No focal deficit present.     Mental Status: She is alert and oriented to person, place, and time.  Psychiatric:        Mood and Affect: Mood normal.        Behavior: Behavior normal.      Results for orders placed or performed in visit on 11/27/22  POCT CBG (Fasting - Glucose)  Result Value Ref Range   Glucose Fasting, POC 221 (A) 70 - 99 mg/dL    Recent Results (from the past 2160 hour(s))  Lipid panel     Status: Abnormal   Collection Time: 11/16/22  1:03 PM  Result Value Ref Range   Cholesterol 84 0 - 200 mg/dL   Triglycerides 41 <045 mg/dL   HDL 33 (L) >40 mg/dL   Total CHOL/HDL Ratio 2.5 RATIO   VLDL 8 0 - 40 mg/dL   LDL Cholesterol 43 0 - 99 mg/dL    Comment:        Total Cholesterol/HDL:CHD Risk Coronary Heart Disease Risk Table                     Men   Women  1/2 Average Risk   3.4   3.3  Average Risk       5.0   4.4  2 X Average Risk   9.6   7.1  3 X Average Risk  23.4   11.0        Use the calculated Patient Ratio above and the CHD Risk Table to determine the patient's CHD Risk.        ATP III CLASSIFICATION (LDL):  <100     mg/dL   Optimal  981-191  mg/dL   Near or Above  Optimal  130-159  mg/dL   Borderline  017-510  mg/dL   High  >258     mg/dL   Very High Performed at [facility], [address] [place], [place] [address]   Comprehensive metabolic panel     Status: Abnormal   Collection Time:  11/16/22  1:03 PM  Result Value Ref Range   Sodium 135 135 - 145 mmol/L   Potassium 3.7 3.5 - 5.1 mmol/L   Chloride 108 98 - 111 mmol/L   CO2 18 (L) 22 - 32 mmol/L   Glucose, Bld 131 (H) 70 - 99 mg/dL    Comment: Glucose reference range applies only to samples taken after fasting for at least 8 hours.   BUN 10 6 - 20 mg/dL   Creatinine, Ser 2.42 (L) 0.44 - 1.00 mg/dL   Calcium 8.5 (L) 8.9 - 10.3 mg/dL   Total Protein 7.5 6.5 - 8.1 g/dL   Albumin 4.0 3.5 - 5.0 g/dL   AST 18 15 - 41 U/L   ALT 16 0 - 44 U/L   Alkaline Phosphatase 59 38 - 126 U/L   Total Bilirubin 0.6 0.3 - 1.2 mg/dL   GFR, Estimated >35 >36 mL/min    Comment: (NOTE) Calculated using the CKD-EPI Creatinine Equation (2021)    Anion gap 9 5 - 15    Comment: Performed at [facility], [address] [place], [place] [address]  TSH     Status: None   Collection Time: 11/16/22  1:03 PM  Result Value Ref Range   TSH 1.303 0.350 - 4.500 uIU/mL    Comment: Performed by a 3rd Generation assay with a functional sensitivity of <=0.01 uIU/mL. Performed at [facility], [address] [place], [place] [address]   Hemoglobin A1c     Status: Abnormal   Collection Time: 11/16/22  1:03 PM  Result Value Ref Range   Hgb A1c MFr Bld 7.1 (H) 4.8 - 5.6 %    Comment: (NOTE) Pre diabetes:          5.7%-6.4%  Diabetes:              >6.4%  Glycemic control for   <7.0% adults with diabetes    Mean Plasma Glucose 157.07 mg/dL    Comment: Performed at [facility], [address], [place], [place] [address]  POCT CBG (Fasting - Glucose)     Status: Abnormal   Collection Time: 11/27/22  1:52 PM  Result Value Ref Range   Glucose Fasting, POC 221 (A) 70 - 99 mg/dL      Assessment & Plan:  Adjust meds.  X-ray of her left knee.  Start taking her meloxicam. Problem List Items Addressed This Visit     Diabetes mellitus (HCC) - Primary   Relevant Medications   glipiZIDE (GLUCOTROL) 10 MG tablet   Other  Relevant Orders   POCT CBG (Fasting - Glucose) (Completed)   Hyperlipemia   Essential hypertension   Arthrogryposis- congenital anomaly of bone/jts (Chronic)   Other Visit Diagnoses     Acute pain of left knee       Relevant Orders   DG KNEE 3 VIEW LEFT   Pain and swelling of left knee           Return in about 2 weeks (around 12/11/2022).   Total time spent: 25 minutes  Margaretann Loveless, MD  11/27/2022   This document may have been prepared by [employer] Voice Recognition  software and as such may include unintentional dictation errors.

## 2022-12-13 ENCOUNTER — Ambulatory Visit: Payer: Medicaid Other | Admitting: Internal Medicine

## 2022-12-19 ENCOUNTER — Encounter: Payer: Self-pay | Admitting: Nurse Practitioner

## 2022-12-19 ENCOUNTER — Ambulatory Visit: Payer: Medicaid Other | Admitting: Nurse Practitioner

## 2022-12-19 ENCOUNTER — Other Ambulatory Visit: Payer: Self-pay | Admitting: Nurse Practitioner

## 2022-12-19 VITALS — BP 127/86 | HR 88 | Temp 98.2°F | Ht <= 58 in | Wt 178.0 lb

## 2022-12-19 DIAGNOSIS — Z6841 Body Mass Index (BMI) 40.0 and over, adult: Secondary | ICD-10-CM

## 2022-12-19 DIAGNOSIS — Z7985 Long-term (current) use of injectable non-insulin antidiabetic drugs: Secondary | ICD-10-CM

## 2022-12-19 DIAGNOSIS — E1165 Type 2 diabetes mellitus with hyperglycemia: Secondary | ICD-10-CM

## 2022-12-19 NOTE — Patient Instructions (Signed)

## 2022-12-19 NOTE — Progress Notes (Signed)
Office: [phone]  /  Fax: [phone]  WEIGHT SUMMARY AND BIOMETRICS  Weight Lost Since Last Visit: 1lb  Weight Gained Since Last Visit: 0lb   Vitals Temp: 98.2 F (36.8 C) BP: 127/86 Pulse Rate: 88 SpO2: 99 %   Anthropometric Measurements Height: 4\' 7"  (1.397 m) Weight: 178 lb (80.7 kg) BMI (Calculated): 41.37 Weight at Last Visit: 179lb Weight Lost Since Last Visit: 1lb Weight Gained Since Last Visit: 0lb Starting Weight: 186lb Total Weight Loss (lbs): 8 lb (3.629 kg)   No data recorded Other Clinical Data Fasting: No Labs: No Today's Visit #: 20 Starting Date: 05/31/21     HPI  Chief Complaint: OBESITY  Robin Vazquez is here to discuss her progress with her obesity treatment plan. She is on the keeping a food journal and adhering to recommended goals of 1300-1500 calories and 90 protein and states she is following her eating plan approximately 60 % of the time. She states she is exercising 15 minutes 3 days per week.   Interval History:  Since last office visit she lost 1 pound.  She celebrated her birthday since her last visit  She is going on a cruise in 2 weeks.    Pharmacotherapy for weight loss: She is not currently taking medications  for medical weight loss.    Previous pharmacotherapy for medical weight loss:  Phentermine  Bariatric surgery:  Patient has not had bariatric surgery.   Pharmacotherapy for DMT2:  She is currently taking Ozempic 1mg , Farxiga 10mg  and glipizide 10mg  one daily.  She reports side effects of GERD, nausea and diarrhea.  Unable to increase ozempic dose due to side effects. Saw PCP recently and her glipizide was increased to 1 am and 1/2 pm-she hasn't started the PM dose.  She is requesting to stop Ozempic due to side effects.    Last A1c was 7.1-worsening CBGs: Fasting   104-137 Episodes of hypoglycemia: no On statin.  Last eye exam:  2023 She has tried Metformin in the past. She stopped Metformin due to side effects  of diarrhea.   Lab Results  Component Value Date   HGBA1C 7.1 (H) 11/16/2022   HGBA1C 6.3 (H) 07/10/2022   HGBA1C 7.2 (H) 03/27/2022   Lab Results  Component Value Date   MICROALBUR <3.0 (H) 08/14/2016   LDLCALC 43 11/16/2022   CREATININE 0.35 (L) 11/16/2022     PHYSICAL EXAM:  Blood pressure 127/86, pulse 88, temperature 98.2 F (36.8 C), height 4\' 7"  (1.397 m), weight 178 lb (80.7 kg), last menstrual period 11/20/2022, SpO2 99%. Body mass index is 41.37 kg/m.  General: She is overweight, cooperative, alert, well developed, and in no acute distress. PSYCH: Has normal mood, affect and thought process.   Extremities: No edema.  Neurologic: No gross sensory or motor deficits. No tremors or fasciculations noted.    DIAGNOSTIC DATA REVIEWED:  BMET    Component Value Date/Time   NA 135 11/16/2022 1303   K 3.7 11/16/2022 1303   CL 108 11/16/2022 1303   CO2 18 (L) 11/16/2022 1303   GLUCOSE 131 (H) 11/16/2022 1303   BUN 10 11/16/2022 1303   CREATININE 0.35 (L) 11/16/2022 1303   CALCIUM 8.5 (L) 11/16/2022 1303   GFRNONAA >60 11/16/2022 1303   GFRAA >60 11/02/2019 1401   Lab Results  Component Value Date   HGBA1C 7.1 (H) 11/16/2022   HGBA1C 6.2 (H) 07/28/2015   No results found for: "INSULIN" Lab Results  Component Value Date   TSH 1.303 11/16/2022  CBC    Component Value Date/Time   WBC 7.3 07/10/2022 1254   RBC 5.79 (H) 07/10/2022 1254   HGB 14.2 07/10/2022 1254   HCT 45.6 07/10/2022 1254   PLT 413 (H) 07/10/2022 1254   MCV 78.8 (L) 07/10/2022 1254   MCH 24.5 (L) 07/10/2022 1254   MCHC 31.1 07/10/2022 1254   RDW 15.0 07/10/2022 1254   Iron Studies No results found for: "IRON", "TIBC", "FERRITIN", "IRONPCTSAT" Lipid Panel     Component Value Date/Time   CHOL 84 11/16/2022 1303   TRIG 41 11/16/2022 1303   HDL 33 (L) 11/16/2022 1303   CHOLHDL 2.5 11/16/2022 1303   VLDL 8 11/16/2022 1303   LDLCALC 43 11/16/2022 1303   Hepatic Function Panel      Component Value Date/Time   PROT 7.5 11/16/2022 1303   ALBUMIN 4.0 11/16/2022 1303   AST 18 11/16/2022 1303   ALT 16 11/16/2022 1303   ALKPHOS 59 11/16/2022 1303   BILITOT 0.6 11/16/2022 1303      Component Value Date/Time   TSH 1.303 11/16/2022 1303   Nutritional Lab Results  Component Value Date   VD25OH See Scanned report in [facility] 07/10/2022   VD25OH 53.97 12/05/2021   VD25OH 27.32 (L) 06/08/2021     ASSESSMENT AND PLAN  TREATMENT PLAN FOR OBESITY:  Recommended Dietary Goals  Mariaeduarda is currently in the action stage of change. As such, her goal is to continue weight management plan. She has agreed to keeping a food journal and adhering to recommended goals of 1300-1500 calories and 90 protein.  Behavioral Intervention  We discussed the following Behavioral Modification Strategies today: increasing lean protein intake, decreasing simple carbohydrates , increasing vegetables, increasing lower glycemic fruits, avoiding skipping meals, increasing water intake, keeping healthy foods at home, continue to practice mindfulness when eating, and planning for success.  Additional resources provided today: NA  Recommended Physical Activity Goals  Lorena has been advised to work up to 150 minutes of moderate intensity aerobic activity a week and strengthening exercises 2-3 times per week for cardiovascular health, weight loss maintenance and preservation of muscle mass.   She has agreed to Think about ways to increase daily physical activity and overcoming barriers to exercise and Increase physical activity in their day and reduce sedentary time (increase NEAT).    ASSOCIATED CONDITIONS ADDRESSED TODAY  Action/Plan  Type 2 diabetes mellitus with hyperglycemia, without long-term current use of insulin (HCC) Stop Ozempic due to side effects.  Discussed starting Mounjaro or decreasing Ozempic dose.  Patient doesn't want to do either at this time.  She is going on a  cruise soon and doesn't want to start anything prior to going on the cruise . I discussed my concerns of hyperglycemia especially when she is on a cruise.  She verbalizes understanding but doesn't want to start Mounjaro or decrease Ozempic dose until she returns home.  She is willing to start [facility] once she returns.    Morbid obesity (HCC): Starting BMI 43  BMI 40.0-44.9, adult (HCC)         Return in about 2 weeks (around 01/02/2023).Marland Kitchen She was informed of the importance of frequent follow up visits to maximize her success with intensive lifestyle modifications for her multiple health conditions.   ATTESTASTION STATEMENTS:  Reviewed by clinician on day of visit: allergies, medications, problem list, medical history, surgical history, family history, social history, and previous encounter notes.   Time spent on visit including pre-visit chart review and  post-visit care and charting was 30 minutes.    Theodis Sato. Taj Arteaga FNP-C

## 2023-01-08 NOTE — Progress Notes (Signed)
TeleHealth Visit:  This visit was completed with telemedicine (audio/video) technology. Robin Vazquez has verbally consented to this TeleHealth visit. The patient is located at home, the provider is located at home. The participants in this visit include the listed provider and patient. The visit was conducted today via MyChart video.  OBESITY Robin Vazquez is here to discuss her progress with her obesity treatment plan along with follow-up of her obesity related diagnoses.   Today's visit was # 21 Starting weight: 186 lbs Starting date: 05/31/21 Weight at last in office visit: 178 lbs on 12/19/22 Total weight loss: 8 lbs at last in office visit on 12/19/22. Today's reported weight (01/09/23):  183 lbs  Nutrition Plan: keeping a food journal with goal of 1300-1500 calories and 90 grams of protein daily   Current exercise:  none  Interim History:  She just got back from a cruise to [place] 5 days ago. She says she gained about 3 pounds. She has not yet got back on plan.  When she journals she does not use an app.  She makes notes in her phone but does not add up the calories or protein. Eats 3 meals per day. Generally has 1 cup of fruit juice daily.  Has not been exercising due to knee pain.  Plans to follow-up with PCP regarding this.  Typical day of food: Breakfast: protein shake Lunch: grilled chicken strips, broccoli Dinner: salmon, mashed potatoes, broccoli Snacks: peanuts, Smart popcorn  Assessment/Plan:  1. Type 2 Diabetes Mellitus with hyperglycemia, without long-term current use of insulin HgbA1c is not at goal. Last A1c was 7.1 on 11/16/2022. CBGs: Fasting 90-211 .  Only checks fasting CBGs. Episodes of hypoglycemia: no Medication(s): Glipizide 5 mg twice daily, Farxiga 10 mg daily. Stopped Ozempic almost 3 weeks ago due to side effects of nausea and "sulfur burps".  Robin Vazquez denies personal or family history of thyroid cancer, history of pancreatitis, or current  cholelithiasis. Robin Vazquez was informed of the most common side effects (nausea, constipation, diarrhea).  Lab Results  Component Value Date   HGBA1C 7.1 (H) 11/16/2022   HGBA1C 6.3 (H) 07/10/2022   HGBA1C 7.2 (H) 03/27/2022   Lab Results  Component Value Date   MICROALBUR <3.0 (H) 08/14/2016   LDLCALC 43 11/16/2022   CREATININE 0.35 (L) 11/16/2022   No results found for: "GFR"  Plan: Start Mounjaro 5.0 mg SQ weekly Continue glipizide 5 mg twice daily and Farxiga 10 mg daily. Try diet juice rather than regular fruit juice.  2.  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  Most recent lipid profile 11/16/2022: LDL at goal-43, HDL low at 33, triglycerides normal at 41. Medication(s): Crestor 20 mg daily Cardiovascular risk factors: diabetes mellitus, dyslipidemia, hypertension, obesity (BMI >= 30 kg/m2), and sedentary lifestyle  Lab Results  Component Value Date   CHOL 84 11/16/2022   HDL 33 (L) 11/16/2022   LDLCALC 43 11/16/2022   TRIG 41 11/16/2022   CHOLHDL 2.5 11/16/2022   CHOLHDL 4.3 07/10/2022   CHOLHDL 4.0 03/27/2022   Lab Results  Component Value Date   ALT 16 11/16/2022   AST 18 11/16/2022   ALKPHOS 59 11/16/2022   BILITOT 0.6 11/16/2022   The ASCVD Risk score (Arnett DK, et al., 2019) failed to calculate for the following reasons:   The 2019 ASCVD risk score is only valid for ages 68 to 64  Plan: Continue Crestor 20 mg daily.   3. Morbid Obesity: Current BMI 41  Robin Vazquez is  currently in the action stage of change. As such, her goal is to continue with weight loss efforts.  She has agreed to keeping a food journal with goal of 1300-1500 calories and 90 grams of protein daily.  1.  Encouraged her to journal using an app like lose it, My Fitness Pal, My Net Diary. 2.  Try diet juice rather than full sugar fruit juice.  Exercise goals: No exercise has been prescribed at this time.  Behavioral modification strategies:  increasing lean protein intake, decreasing simple carbohydrates , meal planning , decrease liquid calories, planning for success, keep a strict food journal, and increase frequency of journaling.  Robin Vazquez has agreed to follow-up with our clinic in 4 weeks.  No orders of the defined types were placed in this encounter.   There are no discontinued medications.   Meds ordered this encounter  Medications   tirzepatide (MOUNJARO) 5 MG/0.5ML Pen    Sig: Inject 5 mg into the skin once a week.    Dispense:  2 mL    Refill:  0    Order Specific Question:   Supervising Provider    Answer:   Robin Vazquez [[id number]]      Objective:   VITALS: Per patient if applicable, see vitals. GENERAL: Alert and in no acute distress. CARDIOPULMONARY: No increased WOB. Speaking in clear sentences.  PSYCH: Pleasant and cooperative. Speech normal rate and rhythm. Affect is appropriate. Insight and judgement are appropriate. Attention is focused, linear, and appropriate.  NEURO: Oriented as arrived to appointment on time with no prompting.   Attestation Statements:   Reviewed by clinician on day of visit: allergies, medications, problem list, medical history, surgical history, family history, social history, and previous encounter notes.   This was prepared with the assistance of Engineer, civil (consulting).  Occasional wrong-word or sound-a-like substitutions may have occurred due to the inherent limitations of voice recognition software.

## 2023-01-09 ENCOUNTER — Telehealth: Payer: Self-pay

## 2023-01-09 ENCOUNTER — Encounter (INDEPENDENT_AMBULATORY_CARE_PROVIDER_SITE_OTHER): Payer: Self-pay | Admitting: Family Medicine

## 2023-01-09 ENCOUNTER — Telehealth (INDEPENDENT_AMBULATORY_CARE_PROVIDER_SITE_OTHER): Payer: Medicaid Other | Admitting: Family Medicine

## 2023-01-09 DIAGNOSIS — E1169 Type 2 diabetes mellitus with other specified complication: Secondary | ICD-10-CM | POA: Diagnosis not present

## 2023-01-09 DIAGNOSIS — E1165 Type 2 diabetes mellitus with hyperglycemia: Secondary | ICD-10-CM | POA: Diagnosis not present

## 2023-01-09 DIAGNOSIS — E782 Mixed hyperlipidemia: Secondary | ICD-10-CM | POA: Diagnosis not present

## 2023-01-09 DIAGNOSIS — Z6841 Body Mass Index (BMI) 40.0 and over, adult: Secondary | ICD-10-CM

## 2023-01-09 DIAGNOSIS — Z7985 Long-term (current) use of injectable non-insulin antidiabetic drugs: Secondary | ICD-10-CM

## 2023-01-09 DIAGNOSIS — Z7984 Long term (current) use of oral hypoglycemic drugs: Secondary | ICD-10-CM

## 2023-01-09 MED ORDER — TIRZEPATIDE 5 MG/0.5ML ~~LOC~~ SOAJ: 5.0000 mg | SUBCUTANEOUS | 0 refills | Status: DC

## 2023-01-09 NOTE — Telephone Encounter (Signed)
PA submitted through Cover My Meds for [facility]. Awaiting insurance determination. Key: [id number]

## 2023-01-10 ENCOUNTER — Encounter (INDEPENDENT_AMBULATORY_CARE_PROVIDER_SITE_OTHER): Payer: Self-pay | Admitting: Family Medicine

## 2023-01-10 NOTE — Telephone Encounter (Signed)
Per Cover My Meds:  We denied your request for:  Mounjaro Inj 5mg /0.5 Policy rules found at Clinical Coverage Policy 9, Outpatient Pharmacy Program guided our decision. Here are the policy requirements your request did not meet: Per your health plan's criteria, this drug is covered if you meet the following: If the request is for a non-preferred drug, you have tried one preferred drug (or your doctor provides clinical reason why you cannot use the drugs): Byetta, Trulicity and Victoza. The information provided does not show that you meet the criteria listed above. Please speak with your doctor about your choices. This decision was made per the [employer] [place] GLP-1 Receptor Agonists and Combinations Guideline. *Please note: Byetta, Trulicity and Victoza have been pre-approved through 01/09/2024.

## 2023-01-24 ENCOUNTER — Other Ambulatory Visit: Payer: Self-pay | Admitting: Cardiology

## 2023-01-24 DIAGNOSIS — E1165 Type 2 diabetes mellitus with hyperglycemia: Secondary | ICD-10-CM

## 2023-02-06 ENCOUNTER — Ambulatory Visit: Payer: Medicaid Other | Admitting: Nurse Practitioner

## 2023-02-11 ENCOUNTER — Telehealth: Payer: Self-pay | Admitting: Internal Medicine

## 2023-02-11 ENCOUNTER — Other Ambulatory Visit: Payer: Self-pay | Admitting: Family

## 2023-02-11 NOTE — Telephone Encounter (Signed)
Patient left VM inquiring about some paperwork she needed completed for accommodations for her apartment. I haven't seen this - do you have it?

## 2023-02-15 ENCOUNTER — Other Ambulatory Visit: Payer: Self-pay | Admitting: Family

## 2023-02-18 ENCOUNTER — Ambulatory Visit: Payer: Medicaid Other | Admitting: Nurse Practitioner

## 2023-02-18 ENCOUNTER — Telehealth: Payer: Self-pay

## 2023-02-18 ENCOUNTER — Encounter: Payer: Self-pay | Admitting: Nurse Practitioner

## 2023-02-18 VITALS — BP 135/86 | HR 81 | Temp 98.3°F | Ht <= 58 in | Wt 182.0 lb

## 2023-02-18 DIAGNOSIS — E1165 Type 2 diabetes mellitus with hyperglycemia: Secondary | ICD-10-CM

## 2023-02-18 DIAGNOSIS — Z7984 Long term (current) use of oral hypoglycemic drugs: Secondary | ICD-10-CM

## 2023-02-18 DIAGNOSIS — Z6841 Body Mass Index (BMI) 40.0 and over, adult: Secondary | ICD-10-CM | POA: Diagnosis not present

## 2023-02-18 MED ORDER — TRULICITY 0.75 MG/0.5ML ~~LOC~~ SOAJ: 0.7500 mg | SUBCUTANEOUS | 0 refills | Status: DC

## 2023-02-18 NOTE — Progress Notes (Signed)
Office: [phone]  /  Fax: [phone]  WEIGHT SUMMARY AND BIOMETRICS  Weight Lost Since Last Visit: 0lb  Weight Gained Since Last Visit: 4lb   Vitals Temp: 98.3 F (36.8 C) BP: 135/86 Pulse Rate: 81 SpO2: 99 %   Anthropometric Measurements Height: 4\' 7"  (1.397 m) Weight: 182 lb (82.6 kg) BMI (Calculated): 42.3 Weight at Last Visit: 178lb Weight Lost Since Last Visit: 0lb Weight Gained Since Last Visit: 4lb Starting Weight: 186lb Total Weight Loss (lbs): 4 lb (1.814 kg)   No data recorded Other Clinical Data Fasting: No Labs: No Today's Visit #: 22 Starting Date: 05/31/21     HPI  Chief Complaint: OBESITY  [name] is here to discuss her progress with her obesity treatment plan. She is on the keeping a food journal and adhering to recommended goals of 1300-1500 calories and 90 protein and states she is following her eating plan approximately 50 % of the time. She states she is exercising 0 minutes 0 days per week.   Interval History:  Since last office visit she has gained 4 pounds.     Pharmacotherapy for weight loss: She is not currently taking medications  for medical weight loss.    Previous pharmacotherapy for medical weight loss:  phentermine   Bariatric surgery:  She has not had bariatric surgery  Pharmacotherapy for DMT2:  She is currently taking [place] 10mg  and glipizide 10mg  am and 1.5 tablets in the evening (supposed to be taking 1/2 tablet in the evening).  Denies side effects.   Last A1c was 7.1 CBGs: 126-289   Episodes of hypoglycemia: no On a statin.  Last eye exam:  2023 She has tried Metformin and most recent Ozempic and stopped both due to side effects    Lab Results  Component Value Date   HGBA1C 7.1 (H) 11/16/2022   HGBA1C 6.3 (H) 07/10/2022   HGBA1C 7.2 (H) 03/27/2022   Lab Results  Component Value Date   MICROALBUR <3.0 (H) 08/14/2016   LDLCALC 43 11/16/2022   CREATININE 0.35 (L) 11/16/2022      PHYSICAL  EXAM:  Blood pressure 135/86, pulse 81, temperature 98.3 F (36.8 C), height 4\' 7"  (1.397 m), weight 182 lb (82.6 kg), last menstrual period 01/28/2023, SpO2 99%. Body mass index is 42.3 kg/m.  General: She is overweight, cooperative, alert, well developed, and in no acute distress. PSYCH: Has normal mood, affect and thought process.   Extremities: No edema.  Neurologic: No gross sensory or motor deficits. No tremors or fasciculations noted.    DIAGNOSTIC DATA REVIEWED:  BMET    Component Value Date/Time   NA 135 11/16/2022 1303   K 3.7 11/16/2022 1303   CL 108 11/16/2022 1303   CO2 18 (L) 11/16/2022 1303   GLUCOSE 131 (H) 11/16/2022 1303   BUN 10 11/16/2022 1303   CREATININE 0.35 (L) 11/16/2022 1303   CALCIUM 8.5 (L) 11/16/2022 1303   GFRNONAA >60 11/16/2022 1303   GFRAA >60 11/02/2019 1401   Lab Results  Component Value Date   HGBA1C 7.1 (H) 11/16/2022   HGBA1C 6.2 (H) 07/28/2015   No results found for: "INSULIN" Lab Results  Component Value Date   TSH 1.303 11/16/2022   CBC    Component Value Date/Time   WBC 7.3 07/10/2022 1254   RBC 5.79 (H) 07/10/2022 1254   HGB 14.2 07/10/2022 1254   HCT 45.6 07/10/2022 1254   PLT 413 (H) 07/10/2022 1254   MCV 78.8 (L) 07/10/2022 1254   MCH  24.5 (L) 07/10/2022 1254   MCHC 31.1 07/10/2022 1254   RDW 15.0 07/10/2022 1254   Iron Studies No results found for: "IRON", "TIBC", "FERRITIN", "IRONPCTSAT" Lipid Panel     Component Value Date/Time   CHOL 84 11/16/2022 1303   TRIG 41 11/16/2022 1303   HDL 33 (L) 11/16/2022 1303   CHOLHDL 2.5 11/16/2022 1303   VLDL 8 11/16/2022 1303   LDLCALC 43 11/16/2022 1303   Hepatic Function Panel     Component Value Date/Time   PROT 7.5 11/16/2022 1303   ALBUMIN 4.0 11/16/2022 1303   AST 18 11/16/2022 1303   ALT 16 11/16/2022 1303   ALKPHOS 59 11/16/2022 1303   BILITOT 0.6 11/16/2022 1303      Component Value Date/Time   TSH 1.303 11/16/2022 1303   Nutritional Lab Results   Component Value Date   VD25OH See Scanned report in [facility] 07/10/2022   VD25OH 53.97 12/05/2021   VD25OH 27.32 (L) 06/08/2021     ASSESSMENT AND PLAN  TREATMENT PLAN FOR OBESITY:  Recommended Dietary Goals  Endia is currently in the action stage of change. As such, her goal is to continue weight management plan. She has agreed to keeping a food journal and adhering to recommended goals of 1300-1500 calories and 90 protein.  Behavioral Intervention  We discussed the following Behavioral Modification Strategies today: increasing lean protein intake, decreasing simple carbohydrates , increasing vegetables, increasing lower glycemic fruits, increasing water intake, work on meal planning and preparation, work on tracking and journaling calories using tracking application, reading food labels , keeping healthy foods at home, continue to practice mindfulness when eating, and planning for success.  Additional resources provided today: NA  Recommended Physical Activity Goals  Jazmyne has been advised to work up to 150 minutes of moderate intensity aerobic activity a week and strengthening exercises 2-3 times per week for cardiovascular health, weight loss maintenance and preservation of muscle mass.   She has agreed to Think about ways to increase daily physical activity and overcoming barriers to exercise and Increase physical activity in their day and reduce sedentary time (increase NEAT).   ASSOCIATED CONDITIONS ADDRESSED TODAY  Action/Plan  Type 2 diabetes mellitus with hyperglycemia, without long-term current use of insulin (HCC) -     Start Trulicity; Inject 0.75 mg into the skin once a week.  Dispense: 2 mL; Refill: 0.  Side effects discussed.   Morbid obesity (HCC): Starting BMI 43  BMI 40.0-44.9, adult (HCC)      Will obtain labs at next visit.   Return in about 4 weeks (around 03/18/2023).Marland Kitchen She was informed of the importance of frequent follow up visits  to maximize her success with intensive lifestyle modifications for her multiple health conditions.   ATTESTASTION STATEMENTS:  Reviewed by clinician on day of visit: allergies, medications, problem list, medical history, surgical history, family history, social history, and previous encounter notes.     Theodis Sato. Mike Berntsen FNP-C

## 2023-02-18 NOTE — Patient Instructions (Signed)

## 2023-02-18 NOTE — Telephone Encounter (Signed)
PA submitted through Cover My Meds for Trulicity. Awaiting insurance determination. Key: Rufina Falco

## 2023-02-19 NOTE — Telephone Encounter (Signed)
Received fax from [facility] that Trulicity is approved through 01/09/24.

## 2023-02-25 ENCOUNTER — Other Ambulatory Visit: Payer: Self-pay | Admitting: Cardiology

## 2023-02-25 DIAGNOSIS — E1165 Type 2 diabetes mellitus with hyperglycemia: Secondary | ICD-10-CM

## 2023-03-01 ENCOUNTER — Telehealth: Payer: Self-pay | Admitting: Internal Medicine

## 2023-03-01 NOTE — Telephone Encounter (Signed)
Patient left VM requesting an abx be sent in for a possible UTI. She is having low back pain and last time she had this it was a UTI. Please advise.

## 2023-03-19 ENCOUNTER — Ambulatory Visit: Payer: Medicaid Other | Admitting: Nurse Practitioner

## 2023-03-19 ENCOUNTER — Encounter: Payer: Self-pay | Admitting: Nurse Practitioner

## 2023-03-19 VITALS — BP 128/88 | HR 83 | Temp 98.2°F | Ht <= 58 in | Wt 184.0 lb

## 2023-03-19 DIAGNOSIS — E782 Mixed hyperlipidemia: Secondary | ICD-10-CM

## 2023-03-19 DIAGNOSIS — E1169 Type 2 diabetes mellitus with other specified complication: Secondary | ICD-10-CM | POA: Diagnosis not present

## 2023-03-19 DIAGNOSIS — Z79899 Other long term (current) drug therapy: Secondary | ICD-10-CM

## 2023-03-19 DIAGNOSIS — Z6841 Body Mass Index (BMI) 40.0 and over, adult: Secondary | ICD-10-CM

## 2023-03-19 DIAGNOSIS — I152 Hypertension secondary to endocrine disorders: Secondary | ICD-10-CM

## 2023-03-19 DIAGNOSIS — E1165 Type 2 diabetes mellitus with hyperglycemia: Secondary | ICD-10-CM

## 2023-03-19 DIAGNOSIS — B379 Candidiasis, unspecified: Secondary | ICD-10-CM | POA: Diagnosis not present

## 2023-03-19 DIAGNOSIS — E1159 Type 2 diabetes mellitus with other circulatory complications: Secondary | ICD-10-CM

## 2023-03-19 DIAGNOSIS — D649 Anemia, unspecified: Secondary | ICD-10-CM

## 2023-03-19 DIAGNOSIS — Z7984 Long term (current) use of oral hypoglycemic drugs: Secondary | ICD-10-CM

## 2023-03-19 DIAGNOSIS — Z7985 Long-term (current) use of injectable non-insulin antidiabetic drugs: Secondary | ICD-10-CM

## 2023-03-19 DIAGNOSIS — E559 Vitamin D deficiency, unspecified: Secondary | ICD-10-CM

## 2023-03-19 MED ORDER — TRULICITY 1.5 MG/0.5ML ~~LOC~~ SOAJ: 1.5000 mg | SUBCUTANEOUS | 0 refills | Status: DC

## 2023-03-19 MED ORDER — NYSTATIN 100000 UNIT/GM EX CREA: 1.0000 | TOPICAL_CREAM | Freq: Two times a day (BID) | CUTANEOUS | 0 refills | Status: AC

## 2023-03-19 NOTE — Progress Notes (Signed)
Office: [phone]  /  Fax: [phone]  WEIGHT SUMMARY AND BIOMETRICS  Weight Lost Since Last Visit: 0  Weight Gained Since Last Visit: 2lb   Vitals Temp: 98.2 F (36.8 C) BP: 128/88 Pulse Rate: 83 SpO2: 96 %   Anthropometric Measurements Height: 4\' 7"  (1.397 m) Weight: 184 lb (83.5 kg) BMI (Calculated): 42.77 Weight at Last Visit: 182lb Weight Lost Since Last Visit: 0 Weight Gained Since Last Visit: 2lb Starting Weight: 186lb Total Weight Loss (lbs): 2 lb (0.907 kg)   No data recorded Other Clinical Data Fasting: no Labs: no Today's Visit #: 23 Starting Date: 05/31/21     HPI  Chief Complaint: OBESITY  Chevy is here to discuss her progress with her obesity treatment plan. She is on the keeping a food journal and adhering to recommended goals of 1300-1500 calories and 90 protein and states she is following her eating plan approximately 50 % of the time. She states she is exercising 10 minutes 3 days per week.   Interval History:  Since last office visit she has gained 2 pounds. She is following IF for the past 2 weeks-2 pm-7 pm. She is skipping breakfast.  She is drinking water and diet juice.     Pharmacotherapy for weight loss: She is not currently taking medications  for medical weight loss.    Previous pharmacotherapy for medical weight loss:  Phentermine  Bariatric surgery:  Patient has not had bariatric surgery.   Pharmacotherapy for DMT2:  She is currently taking [place] 10mg , Trulicity 0.75mg  and glipizide 10mg  am and  15 mg in the evening.  Denies side effects.   Last A1c was 7.1 CBGs: 108-185 Episodes of hypoglycemia: no On statin and ACE.  Last eye exam:  2023 She has tried Metformin in the past.    Lab Results  Component Value Date   HGBA1C 7.1 (H) 11/16/2022   HGBA1C 6.3 (H) 07/10/2022   HGBA1C 7.2 (H) 03/27/2022   Lab Results  Component Value Date   MICROALBUR <3.0 (H) 08/14/2016   LDLCALC 43 11/16/2022   CREATININE  0.35 (L) 11/16/2022   Yeast infection Notes redness around her labia for the past month.  Has used diflucan with little relief.  Used Nystatin in the past and is requesting a refill.  Denies fever, chills, nausea, etc.    Hyperlipidemia Medication(s): Crestor 20mg . Denies side effects.    Lab Results  Component Value Date   CHOL 84 11/16/2022   HDL 33 (L) 11/16/2022   LDLCALC 43 11/16/2022   TRIG 41 11/16/2022   CHOLHDL 2.5 11/16/2022   Lab Results  Component Value Date   ALT 16 11/16/2022   AST 18 11/16/2022   ALKPHOS 59 11/16/2022   BILITOT 0.6 11/16/2022   The ASCVD Risk score (Arnett DK, et al., 2019) failed to calculate for the following reasons:   The 2019 ASCVD risk score is only valid for ages 6 to 38   Hypertension Hypertension stable.  Medication(s): norvasc 5mg , enalapril 5mg .  Denies side effects.  Denies chest pain, palpitations and SOB.  BP Readings from Last 3 Encounters:  03/19/23 128/88  02/18/23 135/86  12/19/22 127/86   Lab Results  Component Value Date   CREATININE 0.35 (L) 11/16/2022   CREATININE 0.45 07/10/2022   CREATININE 0.45 03/27/2022    Vit D deficiency She is not currently taking Vit D or a MVI.      Lab Results  Component Value Date   VD25OH See Scanned report in  [facility] 07/10/2022   VD25OH 53.97 12/05/2021   VD25OH 27.32 (L) 06/08/2021     Anemia Takes iron off and on-not taking on a regular basis.  Not currently taking an iron or MVI.    PHYSICAL EXAM:  Blood pressure 128/88, pulse 83, temperature 98.2 F (36.8 C), height 4\' 7"  (1.397 m), weight 184 lb (83.5 kg), last menstrual period 01/28/2023, SpO2 96%. Body mass index is 42.77 kg/m.  General: She is overweight, cooperative, alert, well developed, and in no acute distress. PSYCH: Has normal mood, affect and thought process.   Extremities: No edema.  Neurologic: No gross sensory or motor deficits. No tremors or fasciculations noted.    DIAGNOSTIC DATA  REVIEWED:  BMET    Component Value Date/Time   NA 135 11/16/2022 1303   K 3.7 11/16/2022 1303   CL 108 11/16/2022 1303   CO2 18 (L) 11/16/2022 1303   GLUCOSE 131 (H) 11/16/2022 1303   BUN 10 11/16/2022 1303   CREATININE 0.35 (L) 11/16/2022 1303   CALCIUM 8.5 (L) 11/16/2022 1303   GFRNONAA >60 11/16/2022 1303   GFRAA >60 11/02/2019 1401   Lab Results  Component Value Date   HGBA1C 7.1 (H) 11/16/2022   HGBA1C 6.2 (H) 07/28/2015   No results found for: "INSULIN" Lab Results  Component Value Date   TSH 1.303 11/16/2022   CBC    Component Value Date/Time   WBC 7.3 07/10/2022 1254   RBC 5.79 (H) 07/10/2022 1254   HGB 14.2 07/10/2022 1254   HCT 45.6 07/10/2022 1254   PLT 413 (H) 07/10/2022 1254   MCV 78.8 (L) 07/10/2022 1254   MCH 24.5 (L) 07/10/2022 1254   MCHC 31.1 07/10/2022 1254   RDW 15.0 07/10/2022 1254   Iron Studies No results found for: "IRON", "TIBC", "FERRITIN", "IRONPCTSAT" Lipid Panel     Component Value Date/Time   CHOL 84 11/16/2022 1303   TRIG 41 11/16/2022 1303   HDL 33 (L) 11/16/2022 1303   CHOLHDL 2.5 11/16/2022 1303   VLDL 8 11/16/2022 1303   LDLCALC 43 11/16/2022 1303   Hepatic Function Panel     Component Value Date/Time   PROT 7.5 11/16/2022 1303   ALBUMIN 4.0 11/16/2022 1303   AST 18 11/16/2022 1303   ALT 16 11/16/2022 1303   ALKPHOS 59 11/16/2022 1303   BILITOT 0.6 11/16/2022 1303      Component Value Date/Time   TSH 1.303 11/16/2022 1303   Nutritional Lab Results  Component Value Date   VD25OH See Scanned report in [facility] 07/10/2022   VD25OH 53.97 12/05/2021   VD25OH 27.32 (L) 06/08/2021     ASSESSMENT AND PLAN  TREATMENT PLAN FOR OBESITY:  Recommended Dietary Goals  Haelyn is currently in the action stage of change. As such, her goal is to continue weight management plan. She has agreed to keeping a food journal and adhering to recommended goals of 1300-1400 calories and 80+ protein.  Behavioral  Intervention  We discussed the following Behavioral Modification Strategies today: increasing lean protein intake to established goals, increasing vegetables, avoiding skipping meals, reading food labels , keeping healthy foods at home, and continue to work on maintaining a reduced calorie state, getting the recommended amount of protein, incorporating whole foods, making healthy choices, staying well hydrated and practicing mindfulness when eating..  Additional resources provided today: NA  Recommended Physical Activity Goals  Lashawnda has been advised to work up to 150 minutes of moderate intensity aerobic activity a week and  strengthening exercises 2-3 times per week for cardiovascular health, weight loss maintenance and preservation of muscle mass.   She has agreed to Think about enjoyable ways to increase daily physical activity and overcoming barriers to exercise and Increase physical activity in their day and reduce sedentary time (increase NEAT).   ASSOCIATED CONDITIONS ADDRESSED TODAY  Action/Plan  Type 2 diabetes mellitus with hyperglycemia, without long-term current use of insulin (HCC) -     Increase Trulicity; Inject 1.5 mg into the skin once a week.  Dispense: 2 mL; Refill: 0. Side effects discussed. -     Hemoglobin A1c -     Comprehensive metabolic panel  Yeast infection -     Nystatin; Apply 1 Application topically 2 (two) times daily.  Dispense: 30 g; Refill: 0.  Side effects discussed.  Needs to follow up with PCP or GYN    Mixed diabetic hyperlipidemia associated with type 2 diabetes mellitus (HCC) -     Lipid Panel With LDL/HDL Ratio  Continue to follow up with PCP.  Continue meds as directed   Hypertension associated with type 2 diabetes mellitus (HCC) Continue to follow up with PCP.  Continue meds as directed  Vitamin D deficiency -     VITAMIN D 25 Hydroxy (Vit-D Deficiency, Fractures)  Anemia, unspecified type -     CBC with Differential/Platelet -      Ferritin  Medication management -     Comprehensive metabolic panel  Morbid obesity (HCC): Starting BMI 43  BMI 40.0-44.9, adult (HCC)         Return in about 4 weeks (around 04/16/2023).Marland Kitchen She was informed of the importance of frequent follow up visits to maximize her success with intensive lifestyle modifications for her multiple health conditions.   ATTESTASTION STATEMENTS:  Reviewed by clinician on day of visit: allergies, medications, problem list, medical history, surgical history, family history, social history, and previous encounter notes.    Theodis Sato. Brian Zeitlin FNP-C

## 2023-03-25 ENCOUNTER — Other Ambulatory Visit: Payer: Self-pay | Admitting: Family

## 2023-03-29 ENCOUNTER — Other Ambulatory Visit: Payer: Self-pay | Admitting: Cardiology

## 2023-03-29 DIAGNOSIS — E1165 Type 2 diabetes mellitus with hyperglycemia: Secondary | ICD-10-CM

## 2023-04-04 ENCOUNTER — Other Ambulatory Visit (HOSPITAL_COMMUNITY)
Admission: RE | Admit: 2023-04-04 | Discharge: 2023-04-04 | Disposition: A | Discharge status: Home / Self Care | Payer: Medicaid Other | Source: Ambulatory Visit | Attending: Nurse Practitioner | Admitting: Nurse Practitioner

## 2023-04-04 DIAGNOSIS — E1165 Type 2 diabetes mellitus with hyperglycemia: Secondary | ICD-10-CM | POA: Diagnosis present

## 2023-04-04 LAB — CBC WITH DIFFERENTIAL/PLATELET
Abs Immature Granulocytes: 0.02 10*3/uL (ref 0.00–0.07)
Basophils Absolute: 0.1 10*3/uL (ref 0.0–0.1)
Basophils Relative: 1 %
Eosinophils Absolute: 0.1 10*3/uL (ref 0.0–0.5)
Eosinophils Relative: 2 %
HCT: 38.3 % (ref 36.0–46.0)
Hemoglobin: 10.8 g/dL — ABNORMAL LOW (ref 12.0–15.0)
Immature Granulocytes: 0 %
Lymphocytes Relative: 39 %
Lymphs Abs: 2.9 10*3/uL (ref 0.7–4.0)
MCH: 20.9 pg — ABNORMAL LOW (ref 26.0–34.0)
MCHC: 28.2 g/dL — ABNORMAL LOW (ref 30.0–36.0)
MCV: 74.1 fL — ABNORMAL LOW (ref 80.0–100.0)
Monocytes Absolute: 0.4 10*3/uL (ref 0.1–1.0)
Monocytes Relative: 6 %
Neutro Abs: 3.9 10*3/uL (ref 1.7–7.7)
Neutrophils Relative %: 52 %
Platelets: 561 10*3/uL — ABNORMAL HIGH (ref 150–400)
RBC: 5.17 MIL/uL — ABNORMAL HIGH (ref 3.87–5.11)
RDW: 18.5 % — ABNORMAL HIGH (ref 11.5–15.5)
WBC: 7.5 10*3/uL (ref 4.0–10.5)
nRBC: 0 % (ref 0.0–0.2)

## 2023-04-04 LAB — COMPREHENSIVE METABOLIC PANEL
ALT: 19 U/L (ref 0–44)
AST: 17 U/L (ref 15–41)
Albumin: 4.1 g/dL (ref 3.5–5.0)
Alkaline Phosphatase: 72 U/L (ref 38–126)
Anion gap: 13 (ref 5–15)
BUN: 12 mg/dL (ref 6–20)
CO2: 20 mmol/L — ABNORMAL LOW (ref 22–32)
Calcium: 8.7 mg/dL — ABNORMAL LOW (ref 8.9–10.3)
Chloride: 102 mmol/L (ref 98–111)
Creatinine, Ser: 0.36 mg/dL — ABNORMAL LOW (ref 0.44–1.00)
GFR, Estimated: >60 mL/min (ref >60)
Glucose, Bld: 152 mg/dL — ABNORMAL HIGH (ref 70–99)
Potassium: 3.6 mmol/L (ref 3.5–5.1)
Sodium: 135 mmol/L (ref 135–145)
Total Bilirubin: 0.4 mg/dL (ref <1.2)
Total Protein: 7.8 g/dL (ref 6.5–8.1)

## 2023-04-04 LAB — LIPID PANEL
Cholesterol: 136 mg/dL (ref 0–200)
HDL: 41 mg/dL (ref >40)
LDL Cholesterol: 87 mg/dL (ref 0–99)
Total CHOL/HDL Ratio: 3.3 {ratio}
Triglycerides: 39 mg/dL (ref <150)
VLDL: 8 mg/dL (ref 0–40)

## 2023-04-04 LAB — FERRITIN: Ferritin: 4 ng/mL — ABNORMAL LOW (ref 11–307)

## 2023-04-05 LAB — VITAMIN D 25 HYDROXY (VIT D DEFICIENCY, FRACTURES): Vit D, 25-Hydroxy: 43.85 ng/mL (ref 30–100)

## 2023-04-05 LAB — HEMOGLOBIN A1C
Hgb A1c MFr Bld: 8.3 % — ABNORMAL HIGH (ref 4.8–5.6)
Mean Plasma Glucose: 191.51 mg/dL

## 2023-04-11 ENCOUNTER — Encounter: Payer: Self-pay | Admitting: Nurse Practitioner

## 2023-04-16 ENCOUNTER — Telehealth (INDEPENDENT_AMBULATORY_CARE_PROVIDER_SITE_OTHER): Payer: Medicaid Other | Admitting: Nurse Practitioner

## 2023-04-16 DIAGNOSIS — E1165 Type 2 diabetes mellitus with hyperglycemia: Secondary | ICD-10-CM

## 2023-04-16 DIAGNOSIS — Z6841 Body Mass Index (BMI) 40.0 and over, adult: Secondary | ICD-10-CM | POA: Diagnosis not present

## 2023-04-16 DIAGNOSIS — Z7985 Long-term (current) use of injectable non-insulin antidiabetic drugs: Secondary | ICD-10-CM

## 2023-04-16 DIAGNOSIS — D649 Anemia, unspecified: Secondary | ICD-10-CM

## 2023-04-16 DIAGNOSIS — Z7984 Long term (current) use of oral hypoglycemic drugs: Secondary | ICD-10-CM

## 2023-04-16 MED ORDER — TRULICITY 3 MG/0.5ML ~~LOC~~ SOAJ: 3.0000 mg | SUBCUTANEOUS | 0 refills | Status: DC

## 2023-04-16 NOTE — Progress Notes (Signed)
Office: [phone]  /  Fax: [phone]  WEIGHT SUMMARY AND BIOMETRICS  TeleHealth Visit:  This visit was completed with telemedicine (audio/video) technology. [name] has verbally consented to this TeleHealth visit. The patient is located at home, the provider is located at [facility]. The participants in this visit include the listed provider and patient. The visit was conducted today via MyChart video.  HPI  Chief Complaint: OBESITY  Robin Vazquez is on video visit to discuss her progress with her obesity treatment plan. She is on the keeping a food journal and adhering to recommended goals of 1300 calories and 90 protein and states she is following her eating plan approximately but unsure of how often she is following it. She states she is exercising 10 minutes 3 days per week.   Interval History:  She reports her weight today was 185 lbs.  She is averaging around 1300 calories and 40-50 grams of protein.    Pharmacotherapy for weight loss: She is not currently taking medications  for medical weight loss.     Previous pharmacotherapy for medical weight loss:  Phentermine   Bariatric surgery:  Patient has not had bariatric surgery.   Pharmacotherapy for DMT2:  She is currently taking [place] 10mg , Trulicity 1.5 mg and glipizide 10mg  am and 5 mg in the evening.  Denies side effects.  Last A1c was 8.3 CBGs: 102-150 Episodes of hypoglycemia: no On  Crestor and enalapril   Last eye exam:  2023 She has tried Metformin and Ozempic in the past.  She stopped Metformin due to side effects of diarrhea.   She stopped Ozempic due to side effects of vomiting and reflux.    Lab Results  Component Value Date   HGBA1C 8.3 (H) 04/04/2023   HGBA1C 7.1 (H) 11/16/2022   HGBA1C 6.3 (H) 07/10/2022   Lab Results  Component Value Date   MICROALBUR <3.0 (H) 08/14/2016   LDLCALC 87 04/04/2023   CREATININE 0.36 (L) 04/04/2023    Anemia Not currently taking iron.  Had myomectomy exc  fibroid tumors on 10/18/17.  She reports "my cycles are not as heavy as they used to be".  She saw oncology in 2012.  Never had iron infusions.  She has appt with GYN on 04/30/23.  Never had a colonoscopy or EGD.    PHYSICAL EXAM:  There were no vitals taken for this visit. There is no height or weight on file to calculate BMI.  General: She is overweight, cooperative, alert, well developed, and in no acute distress. PSYCH: Has normal mood, affect and thought process.   Extremities: No edema.  Neurologic: No gross sensory or motor deficits. No tremors or fasciculations noted.    DIAGNOSTIC DATA REVIEWED:  BMET    Component Value Date/Time   NA 135 04/04/2023 1438   K 3.6 04/04/2023 1438   CL 102 04/04/2023 1438   CO2 20 (L) 04/04/2023 1438   GLUCOSE 152 (H) 04/04/2023 1438   BUN 12 04/04/2023 1438   CREATININE 0.36 (L) 04/04/2023 1438   CALCIUM 8.7 (L) 04/04/2023 1438   GFRNONAA >60 04/04/2023 1438   GFRAA >60 11/02/2019 1401   Lab Results  Component Value Date   HGBA1C 8.3 (H) 04/04/2023   HGBA1C 6.2 (H) 07/28/2015   No results found for: "INSULIN" Lab Results  Component Value Date   TSH 1.303 11/16/2022   CBC    Component Value Date/Time   WBC 7.5 04/04/2023 1438   RBC 5.17 (H) 04/04/2023 1438   HGB 10.8 (  L) 04/04/2023 1438   HCT 38.3 04/04/2023 1438   PLT 561 (H) 04/04/2023 1438   MCV 74.1 (L) 04/04/2023 1438   MCH 20.9 (L) 04/04/2023 1438   MCHC 28.2 (L) 04/04/2023 1438   RDW 18.5 (H) 04/04/2023 1438   Iron Studies    Component Value Date/Time   FERRITIN 4 (L) 04/04/2023 1438   Lipid Panel     Component Value Date/Time   CHOL 136 04/04/2023 1438   TRIG 39 04/04/2023 1438   HDL 41 04/04/2023 1438   CHOLHDL 3.3 04/04/2023 1438   VLDL 8 04/04/2023 1438   LDLCALC 87 04/04/2023 1438   Hepatic Function Panel     Component Value Date/Time   PROT 7.8 04/04/2023 1438   ALBUMIN 4.1 04/04/2023 1438   AST 17 04/04/2023 1438   ALT 19 04/04/2023 1438    ALKPHOS 72 04/04/2023 1438   BILITOT 0.4 04/04/2023 1438      Component Value Date/Time   TSH 1.303 11/16/2022 1303   Nutritional Lab Results  Component Value Date   VD25OH 43.85 04/04/2023   VD25OH See Scanned report in [facility] 07/10/2022   VD25OH 53.97 12/05/2021     ASSESSMENT AND PLAN  TREATMENT PLAN FOR OBESITY:  Recommended Dietary Goals  Anyea is currently in the action stage of change. As such, her goal is to continue weight management plan. She has agreed to keeping a food journal and adhering to recommended goals of 1300-1400 calories and 80+ protein.  Behavioral Intervention  We discussed the following Behavioral Modification Strategies today: continue to work on maintaining a reduced calorie state, getting the recommended amount of protein, incorporating whole foods, making healthy choices, staying well hydrated and practicing mindfulness when eating..  Additional resources provided today: NA  Recommended Physical Activity Goals  Jolanta has been advised to work up to 150 minutes of moderate intensity aerobic activity a week and strengthening exercises 2-3 times per week for cardiovascular health, weight loss maintenance and preservation of muscle mass.   She has agreed to Think about enjoyable ways to increase daily physical activity and overcoming barriers to exercise and Increase physical activity in their day and reduce sedentary time (increase NEAT).    ASSOCIATED CONDITIONS ADDRESSED TODAY  Action/Plan  Type 2 diabetes mellitus with hyperglycemia, without long-term current use of insulin (HCC) -     Increase Trulicity; Inject 3 mg as directed once a week.  Dispense: 2 mL; Refill: 0  Anemia, unspecified type Keep appt with GYN.  Offered to refer to hematology.  Patient would like to discuss with GYN first.  Plans to restart taking iron.    Morbid obesity (HCC): Starting BMI 43      Labs reviewed in chart from 04/04/23   Return in  about 4 weeks (around 05/14/2023).Marland Kitchen She was informed of the importance of frequent follow up visits to maximize her success with intensive lifestyle modifications for her multiple health conditions.   ATTESTASTION STATEMENTS:  Reviewed by clinician on day of visit: allergies, medications, problem list, medical history, surgical history, family history, social history, and previous encounter notes.     Theodis Sato. Pope Brunty FNP-C

## 2023-04-24 ENCOUNTER — Other Ambulatory Visit: Payer: Self-pay | Admitting: Cardiology

## 2023-04-24 DIAGNOSIS — E1165 Type 2 diabetes mellitus with hyperglycemia: Secondary | ICD-10-CM

## 2023-04-29 NOTE — Patient Instructions (Signed)
Preventive Care 21-39 Years Old, Female Preventive care refers to lifestyle choices and visits with your health care provider that can promote health and wellness. Preventive care visits are also called wellness exams. What can I expect for my preventive care visit? Counseling During your preventive care visit, your health care provider may ask about your: Medical history, including: Past medical problems. Family medical history. Pregnancy history. Current health, including: Menstrual cycle. Method of birth control. Emotional well-being. Home life and relationship well-being. Sexual activity and sexual health. Lifestyle, including: Alcohol, nicotine or tobacco, and drug use. Access to firearms. Diet, exercise, and sleep habits. Work and work environment. Sunscreen use. Safety issues such as seatbelt and bike helmet use. Physical exam Your health care provider may check your: Height and weight. These may be used to calculate your BMI (body mass index). BMI is a measurement that tells if you are at a healthy weight. Waist circumference. This measures the distance around your waistline. This measurement also tells if you are at a healthy weight and may help predict your risk of certain diseases, such as type 2 diabetes and high blood pressure. Heart rate and blood pressure. Body temperature. Skin for abnormal spots. What immunizations do I need?  Vaccines are usually given at various ages, according to a schedule. Your health care provider will recommend vaccines for you based on your age, medical history, and lifestyle or other factors, such as travel or where you work. What tests do I need? Screening Your health care provider may recommend screening tests for certain conditions. This may include: Pelvic exam and Pap test. Lipid and cholesterol levels. Diabetes screening. This is done by checking your blood sugar (glucose) after you have not eaten for a while (fasting). Hepatitis  B test. Hepatitis C test. HIV (human immunodeficiency virus) test. STI (sexually transmitted infection) testing, if you are at risk. BRCA-related cancer screening. This may be done if you have a family history of breast, ovarian, tubal, or peritoneal cancers. Talk with your health care provider about your test results, treatment options, and if necessary, the need for more tests. Follow these instructions at home: Eating and drinking  Eat a healthy diet that includes fresh fruits and vegetables, whole grains, lean protein, and low-fat dairy products. Take vitamin and mineral supplements as recommended by your health care provider. Do not drink alcohol if: Your health care provider tells you not to drink. You are pregnant, may be pregnant, or are planning to become pregnant. If you drink alcohol: Limit how much you have to 0-1 drink a day. Know how much alcohol is in your drink. In the [place] one drink equals one 12 oz bottle of beer (355 mL), one 5 oz glass of wine (148 mL), or one 1 oz glass of hard liquor (44 mL). Lifestyle Brush your teeth every morning and night with fluoride toothpaste. Floss one time each day. Exercise for at least 30 minutes 5 or more days each week. Do not use any products that contain nicotine or tobacco. These products include cigarettes, chewing tobacco, and vaping devices, such as e-cigarettes. If you need help quitting, ask your health care provider. Do not use drugs. If you are sexually active, practice safe sex. Use a condom or other form of protection to prevent STIs. If you do not wish to become pregnant, use a form of birth control. If you plan to become pregnant, see your health care provider for a prepregnancy visit. Find healthy ways to manage stress, such as: Meditation,   yoga, or listening to music. Journaling. Talking to a trusted person. Spending time with friends and family. Minimize exposure to UV radiation to reduce your risk of skin  cancer. Safety Always wear your seat belt while driving or riding in a vehicle. Do not drive: If you have been drinking alcohol. Do not ride with someone who has been drinking. If you have been using any mind-altering substances or drugs. While texting. When you are tired or distracted. Wear a helmet and other protective equipment during sports activities. If you have firearms in your house, make sure you follow all gun safety procedures. Seek help if you have been physically or sexually abused. What's next? Go to your health care provider once a year for an annual wellness visit. Ask your health care provider how often you should have your eyes and teeth checked. Stay up to date on all vaccines. This information is not intended to replace advice given to you by your health care provider. Make sure you discuss any questions you have with your health care provider. Document Revised: 11/09/2020 Document Reviewed: 11/09/2020 Elsevier Patient Education  2024 [employer] Breast Self-Awareness Breast self-awareness is knowing how your breasts look and feel. You need to: Check your breasts on a regular basis. Tell your doctor about any changes. Become familiar with the look and feel of your breasts. This can help you catch a breast problem while it is still small and can be treated. You should do breast self-exams even if you have breast implants. What you need: A mirror. A well-lit room. A pillow or other soft object. How to do a breast self-exam Follow these steps to do a breast self-exam: Look for changes  Take off all the clothes above your waist. Stand in front of a mirror in a room with good lighting. Put your hands down at your sides. Compare your breasts in the mirror. Look for any difference between them, such as: A difference in shape. A difference in size. Wrinkles, dips, and bumps in one breast and not the other. Look at each breast for changes in the skin, such  as: Redness. Scaly areas. Skin that has gotten thicker. Dimpling. Open sores (ulcers). Look for changes in your nipples, such as: Fluid coming out of a nipple. Fluid around a nipple. Bleeding. Dimpling. Redness. A nipple that looks pushed in (retracted), or that has changed position. Feel for changes Lie on your back. Feel each breast. To do this: Pick a breast to feel. Place a pillow under the shoulder closest to that breast. Put the arm closest to that breast behind your head. Feel the nipple area of that breast using the hand of your other arm. Feel the area with the pads of your three middle fingers by making small circles with your fingers. Use light, medium, and firm pressure. Continue the overlapping circles, moving downward over the breast. Keep making circles with your fingers. Stop when you feel your ribs. Start making circles with your fingers again, this time going upward until you reach your collarbone. Then, make circles outward across your breast and into your armpit area. Squeeze your nipple. Check for discharge and lumps. Repeat these steps to check your other breast. Sit or stand in the tub or shower. With soapy water on your skin, feel each breast the same way you did when you were lying down. Write down what you find Writing down what you find can help you remember what to tell your doctor. Write down: What is   normal for each breast. Any changes you find in each breast. These include: The kind of changes you find. A tender or painful breast. Any lump you find. Write down its size and where it is. When you last had your monthly period (menstrual cycle). General tips If you are breastfeeding, the best time to check your breasts is after you feed your baby or after you use a breast pump. If you get monthly bleeding, the best time to check your breasts is 5-7 days after your monthly cycle ends. With time, you will become comfortable with the self-exam. You will  also start to know if there are changes in your breasts. Contact a doctor if: You see a change in the shape or size of your breasts or nipples. You see a change in the skin of your breast or nipples, such as red or scaly skin. You have fluid coming from your nipples that is not normal. You find a new lump or thick area. You have breast pain. You have any concerns about your breast health. Summary Breast self-awareness includes looking for changes in your breasts and feeling for changes within your breasts. You should do breast self-awareness in front of a mirror in a well-lit room. If you get monthly periods (menstrual cycles), the best time to check your breasts is 5-7 days after your period ends. Tell your doctor about any changes you see in your breasts. Changes include changes in size, changes on the skin, painful or tender breasts, or fluid from your nipples that is not normal. This information is not intended to replace advice given to you by your health care provider. Make sure you discuss any questions you have with your health care provider. Document Revised: 10/19/2021 Document Reviewed: 03/16/2021 Elsevier Patient Education  2024 Elsevier Inc.  

## 2023-04-29 NOTE — Progress Notes (Unsigned)
GYNECOLOGY ANNUAL PHYSICAL EXAM PROGRESS NOTE  Subjective:    Robin Vazquez is a 37 y.o. G22P0010 female who presents for an annual exam. She has a history significant for congenital multiple arthrogryposis, hypertension and diabetes. The patient is not sexually active. The patient participates in regular exercise: yes. Has the patient ever been transfused or tattooed?: no. The patient reports that there is not domestic violence in her life.   The patient has the following complaints today:  She has been having some vaginal itching and irritation x 1 month. She treated herself with Nystatin cream for 10 days, symptoms resolved but have now returned.   Reports that she is very proud of herself and she had written a book about her personal life and her condition.  The book is entitled "It Was Never About Me".  Menstrual History: Menarche age: 54 Patient's last menstrual period was 04/19/2023. Period Cycle (Days): 20 Period Duration (Days): 6-7 Period Pattern: Regular Menstrual Flow: Moderate Menstrual Control: Maxi pad Menstrual Control Change Freq (Hours): 3-4 Dysmenorrhea: None   Gynecologic History:  Contraception: abstinence History of STI's: Denies Last Pap: 12/09/2019. Results were: normal. H/o abnormal pap in 10/2016, [facility].   Last mammogram: Never done  OB History  Gravida Para Term Preterm AB Living  1       1    SAB IAB Ectopic Multiple Live Births  1            # Outcome Date GA Lbr Len/2nd Weight Sex Type Anes PTL Lv  1 SAB 10/2016 [redacted]w[redacted]d           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    Diabetes mellitus without complication (HCC)    Herpes    genitial   History of uterine fibroid 09/2015   Hyperlipemia    Hypertension    Obesity    Other fatigue     Past Surgical History:  Procedure Laterality  Date   joint     on legs and feet   MYOMECTOMY ABDOMINAL APPROACH  10/18/2017   performed at [facility]   UTERINE FIBROID SURGERY      Family History  Adopted: Yes  Family history unknown: Yes    Social History   Socioeconomic History   Marital status: Single    Spouse name: Not on file   Number of children: Not on file   Years of education: Not on file   Highest education level: Not on file  Occupational History   Occupation: Disabled    Employer: Not employed  Tobacco Use   Smoking status: Never   Smokeless tobacco: Never  Vaping Use   Vaping status: Never Used  Substance and Sexual Activity   Alcohol use: No   Drug use: No   Sexual activity: Not Currently    Birth control/protection: None  Other Topics Concern   Not on file  Social History Narrative   Not on file   Social Determinants of Health   Financial Resource Strain: Not on file  Food Insecurity: Not on file  Transportation Needs: Not on file  Physical Activity: Not on file  Stress: Not on file  Social Connections: Not on file  Intimate Partner Violence: Not on file    Current Outpatient Medications on File Prior to Visit  Medication Sig Dispense Refill   Accu-Chek Softclix  Lancets lancets 1 each by Other route daily. Use to check blood sugar once daily.     amLODipine (NORVASC) 5 MG tablet TAKE 1 TABLET BY MOUTH ONCE DAILY IN THE MORNING FOR BLOOD PRESSURE 90 tablet 0   Blood Glucose Monitoring Suppl (ACCU-CHEK AVIVA PLUS) w/Device KIT 1 each by Other route daily. Use to check blood sugar once daily.     Dulaglutide (TRULICITY) 3 MG/0.5ML SOAJ Inject 3 mg as directed once a week. 2 mL 0   enalapril (VASOTEC) 5 MG tablet Take 5 mg by mouth daily.     FARXIGA 10 MG TABS tablet TAKE 1 TABLET BY MOUTH IN THE MORNING 30 tablet 0   glipiZIDE (GLUCOTROL) 10 MG tablet Take 1 tab po in morning and half a tab at night (Patient taking differently: Take 5 mg by mouth in the morning and at bedtime. Take 1 tab po in  morning and half a tab at night) 60 tablet 11   glucose blood (ACCU-CHEK AVIVA PLUS) test strip 1 each by Other route daily. Use to check blood pressure once daily.     nystatin cream (MYCOSTATIN) Apply 1 Application topically 2 (two) times daily. 30 g 0   rosuvastatin (CRESTOR) 20 MG tablet Take 20 mg by mouth every evening.     No current facility-administered medications on file prior to visit.    No Known Allergies   Review of Systems Constitutional: negative for chills, fatigue, fevers and sweats Eyes: negative for irritation, redness and visual disturbance Ears, nose, mouth, throat, and face: negative for hearing loss, nasal congestion, snoring and tinnitus Respiratory: negative for asthma, cough, sputum Cardiovascular: negative for chest pain, dyspnea, exertional chest pressure/discomfort, irregular heart beat, palpitations and syncope Gastrointestinal: negative for abdominal pain, change in bowel habits, nausea and vomiting Genitourinary: negative for heavy menstrual periods, genital lesions, sexual problems, dysuria and urinary incontinence. Positive for mild vaginal irritation and discharge for the past month Integument/breast: negative for breast lump, breast tenderness and nipple discharge Hematologic/lymphatic: negative for bleeding and easy bruising Musculoskeletal: negative for back pain and muscle weakness Neurologic: negative for dizziness, headaches, vertigo and weakness Endocrine: negative for diabetic symptoms including polydipsia, polyuria and skin dryness Allergic/Immunologic: negative for hay fever and urticaria      Objective:  Blood pressure 133/86, pulse 89, height 4\' 7"  (1.397 m), weight 188 lb 3.2 oz (85.4 kg), last menstrual period 04/19/2023. Body mass index is 43.74 kg/m.  General Appearance:    Alert, cooperative, no distress, appears stated age, morbid obesity.   Head:    Normocephalic, without obvious abnormality, atraumatic  Eyes:    PERRL,  conjunctiva/corneas clear, EOM's intact, both eyes  Ears:    Normal external ear canals, both ears  Nose:   Nares normal, septum midline, mucosa normal, no drainage or sinus tenderness  Throat:   Lips, mucosa, and tongue normal; teeth and gums normal  Neck:   Supple, symmetrical, trachea midline, no adenopathy; thyroid: no enlargement/tenderness/nodules; no carotid bruit or JVD  Back:     Symmetric, no curvature, ROM normal, no CVA tenderness  Lungs:     Clear to auscultation bilaterally, respirations unlabored  Chest Wall:    No tenderness or deformity   Heart:    Regular rate and rhythm, S1 and S2 normal, no murmur, rub or gallop  Breast Exam:    No tenderness, masses, or nipple abnormality  Abdomen:     Soft, non-tender, bowel sounds active all four quadrants, no masses, no organomegaly.  Genitalia:  External genitalia Vulva: - Normal. Bartholin's Gland - Bilateral - Normal. Perineum - Normal. Clitoris - Normal. Introitus: Characteristics - Normal (However very narrow introitus with prominent pubic arch.). Discharge - None. Urethra: Characteristics - Normal. Speculum & Bimanual  Vagina: No lesions. Vaginal Mucosa - No Atrophy, Relaxation.  No discharge visualized.  Vaginal mucosa difficult to assess as patient very tense during exam and use of pediatric speculum suboptimal against body habitus.  Cervix: Characteristics - Normal (Cervix difficult to assess as patient very tense during exam and use of pediatric speculum suboptimal against body habitus.) Uterus: Characteristics -upper limits of normal size, ~ 10-12 cm.   Position - Midposition. Mobile. Normal Contour Adnexa: Characteristics - Bilateral - Normal. However difficult to assess due to body habitus.   Rectal:    Normal external sphincter.  No hemorrhoids appreciated. Internal exam not done.   Extremities:   Joint contraction of knees and elbows bilaterally, shortened upper and lower limbs. Atraumatic, no cyanosis or edema  Pulses:    2+ and symmetric all extremities  Skin:   Skin color, texture, turgor normal, no rashes.   Lymph nodes:   Cervical, supraclavicular, and axillary nodes normal  Neurologic:   CNII-XII intact, normal strength, sensation and reflexes throughout    Labs:  Lab Results  Component Value Date   WBC 7.5 04/04/2023   HGB 10.8 (L) 04/04/2023   HCT 38.3 04/04/2023   MCV 74.1 (L) 04/04/2023   PLT 561 (H) 04/04/2023    Lab Results  Component Value Date   CREATININE 0.36 (L) 04/04/2023   BUN 12 04/04/2023   NA 135 04/04/2023   K 3.6 04/04/2023   CL 102 04/04/2023   CO2 20 (L) 04/04/2023    Lab Results  Component Value Date   ALT 19 04/04/2023   AST 17 04/04/2023   ALKPHOS 72 04/04/2023   BILITOT 0.4 04/04/2023    Lab Results  Component Value Date   TSH 1.303 11/16/2022     Lab Results  Component Value Date   CHOL 136 04/04/2023   HDL 41 04/04/2023   LDLCALC 87 04/04/2023   TRIG 39 04/04/2023   CHOLHDL 3.3 04/04/2023    Lab Results  Component Value Date   HGBA1C 8.3 (H) 04/04/2023     Assessment:   1. Encounter for well woman exam with routine gynecological exam   2. Acute vaginitis   3. Breast cancer screening by mammogram   4. Cervical cancer screening     Plan:    - Labs: Reviewed, recently performed by PCP.  - Breast self exam technique reviewed and patient encouraged to perform self-exam monthly. - Discussed healthy lifestyle modifications.  - Pap smear: performed today. Patient notes after this she would like to extend to 5 year screens.  -Mammogram ordered, advised the patient can schedule anytime after July of next year. - HTN overall controlled. Notes complaince with medications. Managed by PCP.  - DM currently uncontrolled on medications.  Managed by PCP.  Discussed that she most likely is developing yeast infection due to her uncontrolled diabetes.  Nystatin cream is not helpful for patient.  Will prescribe Diflucan with several refills. -  Contraception: Maintaining abstinence.   - COVID vaccine declined.  -  RTC in 1 year for annual exam.    Hildred Laser, MD [facility]

## 2023-04-30 ENCOUNTER — Ambulatory Visit (INDEPENDENT_AMBULATORY_CARE_PROVIDER_SITE_OTHER): Payer: Medicaid Other | Admitting: Obstetrics and Gynecology

## 2023-04-30 ENCOUNTER — Other Ambulatory Visit (HOSPITAL_COMMUNITY)
Admission: RE | Admit: 2023-04-30 | Discharge: 2023-04-30 | Disposition: A | Discharge status: Home / Self Care | Payer: Medicaid Other | Source: Ambulatory Visit | Attending: Obstetrics and Gynecology | Admitting: Obstetrics and Gynecology

## 2023-04-30 VITALS — BP 133/86 | HR 89 | Ht <= 58 in | Wt 188.2 lb

## 2023-04-30 DIAGNOSIS — N760 Acute vaginitis: Secondary | ICD-10-CM

## 2023-04-30 DIAGNOSIS — Z01419 Encounter for gynecological examination (general) (routine) without abnormal findings: Secondary | ICD-10-CM | POA: Insufficient documentation

## 2023-04-30 DIAGNOSIS — Z124 Encounter for screening for malignant neoplasm of cervix: Secondary | ICD-10-CM

## 2023-04-30 DIAGNOSIS — Z1231 Encounter for screening mammogram for malignant neoplasm of breast: Secondary | ICD-10-CM

## 2023-04-30 MED ORDER — FLUCONAZOLE 150 MG PO TABS: 150.0000 mg | ORAL_TABLET | ORAL | 3 refills | Status: DC

## 2023-05-01 ENCOUNTER — Encounter: Payer: Self-pay | Admitting: Obstetrics and Gynecology

## 2023-05-07 ENCOUNTER — Ambulatory Visit (INDEPENDENT_AMBULATORY_CARE_PROVIDER_SITE_OTHER): Payer: Medicaid Other | Admitting: Nurse Practitioner

## 2023-05-07 ENCOUNTER — Encounter: Payer: Self-pay | Admitting: Nurse Practitioner

## 2023-05-07 VITALS — BP 136/88 | HR 68 | Temp 98.4°F | Ht <= 58 in | Wt 186.0 lb

## 2023-05-07 DIAGNOSIS — E1165 Type 2 diabetes mellitus with hyperglycemia: Secondary | ICD-10-CM | POA: Diagnosis not present

## 2023-05-07 DIAGNOSIS — Z6841 Body Mass Index (BMI) 40.0 and over, adult: Secondary | ICD-10-CM

## 2023-05-07 DIAGNOSIS — D649 Anemia, unspecified: Secondary | ICD-10-CM | POA: Diagnosis not present

## 2023-05-07 DIAGNOSIS — Z7985 Long-term (current) use of injectable non-insulin antidiabetic drugs: Secondary | ICD-10-CM

## 2023-05-07 LAB — CYTOLOGY - PAP
Adequacy: ABSENT
Comment: NEGATIVE
Comment: NEGATIVE
Comment: NEGATIVE
Diagnosis: UNDETERMINED — AB
HPV 16: NEGATIVE
HPV 18 / 45: NEGATIVE
High risk HPV: POSITIVE — AB

## 2023-05-07 MED ORDER — TRULICITY 4.5 MG/0.5ML ~~LOC~~ SOAJ: 4.5000 mg | SUBCUTANEOUS | 0 refills | Status: DC

## 2023-05-07 NOTE — Progress Notes (Signed)
Office: [phone]  /  Fax: [phone]  WEIGHT SUMMARY AND BIOMETRICS  Weight Lost Since Last Visit: 0  Weight Gained Since Last Visit: 2 lb   Vitals Temp: 98.4 F (36.9 C) BP: 136/88 Pulse Rate: 68 SpO2: 100 %   Anthropometric Measurements Height: 4\' 7"  (1.397 m) Weight: 186 lb (84.4 kg) BMI (Calculated): 43.23 Weight Lost Since Last Visit: 0 Weight Gained Since Last Visit: 2 lb Starting Weight: 186 lb Total Weight Loss (lbs): 0 lb (0 kg)   No data recorded Other Clinical Data Fasting: no Labs: no Today's Visit #: 24 Starting Date: 05/31/21     HPI  Chief Complaint: OBESITY  Cloe is here to discuss her progress with her obesity treatment plan. She is on the practicing portion control and making smarter food choices, such as increasing vegetables and decreasing simple carbohydrates and states she is following her eating plan approximately 60 % of the time. She states she is exercising 15 minutes 3 days per week.   Interval History:  Since last office visit she has gained 2 pounds.  She is leaving to go to [place] next week until [place]. She is eating 2+ meals per day.  Occ snacks on chips, ice cream, 100 cal packs.  She is drinking water daily.    Pharmacotherapy for weight loss: She is not currently taking medications  for medical weight loss.     Previous pharmacotherapy for medical weight loss:  Phentermine   Bariatric surgery:  Patient has not had bariatric surgery.   Pharmacotherapy for DMT2:  She is currently taking Farxiga 10mg , Trulicity 3 mg (increased after last visit on 04/16/23-started on 04/18/23) and glipizide 10mg  am and 5 mg in the evening .  Denies side effects.   Last A1c was 8.3 on 04/04/23 CBGs: 85-175   Episodes of hypoglycemia: no On ACE and statin.  Last eye exam:  2023 She has tried Metformin and Ozempic  in the past.  -She stopped Metformin due to side effects of diarrhea.   -She stopped Ozempic due to side  effects of vomiting and reflux.  Notes polyphagia and cravings in the evening.      Lab Results  Component Value Date   HGBA1C 8.3 (H) 04/04/2023   HGBA1C 7.1 (H) 11/16/2022   HGBA1C 6.3 (H) 07/10/2022   Lab Results  Component Value Date   MICROALBUR <3.0 (H) 08/14/2016   LDLCALC 87 04/04/2023   CREATININE 0.36 (L) 04/04/2023    Anemia Not currently taking iron.  Had myomectomy exc fibroid tumors on 10/18/17.  She reports "my cycles are not as heavy as they used to be".  She saw oncology in 2012.  Never had iron infusions.  Saw GYN on 04/30/23.  Noted she discussed labs with GYN.  Anemia wasn't addressed during this visit.  Never had a colonoscopy or EGD.    PHYSICAL EXAM:  Blood pressure 136/88, pulse 68, temperature 98.4 F (36.9 C), height 4\' 7"  (1.397 m), weight 186 lb (84.4 kg), last menstrual period 04/19/2023, SpO2 100%. Body mass index is 43.23 kg/m.  General: She is overweight, cooperative, alert, well developed, and in no acute distress. PSYCH: Has normal mood, affect and thought process.   Extremities: No edema.  Neurologic: No gross sensory or motor deficits. No tremors or fasciculations noted.    DIAGNOSTIC DATA REVIEWED:  BMET    Component Value Date/Time   NA 135 04/04/2023 1438   K 3.6 04/04/2023 1438   CL 102 04/04/2023  1438   CO2 20 (L) 04/04/2023 1438   GLUCOSE 152 (H) 04/04/2023 1438   BUN 12 04/04/2023 1438   CREATININE 0.36 (L) 04/04/2023 1438   CALCIUM 8.7 (L) 04/04/2023 1438   GFRNONAA >60 04/04/2023 1438   GFRAA >60 11/02/2019 1401   Lab Results  Component Value Date   HGBA1C 8.3 (H) 04/04/2023   HGBA1C 6.2 (H) 07/28/2015   No results found for: "INSULIN" Lab Results  Component Value Date   TSH 1.303 11/16/2022   CBC    Component Value Date/Time   WBC 7.5 04/04/2023 1438   RBC 5.17 (H) 04/04/2023 1438   HGB 10.8 (L) 04/04/2023 1438   HCT 38.3 04/04/2023 1438   PLT 561 (H) 04/04/2023 1438   MCV 74.1 (L) 04/04/2023 1438   MCH 20.9  (L) 04/04/2023 1438   MCHC 28.2 (L) 04/04/2023 1438   RDW 18.5 (H) 04/04/2023 1438   Iron Studies    Component Value Date/Time   FERRITIN 4 (L) 04/04/2023 1438   Lipid Panel     Component Value Date/Time   CHOL 136 04/04/2023 1438   TRIG 39 04/04/2023 1438   HDL 41 04/04/2023 1438   CHOLHDL 3.3 04/04/2023 1438   VLDL 8 04/04/2023 1438   LDLCALC 87 04/04/2023 1438   Hepatic Function Panel     Component Value Date/Time   PROT 7.8 04/04/2023 1438   ALBUMIN 4.1 04/04/2023 1438   AST 17 04/04/2023 1438   ALT 19 04/04/2023 1438   ALKPHOS 72 04/04/2023 1438   BILITOT 0.4 04/04/2023 1438      Component Value Date/Time   TSH 1.303 11/16/2022 1303   Nutritional Lab Results  Component Value Date   VD25OH 43.85 04/04/2023   VD25OH See Scanned report in [facility] 07/10/2022   VD25OH 53.97 12/05/2021     ASSESSMENT AND PLAN  TREATMENT PLAN FOR OBESITY:  Recommended Dietary Goals  Esm… is currently in the action stage of change. As such, her goal is to continue weight management plan. She has agreed to practicing portion control and making smarter food choices, such as increasing vegetables and decreasing simple carbohydrates.  Behavioral Intervention  We discussed the following Behavioral Modification Strategies today: increasing lean protein intake to established goals, decreasing simple carbohydrates , increasing vegetables, increasing water intake , work on meal planning and preparation, reading food labels , keeping healthy foods at home, celebration eating strategies, and continue to work on maintaining a reduced calorie state, getting the recommended amount of protein, incorporating whole foods, making healthy choices, staying well hydrated and practicing mindfulness when eating..  Additional resources provided today: NA  Recommended Physical Activity Goals  Donnisha has been advised to work up to 150 minutes of moderate intensity aerobic activity a week  and strengthening exercises 2-3 times per week for cardiovascular health, weight loss maintenance and preservation of muscle mass.   She has agreed to Think about enjoyable ways to increase daily physical activity and overcoming barriers to exercise, Increase physical activity in their day and reduce sedentary time (increase NEAT)., Increase the intensity, frequency or duration of strengthening exercises , and Increase the intensity, frequency or duration of aerobic exercises     ASSOCIATED CONDITIONS ADDRESSED TODAY  Action/Plan  Type 2 diabetes mellitus with hyperglycemia, without long-term current use of insulin (HCC) -    Continue Trulicity 3mg  x 1 more dose and then increase Trulicity; Inject 4.5 mg as directed once a week due last A1c.  Dispense: 2 mL; Refill:  0.  Side effects discussed.    Good blood sugar control is important to decrease the likelihood of diabetic complications such as nephropathy, neuropathy, limb loss, blindness, coronary artery disease, and death. Intensive lifestyle modification including diet, exercise and weight loss are the first line of treatment for diabetes.    Anemia, unspecified type Discussed with patient today.  Will reach out to GYN and PCP. Offered referral to hematology.    Morbid obesity (HCC): Starting BMI 43  BMI 40.0-44.9, adult (HCC)         Return in about 4 weeks (around 06/04/2023).Marland Kitchen She was informed of the importance of frequent follow up visits to maximize her success with intensive lifestyle modifications for her multiple health conditions.   ATTESTASTION STATEMENTS:  Reviewed by clinician on day of visit: allergies, medications, problem list, medical history, surgical history, family history, social history, and previous encounter notes.     Theodis Sato. Dijuan Sleeth FNP-C

## 2023-05-08 ENCOUNTER — Other Ambulatory Visit: Payer: Self-pay | Admitting: Internal Medicine

## 2023-05-08 ENCOUNTER — Other Ambulatory Visit: Payer: Self-pay | Admitting: Family

## 2023-05-09 ENCOUNTER — Other Ambulatory Visit: Payer: Self-pay

## 2023-05-09 MED ORDER — AMLODIPINE BESYLATE 5 MG PO TABS: 5.0000 mg | ORAL_TABLET | Freq: Every day | ORAL | 1 refills | Status: DC

## 2023-05-09 MED ORDER — ENALAPRIL MALEATE 5 MG PO TABS: 5.0000 mg | ORAL_TABLET | Freq: Every day | ORAL | 1 refills | Status: DC

## 2023-06-04 ENCOUNTER — Other Ambulatory Visit: Payer: Self-pay | Admitting: Cardiology

## 2023-06-04 DIAGNOSIS — E1165 Type 2 diabetes mellitus with hyperglycemia: Secondary | ICD-10-CM

## 2023-06-06 ENCOUNTER — Ambulatory Visit: Payer: Medicaid Other | Admitting: Bariatrics

## 2023-06-11 ENCOUNTER — Other Ambulatory Visit: Payer: Self-pay | Admitting: Nurse Practitioner

## 2023-06-11 ENCOUNTER — Ambulatory Visit: Payer: Medicaid Other | Admitting: Bariatrics

## 2023-06-11 DIAGNOSIS — E1165 Type 2 diabetes mellitus with hyperglycemia: Secondary | ICD-10-CM

## 2023-06-12 ENCOUNTER — Other Ambulatory Visit: Payer: Self-pay | Admitting: Nurse Practitioner

## 2023-06-12 DIAGNOSIS — E1165 Type 2 diabetes mellitus with hyperglycemia: Secondary | ICD-10-CM

## 2023-06-18 ENCOUNTER — Ambulatory Visit: Payer: Medicaid Other | Admitting: Bariatrics

## 2023-07-02 ENCOUNTER — Encounter: Payer: Self-pay | Admitting: Nurse Practitioner

## 2023-07-02 ENCOUNTER — Other Ambulatory Visit: Payer: Self-pay | Admitting: Cardiology

## 2023-07-02 ENCOUNTER — Ambulatory Visit: Payer: Medicaid Other | Admitting: Nurse Practitioner

## 2023-07-02 ENCOUNTER — Encounter: Payer: Self-pay | Admitting: Obstetrics and Gynecology

## 2023-07-02 VITALS — BP 119/81 | HR 77 | Temp 98.1°F | Ht <= 58 in | Wt 182.0 lb

## 2023-07-02 DIAGNOSIS — Z7985 Long-term (current) use of injectable non-insulin antidiabetic drugs: Secondary | ICD-10-CM

## 2023-07-02 DIAGNOSIS — D649 Anemia, unspecified: Secondary | ICD-10-CM | POA: Diagnosis not present

## 2023-07-02 DIAGNOSIS — Z6841 Body Mass Index (BMI) 40.0 and over, adult: Secondary | ICD-10-CM

## 2023-07-02 DIAGNOSIS — E1165 Type 2 diabetes mellitus with hyperglycemia: Secondary | ICD-10-CM

## 2023-07-02 DIAGNOSIS — E66813 Obesity, class 3: Secondary | ICD-10-CM | POA: Diagnosis not present

## 2023-07-02 DIAGNOSIS — Z7984 Long term (current) use of oral hypoglycemic drugs: Secondary | ICD-10-CM

## 2023-07-02 DIAGNOSIS — Z79899 Other long term (current) drug therapy: Secondary | ICD-10-CM

## 2023-07-02 MED ORDER — TRULICITY 4.5 MG/0.5ML ~~LOC~~ SOAJ: 4.5000 mg | SUBCUTANEOUS | 0 refills | Status: DC

## 2023-07-02 NOTE — Progress Notes (Signed)
 Office: [phone]  /  Fax: [phone]  WEIGHT SUMMARY AND BIOMETRICS  Weight Lost Since Last Visit: 4lb  Weight Gained Since Last Visit: 0lb   Vitals Temp: 98.1 F (36.7 C) BP: 119/81 Pulse Rate: 77 SpO2: 99 %   Anthropometric Measurements Height: 4' 7 (1.397 m) Weight: 182 lb (82.6 kg) BMI (Calculated): 42.3 Weight at Last Visit: 186lb Weight Lost Since Last Visit: 4lb Weight Gained Since Last Visit: 0lb Starting Weight: 186lb Total Weight Loss (lbs): 0 lb (0 kg)   No data recorded Other Clinical Data Fasting: No Labs: No Today's Visit #: 25 Starting Date: 05/31/21     HPI  Chief Complaint: OBESITY  Robin Vazquez is here to discuss her progress with her obesity treatment plan. She is on the practicing portion control and making smarter food choices, such as increasing vegetables and decreasing simple carbohydrates and states she is following her eating plan approximately 50 % of the time. She states she is exercising 10 minutes 2 days per week.   Interval History:  Since last office visit she has lost 4 pounds.  She went to [place]  since her last visit.  She is drinking water and snapple.   Pharmacotherapy for weight loss: She is not currently taking medications  for medical weight loss.     Previous pharmacotherapy for medical weight loss:  Phentermine   Bariatric surgery:  Patient has not had bariatric surgery.  Pharmacotherapy for DMT2:   She is currently taking Farxiga  10mg , Trulicity  4.5 mg (started on 04/18/23) and glipizide  10mg  am and 5 mg in the evening Denies side effects.   Last A1c was 8.3 on 04/04/23 CBGs: 85-175   Episodes of hypoglycemia: no On ACE and statin-crestor  20mg  and enalapril  5 mg.   Last eye exam:  2023 She has tried Metformin and Ozempic   in the past.  -She stopped Metformin due to side effects of diarrhea.   -She stopped Ozempic  due to side effects of vomiting and reflux.  Notes polyphagia and cravings in the evening.          Lab Results  Component Value Date   HGBA1C 8.3 (H) 04/04/2023   HGBA1C 7.1 (H) 11/16/2022   HGBA1C 6.3 (H) 07/10/2022   Lab Results  Component Value Date   MICROALBUR <3.0 (H) 08/14/2016   LDLCALC 87 04/04/2023   CREATININE 0.36 (L) 04/04/2023    Anemia Saw GYN on 04/30/23.  She is not taking iron.  Had myomectomy exc fibroid tumors on 10/18/17. She reports my cycles are not as heavy as they used to be. She saw oncology in 2012. Never had iron infusions. Never had a colonoscopy or EGD.    PHYSICAL EXAM:  Blood pressure 119/81, pulse 77, temperature 98.1 F (36.7 C), height 4' 7 (1.397 m), weight 182 lb (82.6 kg), last menstrual period 06/11/2023, SpO2 99%. Body mass index is 42.3 kg/m.  General: She is overweight, cooperative, alert, well developed, and in no acute distress. PSYCH: Has normal mood, affect and thought process.   Extremities: No edema.  Neurologic: No gross sensory or motor deficits. No tremors or fasciculations noted.    DIAGNOSTIC DATA REVIEWED:  BMET    Component Value Date/Time   NA 135 04/04/2023 1438   K 3.6 04/04/2023 1438   CL 102 04/04/2023 1438   CO2 20 (L) 04/04/2023 1438   GLUCOSE 152 (H) 04/04/2023 1438   BUN 12 04/04/2023 1438   CREATININE 0.36 (L) 04/04/2023 1438   CALCIUM  8.7 (L) 04/04/2023 1438  GFRNONAA >60 04/04/2023 1438   GFRAA >60 11/02/2019 1401   Lab Results  Component Value Date   HGBA1C 8.3 (H) 04/04/2023   HGBA1C 6.2 (H) 07/28/2015   No results found for: INSULIN  Lab Results  Component Value Date   TSH 1.303 11/16/2022   CBC    Component Value Date/Time   WBC 7.5 04/04/2023 1438   RBC 5.17 (H) 04/04/2023 1438   HGB 10.8 (L) 04/04/2023 1438   HCT 38.3 04/04/2023 1438   PLT 561 (H) 04/04/2023 1438   MCV 74.1 (L) 04/04/2023 1438   MCH 20.9 (L) 04/04/2023 1438   MCHC 28.2 (L) 04/04/2023 1438   RDW 18.5 (H) 04/04/2023 1438   Iron Studies    Component Value Date/Time   FERRITIN 4 (L) 04/04/2023  1438   Lipid Panel     Component Value Date/Time   CHOL 136 04/04/2023 1438   TRIG 39 04/04/2023 1438   HDL 41 04/04/2023 1438   CHOLHDL 3.3 04/04/2023 1438   VLDL 8 04/04/2023 1438   LDLCALC 87 04/04/2023 1438   Hepatic Function Panel     Component Value Date/Time   PROT 7.8 04/04/2023 1438   ALBUMIN 4.1 04/04/2023 1438   AST 17 04/04/2023 1438   ALT 19 04/04/2023 1438   ALKPHOS 72 04/04/2023 1438   BILITOT 0.4 04/04/2023 1438      Component Value Date/Time   TSH 1.303 11/16/2022 1303   Nutritional Lab Results  Component Value Date   VD25OH 43.85 04/04/2023   VD25OH See Scanned report in [facility] 07/10/2022   VD25OH 53.97 12/05/2021     ASSESSMENT AND PLAN  TREATMENT PLAN FOR OBESITY:  Recommended Dietary Goals  Robin Vazquez is currently in the action stage of change. As such, her goal is to continue weight management plan. She has agreed to practicing portion control and making smarter food choices, such as increasing vegetables and decreasing simple carbohydrates.  Behavioral Intervention  We discussed the following Behavioral Modification Strategies today: increasing lean protein intake to established goals, decreasing simple carbohydrates , increasing vegetables, increasing fiber rich foods, increasing water intake , reading food labels , keeping healthy foods at home, continue to work on implementation of reduced calorie nutritional plan, continue to practice mindfulness when eating, planning for success, better snacking choices, and continue to work on maintaining a reduced calorie state, getting the recommended amount of protein, incorporating whole foods, making healthy choices, staying well hydrated and practicing mindfulness when eating..  Additional resources provided today: NA  Recommended Physical Activity Goals  Robin Vazquez has been advised to work up to 150 minutes of moderate intensity aerobic activity a week and strengthening exercises 2-3 times  per week for cardiovascular health, weight loss maintenance and preservation of muscle mass.   She has agreed to Think about enjoyable ways to increase daily physical activity and overcoming barriers to exercise, Increase physical activity in their day and reduce sedentary time (increase NEAT)., Increase the intensity, frequency or duration of strengthening exercises , and Increase the intensity, frequency or duration of aerobic exercises     ASSOCIATED CONDITIONS ADDRESSED TODAY  Action/Plan  Type 2 diabetes mellitus with hyperglycemia, without long-term current use of insulin  (HCC) -     Hemoglobin A1c -     Trulicity ; Inject 4.5 mg into the skin once a week.  Dispense: 2 mL; Refill: 0  Anemia, unspecified type -     CBC with Differential/Platelet -     Iron -  Ferritin  I once again discussed a referral to hematology and patient refused referral.  Medication management -     Comprehensive metabolic panel  Class 3 severe obesity due to excess calories with serious comorbidity and body mass index (BMI) of 40.0 to 44.9 in adult [facility])         Return in about 4 weeks (around 07/30/2023).Robin Vazquez She was informed of the importance of frequent follow up visits to maximize her success with intensive lifestyle modifications for her multiple health conditions.   ATTESTASTION STATEMENTS:  Reviewed by clinician on day of visit: allergies, medications, problem list, medical history, surgical history, family history, social history, and previous encounter notes.   Time spent on visit including pre-visit chart review and post-visit care and charting was 30 minutes.    Robin Vazquez SAUNDERS. Oretta Berkland FNP-C

## 2023-07-09 ENCOUNTER — Encounter: Payer: Self-pay | Admitting: Cardiology

## 2023-07-09 ENCOUNTER — Ambulatory Visit: Payer: Medicaid Other | Admitting: Cardiology

## 2023-07-09 VITALS — BP 114/60 | HR 97 | Ht <= 58 in | Wt 185.0 lb

## 2023-07-09 DIAGNOSIS — R109 Unspecified abdominal pain: Secondary | ICD-10-CM | POA: Diagnosis not present

## 2023-07-09 DIAGNOSIS — E782 Mixed hyperlipidemia: Secondary | ICD-10-CM | POA: Diagnosis not present

## 2023-07-09 DIAGNOSIS — E119 Type 2 diabetes mellitus without complications: Secondary | ICD-10-CM | POA: Diagnosis not present

## 2023-07-09 DIAGNOSIS — E559 Vitamin D deficiency, unspecified: Secondary | ICD-10-CM | POA: Diagnosis not present

## 2023-07-09 DIAGNOSIS — I10 Essential (primary) hypertension: Secondary | ICD-10-CM

## 2023-07-09 LAB — POCT URINALYSIS DIPSTICK
Bilirubin, UA: NEGATIVE
Glucose, UA: POSITIVE — AB
Ketones, UA: NEGATIVE
Leukocytes, UA: NEGATIVE
Nitrite, UA: NEGATIVE
Protein, UA: NEGATIVE
Spec Grav, UA: 1.015 (ref 1.010–1.025)
Urobilinogen, UA: 0.2 U/dL
pH, UA: 6 (ref 5.0–8.0)

## 2023-07-09 LAB — POCT UA - MICROALBUMIN
Creatinine, POC: 50 mg/dL
Microalbumin Ur, POC: 30 mg/L

## 2023-07-09 MED ORDER — ROSUVASTATIN CALCIUM 20 MG PO TABS: 20.0000 mg | ORAL_TABLET | Freq: Every evening | ORAL | 3 refills | Status: AC

## 2023-07-09 MED ORDER — CIPROFLOXACIN HCL 500 MG PO TABS: 500.0000 mg | ORAL_TABLET | Freq: Two times a day (BID) | ORAL | 0 refills | Status: AC

## 2023-07-09 MED ORDER — FLUCONAZOLE 150 MG PO TABS: 150.0000 mg | ORAL_TABLET | ORAL | 3 refills | Status: DC

## 2023-07-09 NOTE — Progress Notes (Unsigned)
Established Patient Office Visit  Subjective:  Patient ID: Robin Vazquez, female    DOB: [birth year]  Age: 38 y.o. MRN: [id number]  Chief Complaint  Patient presents with   Follow-up    Follow Up Lab Results     Patient in office for follow up, did not have fasting lab work done. Patient complaining of flank pain similar to previous UTI. Will order a UA today and will send for culture. Cipro sent to the pharmacy. Patient also complaining of a yeast infection, gets them frequently due to DM. Diflucan sent in. Patient sees GYN for pap smears.      No other concerns at this time.   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    Diabetes mellitus without complication (HCC)    Herpes    genitial   History of uterine fibroid 09/2015   Hyperlipemia    Hypertension    Obesity    Other fatigue     Past Surgical History:  Procedure Laterality Date   joint     on legs and feet   MYOMECTOMY ABDOMINAL APPROACH  10/18/2017   performed at [facility]   UTERINE FIBROID SURGERY      Social History   Socioeconomic History   Marital status: Single    Spouse name: Not on file   Number of children: Not on file   Years of education: Not on file   Highest education level: Not on file  Occupational History   Occupation: Disabled    Employer: Not employed  Tobacco Use   Smoking status: Never   Smokeless tobacco: Never  Vaping Use   Vaping status: Never Used  Substance and Sexual Activity   Alcohol use: No   Drug use: No   Sexual activity: Not Currently    Birth control/protection: None  Other Topics Concern   Not on file  Social History Narrative   Not on file   Social Drivers of Health   Financial Resource Strain: Not on file  Food Insecurity: Not on file  Transportation Needs: Not on file  Physical Activity: Not on file   Stress: Not on file  Social Connections: Not on file  Intimate Partner Violence: Not on file    Family History  Adopted: Yes  Family history unknown: Yes    No Known Allergies  Outpatient Medications Prior to Visit  Medication Sig   Accu-Chek Softclix Lancets lancets 1 each by Other route daily. Use to check blood sugar once daily.   amLODipine (NORVASC) 5 MG tablet Take 1 tablet (5 mg total) by mouth daily.   Blood Glucose Monitoring Suppl (ACCU-CHEK AVIVA PLUS) w/Device KIT 1 each by Other route daily. Use to check blood sugar once daily.   Dulaglutide (TRULICITY) 4.5 MG/0.5ML SOAJ Inject 4.5 mg into the skin once a week.   enalapril (VASOTEC) 20 MG tablet TAKE 1 TABLET BY MOUTH IN THE MORNING FOR BLOOD PRESSURE   enalapril (VASOTEC) 5 MG tablet Take 1 tablet (5 mg total) by mouth daily.   FARXIGA 10 MG TABS tablet TAKE 1 TABLET BY MOUTH IN THE MORNING   glipiZIDE (GLUCOTROL) 10 MG tablet Take 1 tab po in morning and half a tab at night (Patient taking differently: Take 5 mg by mouth in the morning and at bedtime. Take 1 tab po in morning and half a  tab at night)   glucose blood (ACCU-CHEK AVIVA PLUS) test strip 1 each by Other route daily. Use to check blood pressure once daily.   nystatin cream (MYCOSTATIN) Apply 1 Application topically 2 (two) times daily.   [DISCONTINUED] fluconazole (DIFLUCAN) 150 MG tablet Take 1 tablet (150 mg total) by mouth every 3 (three) days. For  up to three doses   [DISCONTINUED] rosuvastatin (CRESTOR) 20 MG tablet Take 20 mg by mouth every evening.   No facility-administered medications prior to visit.    Review of Systems  Constitutional: Negative.   HENT: Negative.    Eyes: Negative.   Respiratory: Negative.  Negative for shortness of breath.   Cardiovascular: Negative.  Negative for chest pain.  Gastrointestinal: Negative.  Negative for abdominal pain, constipation and diarrhea.  Genitourinary:  Positive for flank pain.  Musculoskeletal:   Negative for joint pain and myalgias.  Skin: Negative.   Neurological: Negative.  Negative for dizziness and headaches.  Endo/Heme/Allergies: Negative.   All other systems reviewed and are negative.      Objective:   BP 114/60   Pulse 97   Ht 4\' 7"  (1.397 m)   Wt 185 lb (83.9 kg)   LMP 06/11/2023 (Approximate)   SpO2 97%   BMI 43.00 kg/m   Vitals:   07/09/23 1311  BP: 114/60  Pulse: 97  Height: 4\' 7"  (1.397 m)  Weight: 185 lb (83.9 kg)  SpO2: 97%  BMI (Calculated): 43    Physical Exam Vitals and nursing note reviewed.  Constitutional:      Appearance: Normal appearance. She is normal weight.  HENT:     Head: Normocephalic and atraumatic.     Nose: Nose normal.     Mouth/Throat:     Mouth: Mucous membranes are moist.  Eyes:     Extraocular Movements: Extraocular movements intact.     Conjunctiva/sclera: Conjunctivae normal.     Pupils: Pupils are equal, round, and reactive to light.  Cardiovascular:     Rate and Rhythm: Normal rate and regular rhythm.     Pulses: Normal pulses.     Heart sounds: Normal heart sounds.  Pulmonary:     Effort: Pulmonary effort is normal.     Breath sounds: Normal breath sounds.  Abdominal:     General: Abdomen is flat. Bowel sounds are normal.     Palpations: Abdomen is soft.  Musculoskeletal:        General: Normal range of motion.     Cervical back: Normal range of motion.  Skin:    General: Skin is warm and dry.  Neurological:     General: No focal deficit present.     Mental Status: She is alert and oriented to person, place, and time.  Psychiatric:        Mood and Affect: Mood normal.        Behavior: Behavior normal.        Thought Content: Thought content normal.        Judgment: Judgment normal.      No results found for any visits on 07/09/23.  Recent Results (from the past 2160 hours)  Cytology - PAP     Status: Abnormal   Collection Time: 04/30/23  2:57 PM  Result Value Ref Range   High risk HPV  Positive (A)    HPV 16 Negative    HPV 18 / 45 Negative    Adequacy      Satisfactory for evaluation; transformation zone component ABSENT.  Diagnosis (A)     - Atypical squamous cells of undetermined significance (ASC-US)   Comment Normal Reference Range HPV - Negative    Comment Normal Reference Range HPV 16- Negative    Comment Normal Reference Range HPV 16 18 45 -Negative       Assessment & Plan:  UA and urine culture. Cipro sent in. Diflucan sent in for yeast infection.   Problem List Items Addressed This Visit       Cardiovascular and Mediastinum   Essential hypertension - Primary   Relevant Medications   rosuvastatin (CRESTOR) 20 MG tablet     Endocrine   Diabetes mellitus (HCC)   Relevant Medications   rosuvastatin (CRESTOR) 20 MG tablet   Other Relevant Orders   POCT Urine Albumin/Creatinine with ratio [[id number]]     Other   Hyperlipemia   Relevant Medications   rosuvastatin (CRESTOR) 20 MG tablet   Morbid obesity (HCC)   Vitamin D deficiency   Flank pain   Relevant Orders   Urine Culture    Return in about 3 months (around 10/06/2023).   Total time spent: 25 minutes  [employer], NP  07/09/2023   This document may have been prepared by Dragon Voice Recognition software and as such may include unintentional dictation errors.

## 2023-07-11 ENCOUNTER — Other Ambulatory Visit: Payer: Self-pay | Admitting: Cardiology

## 2023-07-11 LAB — URINE CULTURE

## 2023-07-11 MED ORDER — PENICILLIN V POTASSIUM 500 MG PO TABS: 500.0000 mg | ORAL_TABLET | Freq: Two times a day (BID) | ORAL | 0 refills | Status: AC

## 2023-07-11 NOTE — Progress Notes (Signed)
Informed via Mychart message

## 2023-07-25 ENCOUNTER — Other Ambulatory Visit (HOSPITAL_COMMUNITY)
Admission: RE | Admit: 2023-07-25 | Discharge: 2023-07-25 | Disposition: A | Discharge status: Home / Self Care | Payer: Medicaid Other | Source: Ambulatory Visit | Attending: Nurse Practitioner | Admitting: Nurse Practitioner

## 2023-07-25 DIAGNOSIS — Z79899 Other long term (current) drug therapy: Secondary | ICD-10-CM | POA: Diagnosis not present

## 2023-07-25 DIAGNOSIS — E1165 Type 2 diabetes mellitus with hyperglycemia: Secondary | ICD-10-CM | POA: Diagnosis not present

## 2023-07-25 DIAGNOSIS — D649 Anemia, unspecified: Secondary | ICD-10-CM | POA: Insufficient documentation

## 2023-07-25 LAB — CBC WITH DIFFERENTIAL/PLATELET
Abs Immature Granulocytes: 0.02 10*3/uL (ref 0.00–0.07)
Basophils Absolute: 0.1 10*3/uL (ref 0.0–0.1)
Basophils Relative: 1 %
Eosinophils Absolute: 0.1 10*3/uL (ref 0.0–0.5)
Eosinophils Relative: 1 %
HCT: 41.5 % (ref 36.0–46.0)
Hemoglobin: 12.8 g/dL (ref 12.0–15.0)
Immature Granulocytes: 0 %
Lymphocytes Relative: 42 %
Lymphs Abs: 3 10*3/uL (ref 0.7–4.0)
MCH: 24 pg — ABNORMAL LOW (ref 26.0–34.0)
MCHC: 30.8 g/dL (ref 30.0–36.0)
MCV: 77.7 fL — ABNORMAL LOW (ref 80.0–100.0)
Monocytes Absolute: 0.5 10*3/uL (ref 0.1–1.0)
Monocytes Relative: 6 %
Neutro Abs: 3.6 10*3/uL (ref 1.7–7.7)
Neutrophils Relative %: 50 %
Platelets: 448 10*3/uL — ABNORMAL HIGH (ref 150–400)
RBC: 5.34 MIL/uL — ABNORMAL HIGH (ref 3.87–5.11)
RDW: 15.5 % (ref 11.5–15.5)
WBC: 7.2 10*3/uL (ref 4.0–10.5)
nRBC: 0 % (ref 0.0–0.2)

## 2023-07-25 LAB — COMPREHENSIVE METABOLIC PANEL
ALT: 22 U/L (ref 0–44)
AST: 15 U/L (ref 15–41)
Albumin: 4 g/dL (ref 3.5–5.0)
Alkaline Phosphatase: 62 U/L (ref 38–126)
Anion gap: 13 (ref 5–15)
BUN: 12 mg/dL (ref 6–20)
CO2: 17 mmol/L — ABNORMAL LOW (ref 22–32)
Calcium: 8.9 mg/dL (ref 8.9–10.3)
Chloride: 104 mmol/L (ref 98–111)
Creatinine, Ser: 0.35 mg/dL — ABNORMAL LOW (ref 0.44–1.00)
GFR, Estimated: >60 mL/min (ref >60)
Glucose, Bld: 195 mg/dL — ABNORMAL HIGH (ref 70–99)
Potassium: 3.8 mmol/L (ref 3.5–5.1)
Sodium: 134 mmol/L — ABNORMAL LOW (ref 135–145)
Total Bilirubin: 0.7 mg/dL (ref 0.0–1.2)
Total Protein: 7.7 g/dL (ref 6.5–8.1)

## 2023-07-25 LAB — IRON AND TIBC
Iron: 54 ug/dL (ref 28–170)
Saturation Ratios: 10 % — ABNORMAL LOW (ref 10.4–31.8)
TIBC: 532 ug/dL — ABNORMAL HIGH (ref 250–450)
UIBC: 478 ug/dL

## 2023-07-25 LAB — FERRITIN: Ferritin: 8 ng/mL — ABNORMAL LOW (ref 11–307)

## 2023-07-25 LAB — HEMOGLOBIN A1C
Hgb A1c MFr Bld: 7.4 % — ABNORMAL HIGH (ref 4.8–5.6)
Mean Plasma Glucose: 165.68 mg/dL

## 2023-07-31 ENCOUNTER — Ambulatory Visit: Payer: Medicaid Other | Admitting: Nurse Practitioner

## 2023-07-31 ENCOUNTER — Encounter: Payer: Self-pay | Admitting: Nurse Practitioner

## 2023-07-31 VITALS — BP 129/86 | HR 79 | Temp 98.1°F | Ht <= 58 in | Wt 183.0 lb

## 2023-07-31 DIAGNOSIS — E66813 Obesity, class 3: Secondary | ICD-10-CM

## 2023-07-31 DIAGNOSIS — Z6841 Body Mass Index (BMI) 40.0 and over, adult: Secondary | ICD-10-CM | POA: Diagnosis not present

## 2023-07-31 DIAGNOSIS — E1165 Type 2 diabetes mellitus with hyperglycemia: Secondary | ICD-10-CM | POA: Diagnosis not present

## 2023-07-31 DIAGNOSIS — D649 Anemia, unspecified: Secondary | ICD-10-CM | POA: Diagnosis not present

## 2023-07-31 DIAGNOSIS — Z7985 Long-term (current) use of injectable non-insulin antidiabetic drugs: Secondary | ICD-10-CM

## 2023-07-31 DIAGNOSIS — Z7984 Long term (current) use of oral hypoglycemic drugs: Secondary | ICD-10-CM

## 2023-07-31 MED ORDER — TRULICITY 4.5 MG/0.5ML ~~LOC~~ SOAJ: 4.5000 mg | SUBCUTANEOUS | 0 refills | Status: DC

## 2023-07-31 NOTE — Progress Notes (Signed)
 Office: [phone]  /  Fax: [phone]  WEIGHT SUMMARY AND BIOMETRICS  Weight Lost Since Last Visit: 0lb  Weight Gained Since Last Visit: 1lb   Vitals Temp: 98.1 F (36.7 C) BP: 129/86 Pulse Rate: 79 SpO2: 96 %   Anthropometric Measurements Height: 4\' 7"  (1.397 m) Weight: 183 lb (83 kg) BMI (Calculated): 42.53 Weight at Last Visit: 182lb Weight Lost Since Last Visit: 0lb Weight Gained Since Last Visit: 1lb Starting Weight: 186lb Total Weight Loss (lbs): 3 lb (1.361 kg)   No data recorded Other Clinical Data Fasting: Yes Labs: No Today's Visit #: 26 Starting Date: 05/31/21     HPI  Chief Complaint: OBESITY  Robin Vazquez is here to discuss her progress with her obesity treatment plan. She is on the practicing portion control and making smarter food choices, such as increasing vegetables and decreasing simple carbohydrates and states she is following her eating plan approximately 70 % of the time. She states she is exercising 10 minutes 3 days per week.   Interval History:  Since last office visit she has gained 1 pound.  Upon asking how she is doing and what she is eating she states "I don't know".  She feels that she is doing well and is happy with how she is doing    Pharmacotherapy for weight loss: She is not currently taking medications  for medical weight loss.     Previous pharmacotherapy for medical weight loss:  Phentermine   Bariatric surgery:  Patient has not had bariatric surgery.  Pharmacotherapy for DMT2:  She is currently taking Farxiga 10mg , Trulicity 4.5 mg (started on 04/18/23) and glipizide 10mg  am and 5 mg in the evening.  Denies side effects.   Last A1c was 7.4 CBGs: 102-161 Episodes of hypoglycemia: no Taking Crestor 20mg  and enalapril 5 mg.   Last eye exam:  2023 She has tried Metformin and Ozempic  in the past.  -She stopped Metformin due to side effects of diarrhea.   -She stopped Ozempic due to side effects of vomiting and  reflux.    Lab Results  Component Value Date   HGBA1C 7.4 (H) 07/25/2023   HGBA1C 8.3 (H) 04/04/2023   HGBA1C 7.1 (H) 11/16/2022   Lab Results  Component Value Date   MICROALBUR 30 07/09/2023   LDLCALC 87 04/04/2023   CREATININE 0.35 (L) 07/25/2023    Anemia Saw GYN on 04/30/23.  She is not taking iron.  Had myomectomy exc fibroid tumors on 10/18/17. She reports "my cycles are not as heavy as they used to be". She saw oncology in 2012. Never had iron infusions. Never had a colonoscopy or EGD. Denies pica.    PHYSICAL EXAM:  Blood pressure 129/86, pulse 79, temperature 98.1 F (36.7 C), height 4\' 7"  (1.397 m), weight 183 lb (83 kg), last menstrual period 07/28/2023, SpO2 96%. Body mass index is 42.53 kg/m.  General: She is overweight, cooperative, alert, well developed, and in no acute distress. PSYCH: Has normal mood, affect and thought process.   Extremities: No edema.  Neurologic: No gross sensory or motor deficits. No tremors or fasciculations noted.    DIAGNOSTIC DATA REVIEWED:  BMET    Component Value Date/Time   NA 134 (L) 07/25/2023 1240   K 3.8 07/25/2023 1240   CL 104 07/25/2023 1240   CO2 17 (L) 07/25/2023 1240   GLUCOSE 195 (H) 07/25/2023 1240   BUN 12 07/25/2023 1240   CREATININE 0.35 (L) 07/25/2023 1240   CALCIUM 8.9 07/25/2023 1240  GFRNONAA >60 07/25/2023 1240   GFRAA >60 11/02/2019 1401   Lab Results  Component Value Date   HGBA1C 7.4 (H) 07/25/2023   HGBA1C 6.2 (H) 07/28/2015   No results found for: "INSULIN" Lab Results  Component Value Date   TSH 1.303 11/16/2022   CBC    Component Value Date/Time   WBC 7.2 07/25/2023 1240   RBC 5.34 (H) 07/25/2023 1240   HGB 12.8 07/25/2023 1240   HCT 41.5 07/25/2023 1240   PLT 448 (H) 07/25/2023 1240   MCV 77.7 (L) 07/25/2023 1240   MCH 24.0 (L) 07/25/2023 1240   MCHC 30.8 07/25/2023 1240   RDW 15.5 07/25/2023 1240   Iron Studies    Component Value Date/Time   IRON 54 07/25/2023 1240   TIBC  532 (H) 07/25/2023 1240   FERRITIN 8 (L) 07/25/2023 1240   IRONPCTSAT 10 (L) 07/25/2023 1240   Lipid Panel     Component Value Date/Time   CHOL 136 04/04/2023 1438   TRIG 39 04/04/2023 1438   HDL 41 04/04/2023 1438   CHOLHDL 3.3 04/04/2023 1438   VLDL 8 04/04/2023 1438   LDLCALC 87 04/04/2023 1438   Hepatic Function Panel     Component Value Date/Time   PROT 7.7 07/25/2023 1240   ALBUMIN 4.0 07/25/2023 1240   AST 15 07/25/2023 1240   ALT 22 07/25/2023 1240   ALKPHOS 62 07/25/2023 1240   BILITOT 0.7 07/25/2023 1240      Component Value Date/Time   TSH 1.303 11/16/2022 1303   Nutritional Lab Results  Component Value Date   VD25OH 43.85 04/04/2023   VD25OH See Scanned report in [place] Link 07/10/2022   VD25OH 53.97 12/05/2021     ASSESSMENT AND PLAN  TREATMENT PLAN FOR OBESITY:  Recommended Dietary Goals  Robin Vazquez is currently in the action stage of change. As such, her goal is to continue weight management plan. She has agreed to track and will review macros at her next visit.  Behavioral Intervention  We discussed the following Behavioral Modification Strategies today: increasing lean protein intake to established goals, decreasing simple carbohydrates , increasing water intake , work on tracking and journaling calories using tracking application, and continue to work on maintaining a reduced calorie state, getting the recommended amount of protein, incorporating whole foods, making healthy choices, staying well hydrated and practicing mindfulness when eating..  Additional resources provided today: NA  Recommended Physical Activity Goals  Robin Vazquez has been advised to work up to 150 minutes of moderate intensity aerobic activity a week and strengthening exercises 2-3 times per week for cardiovascular health, weight loss maintenance and preservation of muscle mass.   She has agreed to Think about enjoyable ways to increase daily physical activity and  overcoming barriers to exercise, Increase physical activity in their day and reduce sedentary time (increase NEAT)., Increase the intensity, frequency or duration of strengthening exercises , and Increase the intensity, frequency or duration of aerobic exercises       ASSOCIATED CONDITIONS ADDRESSED TODAY  Action/Plan  Type 2 diabetes mellitus with hyperglycemia, without long-term current use of insulin (HCC) -     Trulicity; Inject 4.5 mg into the skin once a week.  Dispense: 2 mL; Refill: 0  Anemia, unspecified type Offered again to refer patient to hematology and she refused.  To start an iron daily.  Class 3 severe obesity due to excess calories with serious comorbidity and body mass index (BMI) of 40.0 to 44.9 in adult [facility])  Labs reviewed in chart with patient from 07/25/23    Return in about 4 weeks (around 08/28/2023).Marland Kitchen She was informed of the importance of frequent follow up visits to maximize her success with intensive lifestyle modifications for her multiple health conditions.   ATTESTASTION STATEMENTS:  Reviewed by clinician on day of visit: allergies, medications, problem list, medical history, surgical history, family history, social history, and previous encounter notes.      Theodis Sato. Gertrude Tarbet FNP-C

## 2023-08-05 ENCOUNTER — Other Ambulatory Visit: Payer: Self-pay | Admitting: Cardiology

## 2023-08-05 DIAGNOSIS — E1165 Type 2 diabetes mellitus with hyperglycemia: Secondary | ICD-10-CM

## 2023-08-21 ENCOUNTER — Other Ambulatory Visit: Payer: Self-pay

## 2023-08-22 MED ORDER — ACCU-CHEK SOFTCLIX LANCETS MISC: 1.0000 | Freq: Every day | 0 refills | Status: AC

## 2023-08-26 ENCOUNTER — Other Ambulatory Visit: Payer: Self-pay

## 2023-08-26 DIAGNOSIS — E119 Type 2 diabetes mellitus without complications: Secondary | ICD-10-CM

## 2023-08-26 MED ORDER — ACCU-CHEK AVIVA PLUS VI STRP: 1.0000 | ORAL_STRIP | Freq: Every day | 3 refills | Status: AC

## 2023-08-28 ENCOUNTER — Ambulatory Visit: Admitting: Nurse Practitioner

## 2023-09-11 ENCOUNTER — Telehealth: Payer: Self-pay | Admitting: Internal Medicine

## 2023-09-11 NOTE — Telephone Encounter (Signed)
 Patient left VM requesting something be called in for a UTI because she is having back pain and thinks she has a UTI.   Need to call patient and see if she can come by and give a urine specimen.

## 2023-09-17 ENCOUNTER — Ambulatory Visit: Admitting: Nurse Practitioner

## 2023-09-17 ENCOUNTER — Encounter: Payer: Self-pay | Admitting: Nurse Practitioner

## 2023-09-17 ENCOUNTER — Telehealth: Payer: Self-pay

## 2023-09-17 VITALS — BP 136/93 | HR 80 | Temp 98.4°F | Ht <= 58 in | Wt 184.0 lb

## 2023-09-17 DIAGNOSIS — E1165 Type 2 diabetes mellitus with hyperglycemia: Secondary | ICD-10-CM

## 2023-09-17 DIAGNOSIS — Z6841 Body Mass Index (BMI) 40.0 and over, adult: Secondary | ICD-10-CM

## 2023-09-17 DIAGNOSIS — R300 Dysuria: Secondary | ICD-10-CM

## 2023-09-17 DIAGNOSIS — E66813 Obesity, class 3: Secondary | ICD-10-CM

## 2023-09-17 DIAGNOSIS — Z7984 Long term (current) use of oral hypoglycemic drugs: Secondary | ICD-10-CM

## 2023-09-17 DIAGNOSIS — Z7985 Long-term (current) use of injectable non-insulin antidiabetic drugs: Secondary | ICD-10-CM

## 2023-09-17 MED ORDER — TIRZEPATIDE 2.5 MG/0.5ML ~~LOC~~ SOAJ: 2.5000 mg | SUBCUTANEOUS | 0 refills | Status: DC

## 2023-09-17 NOTE — Telephone Encounter (Signed)
 PA submitted through Cover My Meds for [employer] . Awaiting insurance determination. Key: [id number]

## 2023-09-17 NOTE — Progress Notes (Signed)
 Office: [phone]  /  Fax: [phone]  WEIGHT SUMMARY AND BIOMETRICS  Weight Lost Since Last Visit: 0lb  Weight Gained Since Last Visit: 1lb   Vitals Temp: 98.4 F (36.9 C) BP: (!) 136/93 Pulse Rate: 80 SpO2: 100 %   Anthropometric Measurements Height: 4\' 7"  (1.397 m) Weight: 184 lb (83.5 kg) BMI (Calculated): 42.77 Weight at Last Visit: 183lb Weight Lost Since Last Visit: 0lb Weight Gained Since Last Visit: 1lb Starting Weight: 186lb Total Weight Loss (lbs): 2 lb (0.907 kg)   No data recorded Other Clinical Data Fasting: No Labs: No Today's Visit #: 27 Starting Date: 05/31/21     HPI  Chief Complaint: OBESITY  Robin Vazquez is here to discuss her progress with her obesity treatment plan. She is on the practicing portion control and making smarter food choices, such as increasing vegetables and decreasing simple carbohydrates and states she is following her eating plan approximately 50 % of the time. She states she is exercising 10 minutes 2 days per week.   Interval History:  Since last office visit she has gained 1 pound.  Upon asking what she is eating, she states salmon, chicken, and trying to reduce starches.  She states this has a been a bad month.  She is drinking water and snapples.    Pharmacotherapy for weight loss: She is not currently taking medications  for medical weight loss.     Previous pharmacotherapy for medical weight loss:  Phentermine   Bariatric surgery:  Patient has not had bariatric surgery.  Pharmacotherapy for DMT2:   She is currently taking Farxiga  10mg , Trulicity  4.5 mg (started on 04/18/23) and glipizide  10mg  am and 5 mg in the evening.  Denies side effects.   Last A1c was 7.4 CBGs: she hasn't been checking her CBGs at home due to running out of supplies.   Episodes of hypoglycemia: no Taking Crestor  20mg  and enalapril  5 mg.   Last eye exam:  2023-discussed extensively the need to schedule an eye exam She has tried  Metformin and Ozempic   in the past.  -She stopped Metformin due to side effects of diarrhea.   -She stopped Ozempic  due to side effects of vomiting and reflux.   Lab Results  Component Value Date   HGBA1C 7.4 (H) 07/25/2023   HGBA1C 8.3 (H) 04/04/2023   HGBA1C 7.1 (H) 11/16/2022   Lab Results  Component Value Date   MICROALBUR 30 07/09/2023   LDLCALC 87 04/04/2023   CREATININE 0.35 (L) 07/25/2023    Dysuria Reports for the past 2 weeks.  Denies fever, chills, nausea, vomiting.    PHYSICAL EXAM:  Blood pressure (!) 136/93, pulse 80, temperature 98.4 F (36.9 C), height 4\' 7"  (1.397 m), weight 184 lb (83.5 kg), last menstrual period 09/17/2023, SpO2 100%. Body mass index is 42.77 kg/m.  General: She is overweight, cooperative, alert, well developed, and in no acute distress. PSYCH: Has normal mood, affect and thought process.   Extremities: No edema.  Neurologic: No gross sensory or motor deficits. No tremors or fasciculations noted.    DIAGNOSTIC DATA REVIEWED:  BMET    Component Value Date/Time   NA 134 (L) 07/25/2023 1240   K 3.8 07/25/2023 1240   CL 104 07/25/2023 1240   CO2 17 (L) 07/25/2023 1240   GLUCOSE 195 (H) 07/25/2023 1240   BUN 12 07/25/2023 1240   CREATININE 0.35 (L) 07/25/2023 1240   CALCIUM  8.9 07/25/2023 1240   GFRNONAA >60 07/25/2023 1240   GFRAA >60 11/02/2019  1401   Lab Results  Component Value Date   HGBA1C 7.4 (H) 07/25/2023   HGBA1C 6.2 (H) 07/28/2015   No results found for: "INSULIN " Lab Results  Component Value Date   TSH 1.303 11/16/2022   CBC    Component Value Date/Time   WBC 7.2 07/25/2023 1240   RBC 5.34 (H) 07/25/2023 1240   HGB 12.8 07/25/2023 1240   HCT 41.5 07/25/2023 1240   PLT 448 (H) 07/25/2023 1240   MCV 77.7 (L) 07/25/2023 1240   MCH 24.0 (L) 07/25/2023 1240   MCHC 30.8 07/25/2023 1240   RDW 15.5 07/25/2023 1240   Iron Studies    Component Value Date/Time   IRON 54 07/25/2023 1240   TIBC 532 (H) 07/25/2023  1240   FERRITIN 8 (L) 07/25/2023 1240   IRONPCTSAT 10 (L) 07/25/2023 1240   Lipid Panel     Component Value Date/Time   CHOL 136 04/04/2023 1438   TRIG 39 04/04/2023 1438   HDL 41 04/04/2023 1438   CHOLHDL 3.3 04/04/2023 1438   VLDL 8 04/04/2023 1438   LDLCALC 87 04/04/2023 1438   Hepatic Function Panel     Component Value Date/Time   PROT 7.7 07/25/2023 1240   ALBUMIN 4.0 07/25/2023 1240   AST 15 07/25/2023 1240   ALT 22 07/25/2023 1240   ALKPHOS 62 07/25/2023 1240   BILITOT 0.7 07/25/2023 1240      Component Value Date/Time   TSH 1.303 11/16/2022 1303   Nutritional Lab Results  Component Value Date   VD25OH 43.85 04/04/2023   VD25OH See Scanned report in [place] Link 07/10/2022   VD25OH 53.97 12/05/2021     ASSESSMENT AND PLAN  TREATMENT PLAN FOR OBESITY:  Recommended Dietary Goals  Robin Vazquez is currently in the action stage of change. As such, her goal is to continue weight management plan. I've encouraged her to track and will review macros at next visit.  I've encouraged her to stop sugary drinks and increase water intake.  We have discussed nutrition plans, tracking, exercise, increasing water intake, increasing protein intake and reducing carbs on multiple occasions due to her comorbidities and risks associated with diabetes, obesity, etc.   Behavioral Intervention  We discussed the following Behavioral Modification Strategies today: increasing lean protein intake to established goals, decreasing simple carbohydrates , increasing vegetables, increasing fiber rich foods, increasing water intake , work on tracking and journaling calories using tracking application, and continue to work on maintaining a reduced calorie state, getting the recommended amount of protein, incorporating whole foods, making healthy choices, staying well hydrated and practicing mindfulness when eating..  Additional resources provided today: NA  Recommended Physical Activity  Goals  Robin Vazquez has been advised to work up to 150 minutes of moderate intensity aerobic activity a week and strengthening exercises 2-3 times per week for cardiovascular health, weight loss maintenance and preservation of muscle mass.   She has agreed to Think about enjoyable ways to increase daily physical activity and overcoming barriers to exercise, Increase physical activity in their day and reduce sedentary time (increase NEAT)., and Work on scheduling and tracking physical activity.    ASSOCIATED CONDITIONS ADDRESSED TODAY  Action/Plan  Type 2 diabetes mellitus with hyperglycemia, without long-term current use of insulin  (HCC) -    Stop Trulicity , start Tirzepatide ; Inject 2.5 mg into the skin once a week.  Dispense: 2 mL; Refill: 0 for better A1c control.  Side effects discussed.  Good blood sugar control is important to decrease the likelihood  of diabetic complications such as nephropathy, neuropathy, limb loss, blindness, coronary artery disease, and death. Intensive lifestyle modification including diet, exercise and weight loss are the first line of treatment for diabetes.    Dysuria -     UA/M w/rflx Culture, Routine  Class 3 severe obesity due to excess calories with serious comorbidity and body mass index (BMI) of 40.0 to 44.9 in adult [facility])     Will recheck labs end of May, 1st of June.    Return in about 4 weeks (around 10/15/2023).Aaron Aas She was informed of the importance of frequent follow up visits to maximize her success with intensive lifestyle modifications for her multiple health conditions.   ATTESTASTION STATEMENTS:  Reviewed by clinician on day of visit: allergies, medications, problem list, medical history, surgical history, family history, social history, and previous encounter notes.      Crist Dominion. Trinaty Bundrick FNP-C

## 2023-09-18 NOTE — Telephone Encounter (Signed)
 Request Reference Number: [id number]. M[employer]  INJ 2.5/0.5 is approved through 09/16/2024

## 2023-09-21 LAB — MICROSCOPIC EXAMINATION
Bacteria, UA: NONE SEEN
Casts: NONE SEEN /LPF
RBC, Urine: >30 /HPF — AB (ref 0–2)

## 2023-09-21 LAB — UA/M W/RFLX CULTURE, ROUTINE
Bilirubin, UA: NEGATIVE
Leukocytes,UA: NEGATIVE
Nitrite, UA: NEGATIVE
Specific Gravity, UA: >=1.03 — AB (ref 1.005–1.030)
Urobilinogen, Ur: 0.2 mg/dL (ref 0.2–1.0)
pH, UA: 5.5 (ref 5.0–7.5)

## 2023-09-21 LAB — URINE CULTURE, REFLEX

## 2023-09-24 ENCOUNTER — Encounter: Payer: Self-pay | Admitting: Nurse Practitioner

## 2023-09-24 ENCOUNTER — Other Ambulatory Visit: Payer: Self-pay | Admitting: Internal Medicine

## 2023-09-24 MED ORDER — ACCU-CHEK GUIDE TEST VI STRP: 1.0000 | ORAL_STRIP | Freq: Three times a day (TID) | 11 refills | Status: AC

## 2023-10-07 ENCOUNTER — Ambulatory Visit: Payer: Medicaid Other | Admitting: Cardiology

## 2023-10-15 ENCOUNTER — Telehealth (INDEPENDENT_AMBULATORY_CARE_PROVIDER_SITE_OTHER): Admitting: Nurse Practitioner

## 2023-10-15 DIAGNOSIS — Z7984 Long term (current) use of oral hypoglycemic drugs: Secondary | ICD-10-CM

## 2023-10-15 DIAGNOSIS — Z6841 Body Mass Index (BMI) 40.0 and over, adult: Secondary | ICD-10-CM

## 2023-10-15 DIAGNOSIS — R8290 Unspecified abnormal findings in urine: Secondary | ICD-10-CM | POA: Diagnosis not present

## 2023-10-15 DIAGNOSIS — Z7985 Long-term (current) use of injectable non-insulin antidiabetic drugs: Secondary | ICD-10-CM

## 2023-10-15 DIAGNOSIS — E1165 Type 2 diabetes mellitus with hyperglycemia: Secondary | ICD-10-CM

## 2023-10-15 MED ORDER — TIRZEPATIDE 5 MG/0.5ML ~~LOC~~ SOAJ: 5.0000 mg | SUBCUTANEOUS | 0 refills | Status: DC

## 2023-10-15 NOTE — Progress Notes (Addendum)
 Office: [phone]  /  Fax: [phone]  WEIGHT SUMMARY AND BIOMETRICS  TeleHealth Visit:  This visit was completed with telemedicine (audio/video) technology. Robin Vazquez has verbally consented to this TeleHealth visit. The patient is located at home, the provider is located at [facility]. The participants in this visit include the listed provider and patient. The visit was conducted today via MyChart video.  HPI  Chief Complaint: OBESITY  Robin Vazquez is here to discuss her progress with her obesity treatment plan. She is on the practicing portion control and making smarter food choices, such as increasing vegetables and decreasing simple carbohydrates and states she is following her eating plan approximately 65 % of the time. She states she is exercising 10-15 minutes 3 days per week.   Interval History:  Reported weight 182lbs-2 lbs lost since last visit. She is not skipping meals and is eating a protein with each meal.  She is snacking on fruit and sugar free applesauce. She is drinking water and sugar free juices.  She went one week without drinking anything but water. Notes her sugars were lower when she stopped juices, has increased with re adding back juice. She is walking 10-15 mins 3 days per week.   Pharmacotherapy for weight loss: She is not currently taking medications for medical weight loss.     Previous pharmacotherapy for medical weight loss:  Phentermine   Bariatric surgery:  Patient has not had bariatric surgery.  Pharmacotherapy for DMT2:   She is currently taking Farxiga  10mg , Mounjaro  2.5 mg (x 3 injections) and glipizide  10mg  am and 5 mg in the evening.  Denies side effects.   Last A1c was 7.4 CBGs: 100-179 Episodes of hypoglycemia: Denies Taking Crestor  20mg  and enalapril  5 mg.   Last eye exam:  2023-discussed extensively the need to schedule an eye exam She has tried Metformin and Ozempic   in the past.  -She stopped Metformin due to side effects of diarrhea.    -She stopped Ozempic  due to side effects of vomiting and reflux.   Mounjaro  approved throught 09/16/24  Lab Results  Component Value Date   HGBA1C 7.4 (H) 07/25/2023   HGBA1C 8.3 (H) 04/04/2023   HGBA1C 7.1 (H) 11/16/2022   Lab Results  Component Value Date   MICROALBUR 30 07/09/2023   LDLCALC 87 04/04/2023   CREATININE 0.35 (L) 07/25/2023    Abnormal UA Patient denies dysuria.  Was on menses. Planning to see her PCP on 10/29/23  PHYSICAL EXAM:  Last menstrual period 09/17/2023. There is no height or weight on file to calculate BMI.  General: She is overweight, cooperative, alert, well developed, and in no acute distress. PSYCH: Has normal mood, affect and thought process.   Extremities: No edema.  Neurologic: No gross sensory or motor deficits. No tremors or fasciculations noted.    DIAGNOSTIC DATA REVIEWED:  BMET    Component Value Date/Time   NA 134 (L) 07/25/2023 1240   K 3.8 07/25/2023 1240   CL 104 07/25/2023 1240   CO2 17 (L) 07/25/2023 1240   GLUCOSE 195 (H) 07/25/2023 1240   BUN 12 07/25/2023 1240   CREATININE 0.35 (L) 07/25/2023 1240   CALCIUM  8.9 07/25/2023 1240   GFRNONAA >60 07/25/2023 1240   GFRAA >60 11/02/2019 1401   Lab Results  Component Value Date   HGBA1C 7.4 (H) 07/25/2023   HGBA1C 6.2 (H) 07/28/2015   No results found for: "INSULIN " Lab Results  Component Value Date   TSH 1.303 11/16/2022   CBC  Component Value Date/Time   WBC 7.2 07/25/2023 1240   RBC 5.34 (H) 07/25/2023 1240   HGB 12.8 07/25/2023 1240   HCT 41.5 07/25/2023 1240   PLT 448 (H) 07/25/2023 1240   MCV 77.7 (L) 07/25/2023 1240   MCH 24.0 (L) 07/25/2023 1240   MCHC 30.8 07/25/2023 1240   RDW 15.5 07/25/2023 1240   Iron Studies    Component Value Date/Time   IRON 54 07/25/2023 1240   TIBC 532 (H) 07/25/2023 1240   FERRITIN 8 (L) 07/25/2023 1240   IRONPCTSAT 10 (L) 07/25/2023 1240   Lipid Panel     Component Value Date/Time   CHOL 136 04/04/2023 1438    TRIG 39 04/04/2023 1438   HDL 41 04/04/2023 1438   CHOLHDL 3.3 04/04/2023 1438   VLDL 8 04/04/2023 1438   LDLCALC 87 04/04/2023 1438   Hepatic Function Panel     Component Value Date/Time   PROT 7.7 07/25/2023 1240   ALBUMIN 4.0 07/25/2023 1240   AST 15 07/25/2023 1240   ALT 22 07/25/2023 1240   ALKPHOS 62 07/25/2023 1240   BILITOT 0.7 07/25/2023 1240      Component Value Date/Time   TSH 1.303 11/16/2022 1303   Nutritional Lab Results  Component Value Date   VD25OH 43.85 04/04/2023   VD25OH See Scanned report in [place] [place] Link 07/10/2022   VD25OH 53.97 12/05/2021     ASSESSMENT AND PLAN  TREATMENT PLAN FOR OBESITY:  Recommended Dietary Goals  Robin Vazquez is currently in the action stage of change. As such, her goal is to continue weight management plan. She has agreed to practicing portion control and making smarter food choices, such as increasing vegetables and decreasing simple carbohydrates.  Behavioral Intervention  We discussed the following Behavioral Modification Strategies today: increasing lean protein intake to established goals, decreasing simple carbohydrates , increasing vegetables, increasing fiber rich foods, increasing water intake , and continue to work on maintaining a reduced calorie state, getting the recommended amount of protein, incorporating whole foods, making healthy choices, staying well hydrated and practicing mindfulness when eating..  Additional resources provided today: NA  Recommended Physical Activity Goals  Robin Vazquez has been advised to work up to 150 minutes of moderate intensity aerobic activity a week and strengthening exercises 2-3 times per week for cardiovascular health, weight loss maintenance and preservation of muscle mass.   She has agreed to Think about enjoyable ways to increase daily physical activity and overcoming barriers to exercise, Increase physical activity in their day and reduce sedentary time (increase NEAT).,  Increase the intensity, frequency or duration of strengthening exercises , and Increase the intensity, frequency or duration of aerobic exercises    ASSOCIATED CONDITIONS ADDRESSED TODAY  Action/Plan  Abnormal urinalysis Keep appt with PCP.  To repeat UA at PCP's office  Type 2 diabetes mellitus with hyperglycemia, without long-term current use of insulin  (HCC) -     Increase Tirzepatide ; Inject 5 mg into the skin once a week.  Dispense: 2 mL; Refill: 0. Side effects discussed Will decrease Glipizide  to 5mg  BID.  Monitor for hypoglycemia.  To contact the office with any low blood sugars.  Patient verbalizes understanding.   To schedule eye exam  Keep appt with PCP on 10/29/23   Will obtain labs at next visit if not obtained at PCP's office  Return in about 4 weeks (around 11/12/2023).Robin Vazquez She was informed of the importance of frequent follow up visits to maximize her success with intensive lifestyle modifications for her multiple  health conditions.   ATTESTASTION STATEMENTS:  Reviewed by clinician on day of visit: allergies, medications, problem list, medical history, surgical history, family history, social history, and previous encounter notes.      Robin Dominion. Stclair Szymborski FNP-C

## 2023-10-25 ENCOUNTER — Other Ambulatory Visit: Payer: Self-pay

## 2023-10-25 DIAGNOSIS — I10 Essential (primary) hypertension: Secondary | ICD-10-CM

## 2023-10-25 DIAGNOSIS — E119 Type 2 diabetes mellitus without complications: Secondary | ICD-10-CM

## 2023-10-25 DIAGNOSIS — E782 Mixed hyperlipidemia: Secondary | ICD-10-CM

## 2023-10-29 ENCOUNTER — Ambulatory Visit: Admitting: Cardiology

## 2023-11-01 ENCOUNTER — Ambulatory Visit: Admitting: Cardiology

## 2023-11-04 ENCOUNTER — Ambulatory Visit: Payer: Self-pay | Admitting: Internal Medicine

## 2023-11-04 ENCOUNTER — Other Ambulatory Visit (HOSPITAL_COMMUNITY)
Admission: RE | Admit: 2023-11-04 | Discharge: 2023-11-04 | Disposition: A | Discharge status: Home / Self Care | Source: Ambulatory Visit | Attending: Nurse Practitioner | Admitting: Nurse Practitioner

## 2023-11-04 DIAGNOSIS — I10 Essential (primary) hypertension: Secondary | ICD-10-CM | POA: Insufficient documentation

## 2023-11-04 DIAGNOSIS — E782 Mixed hyperlipidemia: Secondary | ICD-10-CM | POA: Insufficient documentation

## 2023-11-04 DIAGNOSIS — E119 Type 2 diabetes mellitus without complications: Secondary | ICD-10-CM | POA: Insufficient documentation

## 2023-11-04 LAB — COMPREHENSIVE METABOLIC PANEL WITH GFR
ALT: 17 U/L (ref 0–44)
AST: 16 U/L (ref 15–41)
Albumin: 4.3 g/dL (ref 3.5–5.0)
Alkaline Phosphatase: 66 U/L (ref 38–126)
Anion gap: 12 (ref 5–15)
BUN: 14 mg/dL (ref 6–20)
CO2: 17 mmol/L — ABNORMAL LOW (ref 22–32)
Calcium: 9 mg/dL (ref 8.9–10.3)
Chloride: 106 mmol/L (ref 98–111)
Creatinine, Ser: 0.37 mg/dL — ABNORMAL LOW (ref 0.44–1.00)
GFR, Estimated: >60 mL/min (ref >60)
Glucose, Bld: 137 mg/dL — ABNORMAL HIGH (ref 70–99)
Potassium: 3.7 mmol/L (ref 3.5–5.1)
Sodium: 135 mmol/L (ref 135–145)
Total Bilirubin: 0.8 mg/dL (ref 0.0–1.2)
Total Protein: 8.2 g/dL — ABNORMAL HIGH (ref 6.5–8.1)

## 2023-11-04 LAB — CBC WITH DIFFERENTIAL/PLATELET
Abs Immature Granulocytes: 0.03 10*3/uL (ref 0.00–0.07)
Basophils Absolute: 0.1 10*3/uL (ref 0.0–0.1)
Basophils Relative: 1 %
Eosinophils Absolute: 0.1 10*3/uL (ref 0.0–0.5)
Eosinophils Relative: 2 %
HCT: 42.6 % (ref 36.0–46.0)
Hemoglobin: 13.3 g/dL (ref 12.0–15.0)
Immature Granulocytes: 0 %
Lymphocytes Relative: 33 %
Lymphs Abs: 2.5 10*3/uL (ref 0.7–4.0)
MCH: 25.5 pg — ABNORMAL LOW (ref 26.0–34.0)
MCHC: 31.2 g/dL (ref 30.0–36.0)
MCV: 81.6 fL (ref 80.0–100.0)
Monocytes Absolute: 0.5 10*3/uL (ref 0.1–1.0)
Monocytes Relative: 6 %
Neutro Abs: 4.5 10*3/uL (ref 1.7–7.7)
Neutrophils Relative %: 58 %
Platelets: 413 10*3/uL — ABNORMAL HIGH (ref 150–400)
RBC: 5.22 MIL/uL — ABNORMAL HIGH (ref 3.87–5.11)
RDW: 15.6 % — ABNORMAL HIGH (ref 11.5–15.5)
WBC: 7.7 10*3/uL (ref 4.0–10.5)
nRBC: 0 % (ref 0.0–0.2)

## 2023-11-04 LAB — LIPID PANEL
Cholesterol: 107 mg/dL (ref 0–200)
HDL: 39 mg/dL — ABNORMAL LOW (ref >40)
LDL Cholesterol: 60 mg/dL (ref 0–99)
Total CHOL/HDL Ratio: 2.7 ratio
Triglycerides: 42 mg/dL (ref <150)
VLDL: 8 mg/dL (ref 0–40)

## 2023-11-04 LAB — TSH: TSH: 1.358 u[IU]/mL (ref 0.350–4.500)

## 2023-11-05 LAB — HEMOGLOBIN A1C
Hgb A1c MFr Bld: 7.4 % — ABNORMAL HIGH (ref 4.8–5.6)
Mean Plasma Glucose: 166 mg/dL

## 2023-11-06 ENCOUNTER — Telehealth: Payer: Self-pay | Admitting: Internal Medicine

## 2023-11-06 ENCOUNTER — Other Ambulatory Visit: Payer: Self-pay | Admitting: Cardiology

## 2023-11-06 MED ORDER — FLUCONAZOLE 150 MG PO TABS: 150.0000 mg | ORAL_TABLET | ORAL | 3 refills | Status: DC

## 2023-11-06 NOTE — Telephone Encounter (Signed)
 Patient left VM that she is having vaginal itching and irritation, along with dysuria. Has an appointment this Friday but wants to know if we can send something in for her to get started on before then. Please advise.

## 2023-11-07 ENCOUNTER — Telehealth: Payer: Self-pay

## 2023-11-07 ENCOUNTER — Other Ambulatory Visit: Payer: Self-pay

## 2023-11-07 DIAGNOSIS — B009 Herpesviral infection, unspecified: Secondary | ICD-10-CM

## 2023-11-07 MED ORDER — VALACYCLOVIR HCL 1 G PO TABS: 1000.0000 mg | ORAL_TABLET | Freq: Two times a day (BID) | ORAL | 3 refills | Status: DC

## 2023-11-07 NOTE — Telephone Encounter (Signed)
 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  She is requesting refill of her valacyclovir  prescribed by Dr. Denman Fischer.  Refill sent to [facility] on [address] in [place].   Liane Redman RN

## 2023-11-08 ENCOUNTER — Ambulatory Visit: Admitting: Cardiology

## 2023-11-12 ENCOUNTER — Ambulatory Visit: Admitting: Nurse Practitioner

## 2023-11-15 ENCOUNTER — Ambulatory Visit: Admitting: Cardiology

## 2023-11-15 ENCOUNTER — Encounter: Payer: Self-pay | Admitting: Cardiology

## 2023-11-15 VITALS — BP 131/84 | HR 91 | Ht <= 58 in | Wt 183.6 lb

## 2023-11-15 DIAGNOSIS — I10 Essential (primary) hypertension: Secondary | ICD-10-CM | POA: Diagnosis not present

## 2023-11-15 DIAGNOSIS — E782 Mixed hyperlipidemia: Secondary | ICD-10-CM

## 2023-11-15 DIAGNOSIS — E119 Type 2 diabetes mellitus without complications: Secondary | ICD-10-CM | POA: Diagnosis not present

## 2023-11-15 DIAGNOSIS — E559 Vitamin D deficiency, unspecified: Secondary | ICD-10-CM

## 2023-11-15 NOTE — Progress Notes (Signed)
 Established Patient Office Visit  Subjective:  Patient ID: Robin Vazquez, female    DOB: November 06, 1985  Age: 38 y.o. MRN: [id number]  Chief Complaint  Patient presents with   Follow-up    3 month follow up    Patient in office for 3 months follow up, discuss recent lab work. Patient doing well.  No new complaints today. Eye exam scheduled for 7/28.  Discussed recent lab work, stable. Continue same medications.    No other concerns at this time.   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    Diabetes mellitus without complication (HCC)    Herpes    genitial   History of uterine fibroid 09/2015   Hyperlipemia    Hypertension    Obesity    Other fatigue     Past Surgical History:  Procedure Laterality Date   joint     on legs and feet   MYOMECTOMY ABDOMINAL APPROACH  10/18/2017   performed at [facility]   UTERINE FIBROID SURGERY      Social History   Socioeconomic History   Marital status: Single    Spouse name: Not on file   Number of children: Not on file   Years of education: Not on file   Highest education level: Not on file  Occupational History   Occupation: Disabled    Employer: Not employed  Tobacco Use   Smoking status: Never   Smokeless tobacco: Never  Vaping Use   Vaping status: Never Used  Substance and Sexual Activity   Alcohol use: No   Drug use: No   Sexual activity: Not Currently    Birth control/protection: None  Other Topics Concern   Not on file  Social History Narrative   Not on file   Social Drivers of Health   Financial Resource Strain: Not on file  Food Insecurity: Not on file  Transportation Needs: Not on file  Physical Activity: Not on file  Stress: Not on file  Social Connections: Not on file  Intimate Partner Violence: Not on file    Family History  Adopted: Yes   Family history unknown: Yes    No Known Allergies  Outpatient Medications Prior to Visit  Medication Sig   Accu-Chek Softclix Lancets lancets 1 each by Other route daily. Use to check blood sugar once daily.   amLODipine  (NORVASC ) 5 MG tablet Take 1 tablet (5 mg total) by mouth daily.   Blood Glucose Monitoring Suppl (ACCU-CHEK AVIVA PLUS) w/Device KIT 1 each by Other route daily. Use to check blood sugar once daily.   enalapril  (VASOTEC ) 5 MG tablet Take 1 tablet (5 mg total) by mouth daily.   FARXIGA  10 MG TABS tablet TAKE 1 TABLET BY MOUTH IN THE MORNING   glucose blood (ACCU-CHEK AVIVA PLUS) test strip 1 each by Other route daily. Use to check blood pressure once daily.   glucose blood (ACCU-CHEK GUIDE TEST) test strip 1 each by Other route in the morning, at noon, and at bedtime. Use as instructed   rosuvastatin  (CRESTOR ) 20 MG tablet Take 1 tablet (20 mg total) by mouth every evening.   tirzepatide  (MOUNJARO ) 5 MG/0.5ML Pen Inject 5 mg into the skin once a week.   glipiZIDE  (GLUCOTROL ) 10 MG tablet Take 1 tab po in morning and half a tab at night (Patient taking differently: Take 5  mg by mouth in the morning and at bedtime. Take 1 tab po in morning and half a tab at night)   nystatin  cream (MYCOSTATIN ) Apply 1 Application topically 2 (two) times daily. (Patient not taking: Reported on 11/15/2023)   tirzepatide  (MOUNJARO ) 2.5 MG/0.5ML Pen Inject 2.5 mg into the skin once a week. (Patient not taking: Reported on 11/15/2023)   [DISCONTINUED] enalapril  (VASOTEC ) 20 MG tablet TAKE 1 TABLET BY MOUTH IN THE MORNING FOR BLOOD PRESSURE (Patient not taking: Reported on 11/15/2023)   [DISCONTINUED] fluconazole  (DIFLUCAN ) 150 MG tablet Take 1 tablet (150 mg total) by mouth every 3 (three) days. For  up to three doses (Patient not taking: Reported on 11/15/2023)   [DISCONTINUED] valACYclovir  (VALTREX ) 1000 MG tablet Take 1 tablet (1,000 mg total) by mouth 2 (two) times daily. (Patient not taking:  Reported on 11/15/2023)   No facility-administered medications prior to visit.    Review of Systems  Constitutional: Negative.   HENT: Negative.    Eyes: Negative.   Respiratory: Negative.  Negative for shortness of breath.   Cardiovascular: Negative.  Negative for chest pain.  Gastrointestinal: Negative.  Negative for abdominal pain, constipation and diarrhea.  Genitourinary: Negative.   Musculoskeletal:  Negative for joint pain and myalgias.  Skin: Negative.   Neurological: Negative.  Negative for dizziness and headaches.  Endo/Heme/Allergies: Negative.   All other systems reviewed and are negative.      Objective:   BP 131/84   Pulse 91   Ht 4' 7 (1.397 m)   Wt 183 lb 9.6 oz (83.3 kg)   SpO2 95%   BMI 42.67 kg/m   Vitals:   11/15/23 1327  BP: 131/84  Pulse: 91  Height: 4' 7 (1.397 m)  Weight: 183 lb 9.6 oz (83.3 kg)  SpO2: 95%  BMI (Calculated): 42.67    Physical Exam Vitals and nursing note reviewed.  Constitutional:      Appearance: Normal appearance. She is normal weight.  HENT:     Head: Normocephalic and atraumatic.     Nose: Nose normal.     Mouth/Throat:     Mouth: Mucous membranes are moist.   Eyes:     Extraocular Movements: Extraocular movements intact.     Conjunctiva/sclera: Conjunctivae normal.     Pupils: Pupils are equal, round, and reactive to light.    Cardiovascular:     Rate and Rhythm: Normal rate and regular rhythm.     Pulses: Normal pulses.     Heart sounds: Normal heart sounds.  Pulmonary:     Effort: Pulmonary effort is normal.     Breath sounds: Normal breath sounds.  Abdominal:     General: Abdomen is flat. Bowel sounds are normal.     Palpations: Abdomen is soft.   Musculoskeletal:        General: Normal range of motion.     Cervical back: Normal range of motion.   Skin:    General: Skin is warm and dry.   Neurological:     General: No focal deficit present.     Mental Status: She is alert and oriented to  person, place, and time.   Psychiatric:        Mood and Affect: Mood normal.        Behavior: Behavior normal.        Thought Content: Thought content normal.        Judgment: Judgment normal.      No results found for any visits on 11/15/23.  Recent Results (from the past 2160 hours)  UA/M w/rflx Culture, Routine     Status: Abnormal   Collection Time: 09/17/23  3:07 PM   Specimen: Urine  Result Value Ref Range   Specific Gravity, UA      >=1.030 (A) 1.005 - 1.030   pH, UA 5.5 5.0 - 7.5   Color, UA Red (A) Yellow   Appearance Ur Cloudy (A) Clear   Leukocytes,UA Negative Negative   Protein,UA 1+ (A) Negative/Trace   Glucose, UA 3+ (A) Negative   Ketones, UA 1+ (A) Negative   RBC, UA 3+ (A) Negative   Bilirubin, UA Negative Negative   Urobilinogen, Ur 0.2 0.2 - 1.0 mg/dL   Nitrite, UA Negative Negative   Microscopic Examination See below:     Comment: Microscopic was indicated and was performed.   Urinalysis Reflex Comment     Comment: This specimen has reflexed to a Urine Culture.  Microscopic Examination     Status: Abnormal   Collection Time: 09/17/23  3:07 PM  Result Value Ref Range   WBC, UA 11-30 (A) 0 - 5 /hpf   RBC, Urine >30 (A) 0 - 2 /hpf   Epithelial Cells (non renal) 0-10 0 - 10 /hpf   Casts None seen None seen /lpf   Bacteria, UA None seen None seen/Few  Urine Culture, Reflex     Status: None   Collection Time: 09/17/23  3:07 PM  Result Value Ref Range   Urine Culture, Routine Final report    Organism ID, Bacteria Comment     Comment: Mixed urogenital flora 25,000-50,000 colony forming units per mL   CBC with Differential/Platelet     Status: Abnormal   Collection Time: 11/04/23  1:30 PM  Result Value Ref Range   WBC 7.7 4.0 - 10.5 K/uL   RBC 5.22 (H) 3.87 - 5.11 MIL/uL   Hemoglobin 13.3 12.0 - 15.0 g/dL   HCT 57.8 46.9 - 62.9 %   MCV 81.6 80.0 - 100.0 fL   MCH 25.5 (L) 26.0 - 34.0 pg   MCHC 31.2 30.0 - 36.0 g/dL   RDW 52.8 (H) 41.3 - 24.4 %    Platelets 413 (H) 150 - 400 K/uL   nRBC 0.0 0.0 - 0.2 %   Neutrophils Relative % 58 %   Neutro Abs 4.5 1.7 - 7.7 K/uL   Lymphocytes Relative 33 %   Lymphs Abs 2.5 0.7 - 4.0 K/uL   Monocytes Relative 6 %   Monocytes Absolute 0.5 0.1 - 1.0 K/uL   Eosinophils Relative 2 %   Eosinophils Absolute 0.1 0.0 - 0.5 K/uL   Basophils Relative 1 %   Basophils Absolute 0.1 0.0 - 0.1 K/uL   Immature Granulocytes 0 %   Abs Immature Granulocytes 0.03 0.00 - 0.07 K/uL    Comment: Performed at [facility], [address], [place], [place] [address]  Comprehensive metabolic panel with GFR     Status: Abnormal   Collection Time: 11/04/23  1:30 PM  Result Value Ref Range   Sodium 135 135 - 145 mmol/L   Potassium 3.7 3.5 - 5.1 mmol/L   Chloride 106 98 - 111 mmol/L   CO2 17 (L) 22 - 32 mmol/L   Glucose, Bld 137 (H) 70 - 99 mg/dL    Comment: Glucose reference range applies only to samples taken after fasting for at least 8 hours.   BUN 14 6 - 20 mg/dL   Creatinine, Ser 2.53 (L) 0.44 -  1.00 mg/dL   Calcium  9.0 8.9 - 10.3 mg/dL   Total Protein 8.2 (H) 6.5 - 8.1 g/dL   Albumin 4.3 3.5 - 5.0 g/dL   AST 16 15 - 41 U/L   ALT 17 0 - 44 U/L   Alkaline Phosphatase 66 38 - 126 U/L   Total Bilirubin 0.8 0.0 - 1.2 mg/dL   GFR, Estimated >65 >78 mL/min    Comment: (NOTE) Calculated using the CKD-EPI Creatinine Equation (2021)    Anion gap 12 5 - 15    Comment: Performed at [facility], [address] [place], [place] [address]  Hemoglobin A1c     Status: Abnormal   Collection Time: 11/04/23  1:30 PM  Result Value Ref Range   Hgb A1c MFr Bld 7.4 (H) 4.8 - 5.6 %    Comment: (NOTE)         Prediabetes: 5.7 - 6.4         Diabetes: >6.4         Glycemic control for adults with diabetes: <7.0    Mean Plasma Glucose 166 mg/dL    Comment: (NOTE) Performed At: [facility] [address] [place], [place] [id number] Pearlean Botts MD MW:[id number]   Lipid panel      Status: Abnormal   Collection Time: 11/04/23  1:30 PM  Result Value Ref Range   Cholesterol 107 0 - 200 mg/dL   Triglycerides 42 <403 mg/dL   HDL 39 (L) >47 mg/dL   Total CHOL/HDL Ratio 2.7 RATIO   VLDL 8 0 - 40 mg/dL   LDL Cholesterol 60 0 - 99 mg/dL    Comment:        Total Cholesterol/HDL:CHD Risk Coronary Heart Disease Risk Table                     Men   Women  1/2 Average Risk   3.4   3.3  Average Risk       5.0   4.4  2 X Average Risk   9.6   7.1  3 X Average Risk  23.4   11.0        Use the calculated Patient Ratio above and the CHD Risk Table to determine the patient's CHD Risk.        ATP III CLASSIFICATION (LDL):  <100     mg/dL   Optimal  425-956  mg/dL   Near or Above                    Optimal  130-159  mg/dL   Borderline  387-564  mg/dL   High  >332     mg/dL   Very High Performed at [facility], [address] [place], [place] [address]   TSH     Status: None   Collection Time: 11/04/23  1:30 PM  Result Value Ref Range   TSH 1.358 0.350 - 4.500 uIU/mL    Comment: Performed by a 3rd Generation assay with a functional sensitivity of <=0.01 uIU/mL. Performed at [facility], [address], [place], [place] [address]       Assessment & Plan:  Continue same medications.  Problem List Items Addressed This Visit       Cardiovascular and Mediastinum   Essential hypertension - Primary     Endocrine   Diabetes mellitus (HCC)     Other   Hyperlipemia   Morbid obesity (HCC)   Vitamin  D deficiency    Return in about 3 months (around 02/15/2024) for fadting labs prior.   Total time spent: 25 minutes  [employer], NP  11/15/2023   This document may have been prepared by Dragon Voice Recognition software and as such may include unintentional dictation errors.

## 2023-11-19 ENCOUNTER — Encounter: Payer: Self-pay | Admitting: Nurse Practitioner

## 2023-11-19 ENCOUNTER — Ambulatory Visit: Admitting: Nurse Practitioner

## 2023-11-19 VITALS — BP 138/88 | HR 89 | Temp 98.2°F | Ht <= 58 in | Wt 180.0 lb

## 2023-11-19 DIAGNOSIS — Z6841 Body Mass Index (BMI) 40.0 and over, adult: Secondary | ICD-10-CM | POA: Diagnosis not present

## 2023-11-19 DIAGNOSIS — E1165 Type 2 diabetes mellitus with hyperglycemia: Secondary | ICD-10-CM

## 2023-11-19 DIAGNOSIS — E66813 Obesity, class 3: Secondary | ICD-10-CM

## 2023-11-19 DIAGNOSIS — Z7985 Long-term (current) use of injectable non-insulin antidiabetic drugs: Secondary | ICD-10-CM | POA: Diagnosis not present

## 2023-11-19 MED ORDER — TIRZEPATIDE 5 MG/0.5ML ~~LOC~~ SOAJ
5.0000 mg | SUBCUTANEOUS | 0 refills | Status: DC
Start: 2023-11-19 — End: 2023-12-17

## 2023-11-19 NOTE — Progress Notes (Signed)
 Office: [phone]  /  Fax: [phone]  WEIGHT SUMMARY AND BIOMETRICS  Weight Lost Since Last Visit: 4lb  Weight Gained Since Last Visit: 0lb   Vitals Temp: 98.2 F (36.8 C) BP: 138/88 Pulse Rate: 89 SpO2: 96 %   Anthropometric Measurements Height: 4' 7 (1.397 m) Weight: 180 lb (81.6 kg) BMI (Calculated): 41.84 Weight at Last Visit: 184lb Weight Lost Since Last Visit: 4lb Weight Gained Since Last Visit: 0lb Starting Weight: 186lb Total Weight Loss (lbs): 6 lb (2.722 kg)   No data recorded Other Clinical Data Fasting: No Labs: No Today's Visit #: 29 Starting Date: 05/31/21     HPI  Chief Complaint: OBESITY  Pinki is here to discuss her progress with her obesity treatment plan. She is on the practicing portion control and making smarter food choices, such as increasing vegetables and decreasing simple carbohydrates and states she is following her eating plan approximately 60 % of the time. She states she is exercising 10 minutes 3 days per week.   Interval History:  Since last office visit she has lost 4 pounds.  She has been making healthier choices and eating more protein. She has been eating more at home and hasn't been eating out as much.  She has been drinking more water.    Pharmacotherapy for weight loss: She is not currently taking medications for medical weight loss.     Previous pharmacotherapy for medical weight loss:  Phentermine   Bariatric surgery:  Patient has not had bariatric surgery.  Pharmacotherapy for DMT2:   She is currently taking Farxiga  10mg , Mounjaro  5 mg and glipizide  10mg  am and 5 mg in the evening.  Denies side effects.   Last A1c was 7.4 on 11/04/23 CBGs: 86-172 Episodes of hypoglycemia: None Taking Crestor  20mg  and enalapril  5 mg.   Last eye exam:  2023-scheduled on 11/23/23 She has tried Metformin and Ozempic   in the past.  -She stopped Metformin due to side effects of diarrhea.   -She stopped Ozempic  due to side  effects of vomiting and reflux.    Mounjaro  approved throught 09/16/24  Lab Results  Component Value Date   HGBA1C 7.4 (H) 11/04/2023   HGBA1C 7.4 (H) 07/25/2023   HGBA1C 8.3 (H) 04/04/2023   Lab Results  Component Value Date   MICROALBUR 30 07/09/2023   LDLCALC 60 11/04/2023   CREATININE 0.37 (L) 11/04/2023     PHYSICAL EXAM:  Blood pressure 138/88, pulse 89, temperature 98.2 F (36.8 C), height 4' 7 (1.397 m), weight 180 lb (81.6 kg), last menstrual period 11/05/2023, SpO2 96%. Body mass index is 41.84 kg/m.  General: She is overweight, cooperative, alert, well developed, and in no acute distress. PSYCH: Has normal mood, affect and thought process.   Extremities: No edema.  Neurologic: No gross sensory or motor deficits. No tremors or fasciculations noted.    DIAGNOSTIC DATA REVIEWED:  BMET    Component Value Date/Time   NA 135 11/04/2023 1330   K 3.7 11/04/2023 1330   CL 106 11/04/2023 1330   CO2 17 (L) 11/04/2023 1330   GLUCOSE 137 (H) 11/04/2023 1330   BUN 14 11/04/2023 1330   CREATININE 0.37 (L) 11/04/2023 1330   CALCIUM  9.0 11/04/2023 1330   GFRNONAA >60 11/04/2023 1330   GFRAA >60 11/02/2019 1401   Lab Results  Component Value Date   HGBA1C 7.4 (H) 11/04/2023   HGBA1C 6.2 (H) 07/28/2015   No results found for: INSULIN  Lab Results  Component Value Date  TSH 1.358 11/04/2023   CBC    Component Value Date/Time   WBC 7.7 11/04/2023 1330   RBC 5.22 (H) 11/04/2023 1330   HGB 13.3 11/04/2023 1330   HCT 42.6 11/04/2023 1330   PLT 413 (H) 11/04/2023 1330   MCV 81.6 11/04/2023 1330   MCH 25.5 (L) 11/04/2023 1330   MCHC 31.2 11/04/2023 1330   RDW 15.6 (H) 11/04/2023 1330   Iron Studies    Component Value Date/Time   IRON 54 07/25/2023 1240   TIBC 532 (H) 07/25/2023 1240   FERRITIN 8 (L) 07/25/2023 1240   IRONPCTSAT 10 (L) 07/25/2023 1240   Lipid Panel     Component Value Date/Time   CHOL 107 11/04/2023 1330   TRIG 42 11/04/2023 1330    HDL 39 (L) 11/04/2023 1330   CHOLHDL 2.7 11/04/2023 1330   VLDL 8 11/04/2023 1330   LDLCALC 60 11/04/2023 1330   Hepatic Function Panel     Component Value Date/Time   PROT 8.2 (H) 11/04/2023 1330   ALBUMIN 4.3 11/04/2023 1330   AST 16 11/04/2023 1330   ALT 17 11/04/2023 1330   ALKPHOS 66 11/04/2023 1330   BILITOT 0.8 11/04/2023 1330      Component Value Date/Time   TSH 1.358 11/04/2023 1330   Nutritional Lab Results  Component Value Date   VD25OH 43.85 04/04/2023   VD25OH See Scanned report in [facility] [place] [facility] 07/10/2022   VD25OH 53.97 12/05/2021     ASSESSMENT AND PLAN  TREATMENT PLAN FOR OBESITY:  Recommended Dietary Goals  Arnesha is currently in the action stage of change. As such, her goal is to continue weight management plan. She has agreed to practicing portion control and making smarter food choices, such as increasing vegetables and decreasing simple carbohydrates.  Behavioral Intervention  We discussed the following Behavioral Modification Strategies today: increasing lean protein intake to established goals, decreasing simple carbohydrates , increasing vegetables, increasing fiber rich foods, increasing water intake , and continue to work on maintaining a reduced calorie state, getting the recommended amount of protein, incorporating whole foods, making healthy choices, staying well hydrated and practicing mindfulness when eating..  Additional resources provided today: NA  Recommended Physical Activity Goals  Mersadies has been advised to work up to 150 minutes of moderate intensity aerobic activity a week and strengthening exercises 2-3 times per week for cardiovascular health, weight loss maintenance and preservation of muscle mass.   She has agreed to Think about enjoyable ways to increase daily physical activity and overcoming barriers to exercise, Increase physical activity in their day and reduce sedentary time (increase NEAT)., and Work on scheduling  and tracking physical activity.     ASSOCIATED CONDITIONS ADDRESSED TODAY  Action/Plan  Type 2 diabetes mellitus with hyperglycemia, without long-term current use of insulin  (HCC) -     Continue Tirzepatide ; Inject 5 mg into the skin once a week.  Dispense: 2 mL; Refill: 0.  Side effects discussed.   Class 3 severe obesity due to excess calories with serious comorbidity and body mass index (BMI) of 40.0 to 44.9 in adult      Last labs 11/04/23   Return in about 4 weeks (around 12/17/2023).SABRA She was informed of the importance of frequent follow up visits to maximize her success with intensive lifestyle modifications for her multiple health conditions.   ATTESTASTION STATEMENTS:  Reviewed by clinician on day of visit: allergies, medications, problem list, medical history, surgical history, family history, social history, and previous encounter notes.  Corean SAUNDERS. Dannae Kato FNP-C

## 2023-11-25 ENCOUNTER — Other Ambulatory Visit: Payer: Self-pay | Admitting: Cardiology

## 2023-12-17 ENCOUNTER — Telehealth (INDEPENDENT_AMBULATORY_CARE_PROVIDER_SITE_OTHER): Admitting: Nurse Practitioner

## 2023-12-17 DIAGNOSIS — E1165 Type 2 diabetes mellitus with hyperglycemia: Secondary | ICD-10-CM

## 2023-12-17 DIAGNOSIS — Z6841 Body Mass Index (BMI) 40.0 and over, adult: Secondary | ICD-10-CM

## 2023-12-17 DIAGNOSIS — E66813 Obesity, class 3: Secondary | ICD-10-CM | POA: Diagnosis not present

## 2023-12-17 DIAGNOSIS — Z7984 Long term (current) use of oral hypoglycemic drugs: Secondary | ICD-10-CM

## 2023-12-17 DIAGNOSIS — Z7985 Long-term (current) use of injectable non-insulin antidiabetic drugs: Secondary | ICD-10-CM

## 2023-12-17 MED ORDER — TIRZEPATIDE 7.5 MG/0.5ML ~~LOC~~ SOAJ
7.5000 mg | SUBCUTANEOUS | 0 refills | Status: DC
Start: 2023-12-17 — End: 2024-01-21

## 2023-12-17 NOTE — Progress Notes (Signed)
 Office: [phone]  /  Fax: [phone]  WEIGHT SUMMARY AND BIOMETRICS  TeleHealth Visit:  This visit was completed with telemedicine (audio/video) technology. Robin Vazquez has verbally consented to this TeleHealth visit. The patient is located at home, the provider is located at [facility]. The participants in this visit include the listed provider and patient. The visit was conducted today via MyChart video.   HPI  Chief Complaint: OBESITY  Robin Vazquez is being seen by video visit to discuss her progress with her obesity treatment plan. She is on the practicing portion control and making smarter food choices, such as increasing vegetables and decreasing simple carbohydrates and states she is following her eating plan approximately 60 % of the time. She states she is exercising 0 minutes 0 days per week.   Interval History:  Today's weight at home:  182 lbs  She notes she has been cooking more at home.  Has been trying to eat more protein and vegetables.  She is not tracking but is watching her portion sizes.  She hasn't been exercising due to hip pain.  Has seen ortho in the past. Was told that she will need a hip replacement in the future.   Notes polyphagia and cravings.   Pharmacotherapy for weight loss: She is not currently taking medications for medical weight loss.     Previous pharmacotherapy for medical weight loss:  Phentermine   Bariatric surgery:  Patient has not had bariatric surgery.  Pharmacotherapy for DMT2:   She is currently taking Farxiga  10mg , Mounjaro  5 mg and glipizide  10mg  am and 5 mg in the evening.  Denies side effects.   Last A1c was 7.4  CBGs: 102-145 Episodes of hypoglycemia: Denies  She is taking Crestor  20mg  and Enalapril  5 mg.   Last eye exam:  2023-scheduled on 11/23/23 She has tried Metformin and Ozempic   in the past.  -She stopped Metformin due to side effects of diarrhea.   -She stopped Ozempic  due to side effects of vomiting and reflux.     Mounjaro  approved throught 09/16/24   Lab Results  Component Value Date   HGBA1C 7.4 (H) 11/04/2023   HGBA1C 7.4 (H) 07/25/2023   HGBA1C 8.3 (H) 04/04/2023   Lab Results  Component Value Date   MICROALBUR 30 07/09/2023   LDLCALC 60 11/04/2023   CREATININE 0.37 (L) 11/04/2023        PHYSICAL EXAM:  Last menstrual period 11/05/2023. There is no height or weight on file to calculate BMI.  General: She is overweight, cooperative, alert, well developed, and in no acute distress. PSYCH: Has normal mood, affect and thought process.   Extremities: No edema.  Neurologic: No gross sensory or motor deficits. No tremors or fasciculations noted.    DIAGNOSTIC DATA REVIEWED:  BMET    Component Value Date/Time   NA 135 11/04/2023 1330   K 3.7 11/04/2023 1330   CL 106 11/04/2023 1330   CO2 17 (L) 11/04/2023 1330   GLUCOSE 137 (H) 11/04/2023 1330   BUN 14 11/04/2023 1330   CREATININE 0.37 (L) 11/04/2023 1330   CALCIUM  9.0 11/04/2023 1330   GFRNONAA >60 11/04/2023 1330   GFRAA >60 11/02/2019 1401   Lab Results  Component Value Date   HGBA1C 7.4 (H) 11/04/2023   HGBA1C 6.2 (H) 07/28/2015   No results found for: INSULIN  Lab Results  Component Value Date   TSH 1.358 11/04/2023   CBC    Component Value Date/Time   WBC 7.7 11/04/2023 1330   RBC  5.22 (H) 11/04/2023 1330   HGB 13.3 11/04/2023 1330   HCT 42.6 11/04/2023 1330   PLT 413 (H) 11/04/2023 1330   MCV 81.6 11/04/2023 1330   MCH 25.5 (L) 11/04/2023 1330   MCHC 31.2 11/04/2023 1330   RDW 15.6 (H) 11/04/2023 1330   Iron Studies    Component Value Date/Time   IRON 54 07/25/2023 1240   TIBC 532 (H) 07/25/2023 1240   FERRITIN 8 (L) 07/25/2023 1240   IRONPCTSAT 10 (L) 07/25/2023 1240   Lipid Panel     Component Value Date/Time   CHOL 107 11/04/2023 1330   TRIG 42 11/04/2023 1330   HDL 39 (L) 11/04/2023 1330   CHOLHDL 2.7 11/04/2023 1330   VLDL 8 11/04/2023 1330   LDLCALC 60 11/04/2023 1330   Hepatic  Function Panel     Component Value Date/Time   PROT 8.2 (H) 11/04/2023 1330   ALBUMIN 4.3 11/04/2023 1330   AST 16 11/04/2023 1330   ALT 17 11/04/2023 1330   ALKPHOS 66 11/04/2023 1330   BILITOT 0.8 11/04/2023 1330      Component Value Date/Time   TSH 1.358 11/04/2023 1330   Nutritional Lab Results  Component Value Date   VD25OH 43.85 04/04/2023   VD25OH See Scanned report in [facility] 07/10/2022   VD25OH 53.97 12/05/2021     ASSESSMENT AND PLAN  TREATMENT PLAN FOR OBESITY:  Recommended Dietary Goals  Robin Vazquez is currently in the action stage of change. As such, her goal is to continue weight management plan. She has agreed to practicing portion control and making smarter food choices, such as increasing vegetables and decreasing simple carbohydrates.  Behavioral Intervention  We discussed the following Behavioral Modification Strategies today: increasing lean protein intake to established goals, decreasing simple carbohydrates , increasing vegetables, increasing fiber rich foods, increasing water intake , work on meal planning and preparation, reading food labels , keeping healthy foods at home, and continue to work on maintaining a reduced calorie state, getting the recommended amount of protein, incorporating whole foods, making healthy choices, staying well hydrated and practicing mindfulness when eating..  Additional resources provided today: NA  Recommended Physical Activity Goals  Robin Vazquez has been advised to work up to 150 minutes of moderate intensity aerobic activity a week and strengthening exercises 2-3 times per week for cardiovascular health, weight loss maintenance and preservation of muscle mass.   She has agreed to Think about enjoyable ways to increase daily physical activity and overcoming barriers to exercise and Increase physical activity in their day and reduce sedentary time (increase NEAT).  ASSOCIATED CONDITIONS ADDRESSED  TODAY  Action/Plan  Type 2 diabetes mellitus with hyperglycemia, without long-term current use of insulin  (HCC) -     Increase Tirzepatide ; Inject 7.5 mg into the skin once a week.  Dispense: 2 mL; Refill: 0. Side effects discussed.   Class 3 severe obesity due to excess calories with serious comorbidity and body mass index (BMI) of 40.0 to 44.9 in adult         Return in about 4 weeks (around 01/14/2024).Robin Vazquez She was informed of the importance of frequent follow up visits to maximize her success with intensive lifestyle modifications for her multiple health conditions.   ATTESTASTION STATEMENTS:  Reviewed by clinician on day of visit: allergies, medications, problem list, medical history, surgical history, family history, social history, and previous encounter notes.    Corean SAUNDERS. Crimson Dubberly FNP-C

## 2023-12-22 ENCOUNTER — Other Ambulatory Visit: Payer: Self-pay | Admitting: Cardiology

## 2023-12-22 ENCOUNTER — Other Ambulatory Visit: Payer: Self-pay | Admitting: Internal Medicine

## 2023-12-22 DIAGNOSIS — E119 Type 2 diabetes mellitus without complications: Secondary | ICD-10-CM

## 2023-12-24 ENCOUNTER — Ambulatory Visit
Admission: RE | Admit: 2023-12-24 | Discharge: 2023-12-24 | Disposition: A | Discharge status: Home / Self Care | Source: Ambulatory Visit | Attending: Obstetrics and Gynecology | Admitting: Obstetrics and Gynecology

## 2023-12-24 DIAGNOSIS — Z1231 Encounter for screening mammogram for malignant neoplasm of breast: Secondary | ICD-10-CM

## 2023-12-27 ENCOUNTER — Other Ambulatory Visit: Payer: Self-pay | Admitting: Cardiology

## 2023-12-27 ENCOUNTER — Other Ambulatory Visit: Payer: Self-pay | Admitting: Internal Medicine

## 2023-12-27 DIAGNOSIS — R928 Other abnormal and inconclusive findings on diagnostic imaging of breast: Secondary | ICD-10-CM

## 2024-01-03 ENCOUNTER — Ambulatory Visit

## 2024-01-03 ENCOUNTER — Ambulatory Visit
Admission: RE | Admit: 2024-01-03 | Discharge: 2024-01-03 | Disposition: A | Discharge status: Home / Self Care | Source: Ambulatory Visit | Attending: Cardiology | Admitting: Cardiology

## 2024-01-03 DIAGNOSIS — R928 Other abnormal and inconclusive findings on diagnostic imaging of breast: Secondary | ICD-10-CM

## 2024-01-06 ENCOUNTER — Ambulatory Visit: Payer: Self-pay | Admitting: Cardiology

## 2024-01-13 ENCOUNTER — Other Ambulatory Visit: Payer: Self-pay | Admitting: Cardiology

## 2024-01-13 DIAGNOSIS — E1165 Type 2 diabetes mellitus with hyperglycemia: Secondary | ICD-10-CM

## 2024-01-21 ENCOUNTER — Encounter: Payer: Self-pay | Admitting: Nurse Practitioner

## 2024-01-21 ENCOUNTER — Ambulatory Visit: Admitting: Nurse Practitioner

## 2024-01-21 VITALS — BP 121/88 | HR 76 | Temp 98.8°F | Ht <= 58 in | Wt 179.0 lb

## 2024-01-21 DIAGNOSIS — Z7985 Long-term (current) use of injectable non-insulin antidiabetic drugs: Secondary | ICD-10-CM

## 2024-01-21 DIAGNOSIS — E66813 Obesity, class 3: Secondary | ICD-10-CM | POA: Diagnosis not present

## 2024-01-21 DIAGNOSIS — Z6841 Body Mass Index (BMI) 40.0 and over, adult: Secondary | ICD-10-CM | POA: Diagnosis not present

## 2024-01-21 DIAGNOSIS — E1165 Type 2 diabetes mellitus with hyperglycemia: Secondary | ICD-10-CM | POA: Diagnosis not present

## 2024-01-21 MED ORDER — TIRZEPATIDE 7.5 MG/0.5ML ~~LOC~~ SOAJ: 7.5000 mg | SUBCUTANEOUS | 0 refills | Status: DC

## 2024-01-21 NOTE — Progress Notes (Signed)
 Office: [phone]  /  Fax: [phone]  WEIGHT SUMMARY AND BIOMETRICS  Weight Lost Since Last Visit: 1lb  Weight Gained Since Last Visit: 0lb   Vitals Temp: 98.8 F (37.1 C) BP: 121/88 Pulse Rate: 76 SpO2: 100 %   Anthropometric Measurements Height: 4' 7 (1.397 m) Weight: 179 lb (81.2 kg) BMI (Calculated): 41.6 Weight at Last Visit: 180lb Weight Lost Since Last Visit: 1lb Weight Gained Since Last Visit: 0lb Starting Weight: 186lb Total Weight Loss (lbs): 7 lb (3.175 kg)   No data recorded Other Clinical Data Fasting: No Labs: No Today's Visit #: 31 Starting Date: 05/31/21     HPI  Chief Complaint: OBESITY  Robin Vazquez is here to discuss her progress with her obesity treatment plan. She is on the practicing portion control and making smarter food choices, such as increasing vegetables and decreasing simple carbohydrates and states she is following her eating plan approximately 50 % of the time. She states she is exercising 10 minutes 2 days per week.   Interval History:  Since last office visit she has lost 1 pound.  She is under some stress at home and is trying to figure things out.  She is drinking water and juice.  Pharmacotherapy for weight loss: She is not currently taking medications for medical weight loss.     Previous pharmacotherapy for medical weight loss:  Phentermine   Bariatric surgery:  Patient has not had bariatric surgery.  Pharmacotherapy for DMT2:   She is currently taking Farxiga  10mg , Mounjaro  7.5 mg (increased after last visit) and glipizide  10mg  am and 5 mg in the evening.  Denies side effects.   Last A1c was 7.4  CBGs: 85-141 Episodes of hypoglycemia: Denies  She is taking Crestor  20mg  and Enalapril  5 mg.   Last eye exam:  11/23/23  She has tried Metformin and Ozempic   in the past.  -She stopped Metformin due to side effects of diarrhea.   -She stopped Ozempic  due to side effects of vomiting and reflux.    Mounjaro  approved  throught 09/16/24   Lab Results  Component Value Date   HGBA1C 7.4 (H) 11/04/2023   HGBA1C 7.4 (H) 07/25/2023   HGBA1C 8.3 (H) 04/04/2023   Lab Results  Component Value Date   MICROALBUR 30 07/09/2023   LDLCALC 60 11/04/2023   CREATININE 0.37 (L) 11/04/2023      PHYSICAL EXAM:  Blood pressure 121/88, pulse 76, temperature 98.8 F (37.1 C), height 4' 7 (1.397 m), weight 179 lb (81.2 kg), last menstrual period 12/23/2023, SpO2 100%. Body mass index is 41.6 kg/m.  General: She is overweight, cooperative, alert, well developed, and in no acute distress. PSYCH: Has normal mood, affect and thought process.   Extremities: No edema.  Neurologic: No gross sensory or motor deficits. No tremors or fasciculations noted.    DIAGNOSTIC DATA REVIEWED:  BMET    Component Value Date/Time   NA 135 11/04/2023 1330   K 3.7 11/04/2023 1330   CL 106 11/04/2023 1330   CO2 17 (L) 11/04/2023 1330   GLUCOSE 137 (H) 11/04/2023 1330   BUN 14 11/04/2023 1330   CREATININE 0.37 (L) 11/04/2023 1330   CALCIUM  9.0 11/04/2023 1330   GFRNONAA >60 11/04/2023 1330   GFRAA >60 11/02/2019 1401   Lab Results  Component Value Date   HGBA1C 7.4 (H) 11/04/2023   HGBA1C 6.2 (H) 07/28/2015   No results found for: INSULIN  Lab Results  Component Value Date   TSH 1.358 11/04/2023  CBC    Component Value Date/Time   WBC 7.7 11/04/2023 1330   RBC 5.22 (H) 11/04/2023 1330   HGB 13.3 11/04/2023 1330   HCT 42.6 11/04/2023 1330   PLT 413 (H) 11/04/2023 1330   MCV 81.6 11/04/2023 1330   MCH 25.5 (L) 11/04/2023 1330   MCHC 31.2 11/04/2023 1330   RDW 15.6 (H) 11/04/2023 1330   Iron Studies    Component Value Date/Time   IRON 54 07/25/2023 1240   TIBC 532 (H) 07/25/2023 1240   FERRITIN 8 (L) 07/25/2023 1240   IRONPCTSAT 10 (L) 07/25/2023 1240   Lipid Panel     Component Value Date/Time   CHOL 107 11/04/2023 1330   TRIG 42 11/04/2023 1330   HDL 39 (L) 11/04/2023 1330   CHOLHDL 2.7 11/04/2023  1330   VLDL 8 11/04/2023 1330   LDLCALC 60 11/04/2023 1330   Hepatic Function Panel     Component Value Date/Time   PROT 8.2 (H) 11/04/2023 1330   ALBUMIN 4.3 11/04/2023 1330   AST 16 11/04/2023 1330   ALT 17 11/04/2023 1330   ALKPHOS 66 11/04/2023 1330   BILITOT 0.8 11/04/2023 1330      Component Value Date/Time   TSH 1.358 11/04/2023 1330   Nutritional Lab Results  Component Value Date   VD25OH 43.85 04/04/2023   VD25OH See Scanned report in [facility] 07/10/2022   VD25OH 53.97 12/05/2021     ASSESSMENT AND PLAN  TREATMENT PLAN FOR OBESITY:  Recommended Dietary Goals  Lekesha is currently in the action stage of change. As such, her goal is to continue weight management plan. She has agreed to practicing portion control and making smarter food choices, such as increasing vegetables and decreasing simple carbohydrates.  Behavioral Intervention  We discussed the following Behavioral Modification Strategies today: increasing lean protein intake to established goals, decreasing simple carbohydrates , increasing vegetables, increasing fiber rich foods, increasing water intake , work on meal planning and preparation, reading food labels , keeping healthy foods at home, continue to work on maintaining a reduced calorie state, getting the recommended amount of protein, incorporating whole foods, making healthy choices, staying well hydrated and practicing mindfulness when eating., and increase protein intake, fibrous foods (25 grams per day for women, 30 grams for men) and water to improve satiety and decrease hunger signals. .  Additional resources provided today: NA  Recommended Physical Activity Goals  Weston has been advised to work up to 150 minutes of moderate intensity aerobic activity a week and strengthening exercises 2-3 times per week for cardiovascular health, weight loss maintenance and preservation of muscle mass.   She has agreed to Think about enjoyable  ways to increase daily physical activity and overcoming barriers to exercise, Increase physical activity in their day and reduce sedentary time (increase NEAT)., and Work on scheduling and tracking physical activity.    ASSOCIATED CONDITIONS ADDRESSED TODAY  Action/Plan  Type 2 diabetes mellitus with hyperglycemia, without long-term current use of insulin  (HCC) -     continue ; Inject 7.5 mg into the skin once a week.  Dispense: 2 mL; Refill: 0.  Side effects discussed  Monitor for hypoglycemia   Class 3 severe obesity due to excess calories with serious comorbidity and body mass index (BMI) of 40.0 to 44.9 in adult     Has an appt with PCP on 02/13/24 for follow up and labs.     Return in about 4 weeks (around 02/18/2024).SABRA She was informed of the  importance of frequent follow up visits to maximize her success with intensive lifestyle modifications for her multiple health conditions.   ATTESTASTION STATEMENTS:  Reviewed by clinician on day of visit: allergies, medications, problem list, medical history, surgical history, family history, social history, and previous encounter notes.     Corean SAUNDERS. Duan Scharnhorst FNP-C

## 2024-02-07 ENCOUNTER — Other Ambulatory Visit: Payer: Self-pay | Admitting: Internal Medicine

## 2024-02-07 ENCOUNTER — Other Ambulatory Visit (HOSPITAL_COMMUNITY)
Admission: RE | Admit: 2024-02-07 | Discharge: 2024-02-07 | Disposition: A | Discharge status: Home / Self Care | Source: Ambulatory Visit | Attending: Nurse Practitioner | Admitting: Nurse Practitioner

## 2024-02-07 DIAGNOSIS — E119 Type 2 diabetes mellitus without complications: Secondary | ICD-10-CM | POA: Insufficient documentation

## 2024-02-07 DIAGNOSIS — E782 Mixed hyperlipidemia: Secondary | ICD-10-CM | POA: Insufficient documentation

## 2024-02-07 DIAGNOSIS — I10 Essential (primary) hypertension: Secondary | ICD-10-CM | POA: Insufficient documentation

## 2024-02-08 ENCOUNTER — Other Ambulatory Visit: Payer: Self-pay | Admitting: Cardiology

## 2024-02-08 DIAGNOSIS — E1165 Type 2 diabetes mellitus with hyperglycemia: Secondary | ICD-10-CM

## 2024-02-13 ENCOUNTER — Ambulatory Visit: Admitting: Cardiology

## 2024-02-14 ENCOUNTER — Other Ambulatory Visit: Payer: Self-pay | Admitting: Nurse Practitioner

## 2024-02-14 DIAGNOSIS — E1165 Type 2 diabetes mellitus with hyperglycemia: Secondary | ICD-10-CM

## 2024-02-18 ENCOUNTER — Other Ambulatory Visit (HOSPITAL_COMMUNITY)
Admission: RE | Admit: 2024-02-18 | Discharge: 2024-02-18 | Disposition: A | Discharge status: Home / Self Care | Source: Ambulatory Visit | Attending: Internal Medicine | Admitting: Internal Medicine

## 2024-02-18 ENCOUNTER — Ambulatory Visit: Payer: Self-pay | Admitting: Internal Medicine

## 2024-02-18 DIAGNOSIS — E119 Type 2 diabetes mellitus without complications: Secondary | ICD-10-CM | POA: Diagnosis present

## 2024-02-18 DIAGNOSIS — E782 Mixed hyperlipidemia: Secondary | ICD-10-CM | POA: Insufficient documentation

## 2024-02-18 DIAGNOSIS — L100 Pemphigus vulgaris: Secondary | ICD-10-CM | POA: Insufficient documentation

## 2024-02-18 LAB — CBC WITH DIFFERENTIAL/PLATELET
Abs Immature Granulocytes: 0.02 K/uL (ref 0.00–0.07)
Basophils Absolute: 0 K/uL (ref 0.0–0.1)
Basophils Relative: 0 %
Eosinophils Absolute: 0.1 K/uL (ref 0.0–0.5)
Eosinophils Relative: 1 %
HCT: 40 % (ref 36.0–46.0)
Hemoglobin: 12 g/dL (ref 12.0–15.0)
Immature Granulocytes: 0 %
Lymphocytes Relative: 32 %
Lymphs Abs: 2.2 K/uL (ref 0.7–4.0)
MCH: 25.1 pg — ABNORMAL LOW (ref 26.0–34.0)
MCHC: 30 g/dL (ref 30.0–36.0)
MCV: 83.7 fL (ref 80.0–100.0)
Monocytes Absolute: 0.4 K/uL (ref 0.1–1.0)
Monocytes Relative: 5 %
Neutro Abs: 4.2 K/uL (ref 1.7–7.7)
Neutrophils Relative %: 62 %
Platelets: 347 K/uL (ref 150–400)
RBC: 4.78 MIL/uL (ref 3.87–5.11)
RDW: 15.1 % (ref 11.5–15.5)
WBC: 6.8 K/uL (ref 4.0–10.5)
nRBC: 0 % (ref 0.0–0.2)

## 2024-02-18 LAB — HEMOGLOBIN A1C
Hgb A1c MFr Bld: 5.5 % (ref 4.8–5.6)
Mean Plasma Glucose: 111.15 mg/dL

## 2024-02-18 LAB — COMPREHENSIVE METABOLIC PANEL WITH GFR
ALT: 14 U/L (ref 0–44)
AST: 20 U/L (ref 15–41)
Albumin: 4 g/dL (ref 3.5–5.0)
Alkaline Phosphatase: 66 U/L (ref 38–126)
Anion gap: 15 (ref 5–15)
BUN: 10 mg/dL (ref 6–20)
CO2: 18 mmol/L — ABNORMAL LOW (ref 22–32)
Calcium: 8.9 mg/dL (ref 8.9–10.3)
Chloride: 102 mmol/L (ref 98–111)
Creatinine, Ser: 0.42 mg/dL — ABNORMAL LOW (ref 0.44–1.00)
GFR, Estimated: >60 mL/min (ref >60)
Glucose, Bld: 133 mg/dL — ABNORMAL HIGH (ref 70–99)
Potassium: 4.1 mmol/L (ref 3.5–5.1)
Sodium: 134 mmol/L — ABNORMAL LOW (ref 135–145)
Total Bilirubin: 0.4 mg/dL (ref 0.0–1.2)
Total Protein: 7.1 g/dL (ref 6.5–8.1)

## 2024-02-18 LAB — LIPID PANEL
Cholesterol: 144 mg/dL (ref 0–200)
HDL: 39 mg/dL — ABNORMAL LOW (ref >40)
LDL Cholesterol: 96 mg/dL (ref 0–99)
Total CHOL/HDL Ratio: 3.7 ratio
Triglycerides: 43 mg/dL (ref <150)
VLDL: 9 mg/dL (ref 0–40)

## 2024-02-18 LAB — TSH: TSH: 1.16 u[IU]/mL (ref 0.350–4.500)

## 2024-02-26 ENCOUNTER — Encounter: Payer: Self-pay | Admitting: Nurse Practitioner

## 2024-02-26 ENCOUNTER — Ambulatory Visit: Admitting: Nurse Practitioner

## 2024-02-26 VITALS — BP 118/68 | HR 83 | Temp 98.6°F | Ht <= 58 in | Wt 179.0 lb

## 2024-02-26 DIAGNOSIS — E1165 Type 2 diabetes mellitus with hyperglycemia: Secondary | ICD-10-CM | POA: Diagnosis not present

## 2024-02-26 DIAGNOSIS — E782 Mixed hyperlipidemia: Secondary | ICD-10-CM | POA: Diagnosis not present

## 2024-02-26 DIAGNOSIS — Z7985 Long-term (current) use of injectable non-insulin antidiabetic drugs: Secondary | ICD-10-CM

## 2024-02-26 DIAGNOSIS — E66813 Obesity, class 3: Secondary | ICD-10-CM | POA: Diagnosis not present

## 2024-02-26 DIAGNOSIS — E1169 Type 2 diabetes mellitus with other specified complication: Secondary | ICD-10-CM | POA: Diagnosis not present

## 2024-02-26 DIAGNOSIS — Z6841 Body Mass Index (BMI) 40.0 and over, adult: Secondary | ICD-10-CM

## 2024-02-26 MED ORDER — TIRZEPATIDE 7.5 MG/0.5ML ~~LOC~~ SOAJ: 7.5000 mg | SUBCUTANEOUS | 0 refills | Status: DC

## 2024-02-26 NOTE — Progress Notes (Signed)
 Office: [phone]  /  Fax: [phone]  WEIGHT SUMMARY AND BIOMETRICS  Weight Lost Since Last Visit: 0lb  Weight Gained Since Last Visit: 0lb   Vitals Temp: 98.6 F (37 C) BP: 118/68 Pulse Rate: 83 SpO2: 100 %   Anthropometric Measurements Height: 4' 7 (1.397 m) Weight: 179 lb (81.2 kg) BMI (Calculated): 41.6 Weight at Last Visit: 179lb Weight Lost Since Last Visit: 0lb Weight Gained Since Last Visit: 0lb Starting Weight: 186lb Total Weight Loss (lbs): 7 lb (3.175 kg)   No data recorded Other Clinical Data Fasting: No Labs: No Today's Visit #: 32 Starting Date: 05/31/21     HPI  Chief Complaint: OBESITY  Robin Vazquez is here to discuss her progress with her obesity treatment plan. She is on the practicing portion control and making smarter food choices, such as increasing vegetables and decreasing simple carbohydrates and states she is following her eating plan approximately 60 % of the time. She states she is exercising 10 minutes 3 days per week.   Interval History:  Since last office visit she has maintained her weight.  She feels like she is eating healthier and is trying to watching her portion sizes.  She eats out once every couple of weeks.  She is focusing on eating protein and vegetables with each meal.  She is drinking sodas, lemonade and water daily.  She is walking 3 days per week.    Pharmacotherapy for weight loss: She is not currently taking medications for medical weight loss.     Previous pharmacotherapy for medical weight loss:  Phentermine   Bariatric surgery:  Patient has not had bariatric surgery.   Pharmacotherapy for DMT2:   She is currently taking Farxiga  10mg , Mounjaro  7.5 mg and glipizide  10mg  am and 5 mg in the evening.  Denies side effects.   Last A1c was 5.5 on 02/18/24-Improved CBGs: 85-182 Episodes of hypoglycemia: Denies  She is taking Crestor  20mg  and Enalapril  5 mg.   Last eye exam:  11/23/23  She has tried Metformin  and Ozempic   in the past.  -She stopped Metformin due to side effects of diarrhea.   -She stopped Ozempic  due to side effects of vomiting and reflux.    Mounjaro  approved throught 09/16/24   Lab Results  Component Value Date   HGBA1C 5.5 02/18/2024   HGBA1C 7.4 (H) 11/04/2023   HGBA1C 7.4 (H) 07/25/2023   Lab Results  Component Value Date   MICROALBUR 30 07/09/2023   LDLCALC 96 02/18/2024   CREATININE 0.42 (L) 02/18/2024    Hyperlipidemia Medication(s): Crestor  20mg  three days per week-not taking consistently. Denies side effects.   Cardiovascular risk factors: diabetes mellitus, dyslipidemia, obesity (BMI >= 30 kg/m2), and sedentary lifestyle FH:  patient was adopted  Lab Results  Component Value Date   CHOL 144 02/18/2024   HDL 39 (L) 02/18/2024   LDLCALC 96 02/18/2024   TRIG 43 02/18/2024   CHOLHDL 3.7 02/18/2024   Lab Results  Component Value Date   ALT 14 02/18/2024   AST 20 02/18/2024   ALKPHOS 66 02/18/2024   BILITOT 0.4 02/18/2024   The 10-year ASCVD risk score (Arnett DK, et al., 2019) is: 3.7%   Values used to calculate the score:     Age: 38 years     Clincally relevant sex: Female     Is Non-Hispanic African American: Yes     Diabetic: Yes     Tobacco smoker: No     Systolic Blood Pressure: 118 mmHg  Is BP treated: Yes     HDL Cholesterol: 39 mg/dL     Total Cholesterol: 144 mg/dL   PHYSICAL EXAM:  Blood pressure 118/68, pulse 83, temperature 98.6 F (37 C), height 4' 7 (1.397 m), weight 179 lb (81.2 kg), last menstrual period 02/23/2024, SpO2 100%. Body mass index is 41.6 kg/m.  General: She is overweight, cooperative, alert, well developed, and in no acute distress. PSYCH: Has normal mood, affect and thought process.   Extremities: No edema.  Neurologic: No gross sensory or motor deficits. No tremors or fasciculations noted.    DIAGNOSTIC DATA REVIEWED:  BMET    Component Value Date/Time   NA 134 (L) 02/18/2024 1226   K 4.1  02/18/2024 1226   CL 102 02/18/2024 1226   CO2 18 (L) 02/18/2024 1226   GLUCOSE 133 (H) 02/18/2024 1226   BUN 10 02/18/2024 1226   CREATININE 0.42 (L) 02/18/2024 1226   CALCIUM  8.9 02/18/2024 1226   GFRNONAA >60 02/18/2024 1226   GFRAA >60 11/02/2019 1401   Lab Results  Component Value Date   HGBA1C 5.5 02/18/2024   HGBA1C 6.2 (H) 07/28/2015   No results found for: INSULIN  Lab Results  Component Value Date   TSH 1.160 02/18/2024   CBC    Component Value Date/Time   WBC 6.8 02/18/2024 1226   RBC 4.78 02/18/2024 1226   HGB 12.0 02/18/2024 1226   HCT 40.0 02/18/2024 1226   PLT 347 02/18/2024 1226   MCV 83.7 02/18/2024 1226   MCH 25.1 (L) 02/18/2024 1226   MCHC 30.0 02/18/2024 1226   RDW 15.1 02/18/2024 1226   Iron Studies    Component Value Date/Time   IRON 54 07/25/2023 1240   TIBC 532 (H) 07/25/2023 1240   FERRITIN 8 (L) 07/25/2023 1240   IRONPCTSAT 10 (L) 07/25/2023 1240   Lipid Panel     Component Value Date/Time   CHOL 144 02/18/2024 1226   TRIG 43 02/18/2024 1226   HDL 39 (L) 02/18/2024 1226   CHOLHDL 3.7 02/18/2024 1226   VLDL 9 02/18/2024 1226   LDLCALC 96 02/18/2024 1226   Hepatic Function Panel     Component Value Date/Time   PROT 7.1 02/18/2024 1226   ALBUMIN 4.0 02/18/2024 1226   AST 20 02/18/2024 1226   ALT 14 02/18/2024 1226   ALKPHOS 66 02/18/2024 1226   BILITOT 0.4 02/18/2024 1226      Component Value Date/Time   TSH 1.160 02/18/2024 1226   Nutritional Lab Results  Component Value Date   VD25OH 43.85 04/04/2023   VD25OH See Scanned report in [facility] 07/10/2022   VD25OH 53.97 12/05/2021     ASSESSMENT AND PLAN  TREATMENT PLAN FOR OBESITY:  Recommended Dietary Goals  Robin Vazquez is currently in the action stage of change. As such, her goal is to continue weight management plan. She has agreed to practicing portion control and making smarter food choices, such as increasing vegetables and decreasing simple  carbohydrates.  Needs to increase protein intake and decrease carbs.  Stop sugary drinks.    Behavioral Intervention  We discussed the following Behavioral Modification Strategies today: increasing lean protein intake to established goals, decreasing simple carbohydrates , increasing vegetables, increasing fiber rich foods, increasing water intake , work on meal planning and preparation, reading food labels , keeping healthy foods at home, planning for success, continue to work on maintaining a reduced calorie state, getting the recommended amount of protein, incorporating whole foods, making healthy choices, staying well  hydrated and practicing mindfulness when eating., and increase protein intake, fibrous foods (25 grams per day for women, 30 grams for men) and water to improve satiety and decrease hunger signals. .  Additional resources provided today: NA  Recommended Physical Activity Goals  Robin Vazquez has been advised to work up to 150 minutes of moderate intensity aerobic activity a week and strengthening exercises 2-3 times per week for cardiovascular health, weight loss maintenance and preservation of muscle mass.   She has agreed to Think about enjoyable ways to increase daily physical activity and overcoming barriers to exercise, Increase physical activity in their day and reduce sedentary time (increase NEAT)., Work on scheduling and tracking physical activity. , and Combine aerobic and strengthening exercises for efficiency and improved cardiometabolic health.   ASSOCIATED CONDITIONS ADDRESSED TODAY  Action/Plan  Type 2 diabetes mellitus with hyperglycemia, without long-term current use of insulin  (HCC) -     Tirzepatide ; Inject 7.5 mg into the skin once a week.  Dispense: 2 mL; Refill: 0  Mixed diabetic hyperlipidemia associated with type 2 diabetes mellitus (HCC) Discussed the importance of taking Crestor  daily, as directed  Class 3 severe obesity due to excess calories with  serious comorbidity and body mass index (BMI) of 40.0 to 44.9 in adult [facility])      Last labs reviewed with patient from 02/18/24   Return in about 6 weeks (around 04/08/2024).Robin Vazquez She was informed of the importance of frequent follow up visits to maximize her success with intensive lifestyle modifications for her multiple health conditions.   ATTESTASTION STATEMENTS:  Reviewed by clinician on day of visit: allergies, medications, problem list, medical history, surgical history, family history, social history, and previous encounter notes.     Robin Vazquez SAUNDERS. Damone Fancher FNP-C

## 2024-02-28 ENCOUNTER — Ambulatory Visit: Admitting: Cardiology

## 2024-03-12 ENCOUNTER — Encounter: Payer: Self-pay | Admitting: Cardiology

## 2024-03-12 ENCOUNTER — Ambulatory Visit (INDEPENDENT_AMBULATORY_CARE_PROVIDER_SITE_OTHER): Admitting: Cardiology

## 2024-03-12 VITALS — BP 116/74 | HR 82 | Ht <= 58 in | Wt 182.4 lb

## 2024-03-12 DIAGNOSIS — E782 Mixed hyperlipidemia: Secondary | ICD-10-CM

## 2024-03-12 DIAGNOSIS — I10 Essential (primary) hypertension: Secondary | ICD-10-CM

## 2024-03-12 DIAGNOSIS — E1165 Type 2 diabetes mellitus with hyperglycemia: Secondary | ICD-10-CM

## 2024-03-12 DIAGNOSIS — E119 Type 2 diabetes mellitus without complications: Secondary | ICD-10-CM

## 2024-03-12 MED ORDER — ENALAPRIL MALEATE 5 MG PO TABS: 5.0000 mg | ORAL_TABLET | Freq: Every day | ORAL | 1 refills | Status: AC

## 2024-03-12 MED ORDER — AMLODIPINE BESYLATE 5 MG PO TABS: 5.0000 mg | ORAL_TABLET | Freq: Every day | ORAL | 1 refills | Status: AC

## 2024-03-12 MED ORDER — FARXIGA 10 MG PO TABS: 10.0000 mg | ORAL_TABLET | Freq: Every morning | ORAL | 1 refills | Status: AC

## 2024-03-12 NOTE — Progress Notes (Signed)
 Established Patient Office Visit  Subjective:  Patient ID: Robin Vazquez, female    DOB: 05-21-1986  Age: 38 y.o. MRN: [id number]  Chief Complaint  Patient presents with   Follow-up    3 month lab results    Patient in office for 3 month follow up, discuss recent lab results. Patient doing well, no complaints today. Discussed recent lab work. Hgb A1c much better controlled since starting Mounjaro . LDL at goal. Mammogram done 11/2023.  Continue current medications.     No other concerns at this time.   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    Diabetes mellitus without complication (HCC)    Herpes    genitial   History of uterine fibroid 09/2015   Hyperlipemia    Hypertension    Obesity    Other fatigue     Past Surgical History:  Procedure Laterality Date   joint     on legs and feet   MYOMECTOMY ABDOMINAL APPROACH  10/18/2017   performed at [facility]   UTERINE FIBROID SURGERY      Social History   Socioeconomic History   Marital status: Single    Spouse name: Not on file   Number of children: Not on file   Years of education: Not on file   Highest education level: Not on file  Occupational History   Occupation: Disabled    Employer: Not employed  Tobacco Use   Smoking status: Never   Smokeless tobacco: Never  Vaping Use   Vaping status: Never Used  Substance and Sexual Activity   Alcohol use: No   Drug use: No   Sexual activity: Not Currently    Birth control/protection: None  Other Topics Concern   Not on file  Social History Narrative   Not on file   Social Drivers of Health   Financial Resource Strain: Not on file  Food Insecurity: Not on file  Transportation Needs: Not on file  Physical Activity: Not on file  Stress: Not on file  Social Connections: Not on file  Intimate Partner  Violence: Not on file    Family History  Adopted: Yes  Family history unknown: Yes    No Known Allergies  Outpatient Medications Prior to Visit  Medication Sig   Accu-Chek Softclix Lancets lancets 1 each by Other route daily. Use to check blood sugar once daily.   Blood Glucose Monitoring Suppl (ACCU-CHEK AVIVA PLUS) w/Device KIT 1 each by Other route daily. Use to check blood sugar once daily.   glipiZIDE  (GLUCOTROL ) 10 MG tablet TAKE 1 TABLET BY MOUTH IN THE MORNING AND 1/2 (ONE-HALF) AT NIGHT   glucose blood (ACCU-CHEK AVIVA PLUS) test strip 1 each by Other route daily. Use to check blood pressure once daily.   glucose blood (ACCU-CHEK GUIDE TEST) test strip 1 each by Other route in the morning, at noon, and at bedtime. Use as instructed   rosuvastatin  (CRESTOR ) 20 MG tablet Take 1 tablet (20 mg total) by mouth every evening.   tirzepatide  (MOUNJARO ) 7.5 MG/0.5ML Pen Inject 7.5 mg into the skin once a week.   [DISCONTINUED] amLODipine  (NORVASC ) 5 MG tablet Take 1 tablet by mouth once daily   [DISCONTINUED] enalapril  (VASOTEC ) 5 MG tablet Take 1 tablet by mouth once daily   [DISCONTINUED] FARXIGA  10 MG TABS tablet TAKE 1 TABLET BY MOUTH IN  THE MORNING   nystatin  cream (MYCOSTATIN ) Apply 1 Application topically 2 (two) times daily. (Patient not taking: Reported on 03/12/2024)   No facility-administered medications prior to visit.    Review of Systems  Constitutional: Negative.   HENT: Negative.    Eyes: Negative.   Respiratory: Negative.  Negative for shortness of breath.   Cardiovascular: Negative.  Negative for chest pain.  Gastrointestinal: Negative.  Negative for abdominal pain, constipation and diarrhea.  Genitourinary: Negative.   Musculoskeletal:  Negative for joint pain and myalgias.  Skin: Negative.   Neurological: Negative.  Negative for dizziness and headaches.  Endo/Heme/Allergies: Negative.   All other systems reviewed and are negative.      Objective:   BP  116/74   Pulse 82   Ht 4' 7 (1.397 m)   Wt 182 lb 6.4 oz (82.7 kg)   LMP 02/23/2024 (Approximate)   SpO2 99%   BMI 42.39 kg/m   Vitals:   03/12/24 1438  BP: 116/74  Pulse: 82  Height: 4' 7 (1.397 m)  Weight: 182 lb 6.4 oz (82.7 kg)  SpO2: 99%  BMI (Calculated): 42.39    Physical Exam Vitals and nursing note reviewed.  Constitutional:      Appearance: Normal appearance. She is normal weight.  HENT:     Head: Normocephalic and atraumatic.     Nose: Nose normal.     Mouth/Throat:     Mouth: Mucous membranes are moist.  Eyes:     Extraocular Movements: Extraocular movements intact.     Conjunctiva/sclera: Conjunctivae normal.     Pupils: Pupils are equal, round, and reactive to light.  Cardiovascular:     Rate and Rhythm: Normal rate and regular rhythm.     Pulses: Normal pulses.     Heart sounds: Normal heart sounds.  Pulmonary:     Effort: Pulmonary effort is normal.     Breath sounds: Normal breath sounds.  Abdominal:     General: Abdomen is flat. Bowel sounds are normal.     Palpations: Abdomen is soft.  Musculoskeletal:        General: Normal range of motion.     Cervical back: Normal range of motion.  Skin:    General: Skin is warm and dry.  Neurological:     General: No focal deficit present.     Mental Status: She is alert and oriented to person, place, and time.  Psychiatric:        Mood and Affect: Mood normal.        Behavior: Behavior normal.        Thought Content: Thought content normal.        Judgment: Judgment normal.      No results found for any visits on 03/12/24.  Recent Results (from the past 2160 hours)  CBC with Differential/Platelet     Status: Abnormal   Collection Time: 02/18/24 12:26 PM  Result Value Ref Range   WBC 6.8 4.0 - 10.5 K/uL   RBC 4.78 3.87 - 5.11 MIL/uL   Hemoglobin 12.0 12.0 - 15.0 g/dL   HCT 59.9 63.9 - 53.9 %   MCV 83.7 80.0 - 100.0 fL   MCH 25.1 (L) 26.0 - 34.0 pg   MCHC 30.0 30.0 - 36.0 g/dL   RDW 84.8  88.4 - 84.4 %   Platelets 347 150 - 400 K/uL   nRBC 0.0 0.0 - 0.2 %   Neutrophils Relative % 62 %   Neutro Abs 4.2 1.7 - 7.7  K/uL   Lymphocytes Relative 32 %   Lymphs Abs 2.2 0.7 - 4.0 K/uL   Monocytes Relative 5 %   Monocytes Absolute 0.4 0.1 - 1.0 K/uL   Eosinophils Relative 1 %   Eosinophils Absolute 0.1 0.0 - 0.5 K/uL   Basophils Relative 0 %   Basophils Absolute 0.0 0.0 - 0.1 K/uL   Immature Granulocytes 0 %   Abs Immature Granulocytes 0.02 0.00 - 0.07 K/uL    Comment: Performed at [facility], [address], [place], [place] [address]  Comprehensive metabolic panel with GFR     Status: Abnormal   Collection Time: 02/18/24 12:26 PM  Result Value Ref Range   Sodium 134 (L) 135 - 145 mmol/L   Potassium 4.1 3.5 - 5.1 mmol/L   Chloride 102 98 - 111 mmol/L   CO2 18 (L) 22 - 32 mmol/L   Glucose, Bld 133 (H) 70 - 99 mg/dL    Comment: Glucose reference range applies only to samples taken after fasting for at least 8 hours.   BUN 10 6 - 20 mg/dL   Creatinine, Ser 9.57 (L) 0.44 - 1.00 mg/dL   Calcium  8.9 8.9 - 10.3 mg/dL   Total Protein 7.1 6.5 - 8.1 g/dL   Albumin 4.0 3.5 - 5.0 g/dL   AST 20 15 - 41 U/L   ALT 14 0 - 44 U/L   Alkaline Phosphatase 66 38 - 126 U/L   Total Bilirubin 0.4 0.0 - 1.2 mg/dL   GFR, Estimated >39 >39 mL/min    Comment: (NOTE) Calculated using the CKD-EPI Creatinine Equation (2021)    Anion gap 15 5 - 15    Comment: Performed at [facility], [address] [place], [place] [address]  Hemoglobin A1c     Status: None   Collection Time: 02/18/24 12:26 PM  Result Value Ref Range   Hgb A1c MFr Bld 5.5 4.8 - 5.6 %    Comment: (NOTE) Diagnosis of Diabetes The following HbA1c ranges recommended by the American Diabetes Association (ADA) may be used as an aid in the diagnosis of diabetes mellitus.  Hemoglobin             Suggested A1C NGSP%              Diagnosis  <5.7                   Non Diabetic  5.7-6.4                 Pre-Diabetic  >6.4                   Diabetic  <7.0                   Glycemic control for                       adults with diabetes.     Mean Plasma Glucose 111.15 mg/dL    Comment: Performed at [facility], [address], [place], [place] [address]  Lipid panel     Status: Abnormal   Collection Time: 02/18/24 12:26 PM  Result Value Ref Range   Cholesterol 144 0 - 200 mg/dL    Comment:        ATP III CLASSIFICATION:  <200     mg/dL   Desirable  799-760  mg/dL   Borderline High  >=759    mg/dL  High           Triglycerides 43 <150 mg/dL   HDL 39 (L) >59 mg/dL   Total CHOL/HDL Ratio 3.7 RATIO   VLDL 9 0 - 40 mg/dL   LDL Cholesterol 96 0 - 99 mg/dL    Comment:        Total Cholesterol/HDL:CHD Risk Coronary Heart Disease Risk Table                     Men   Women  1/2 Average Risk   3.4   3.3  Average Risk       5.0   4.4  2 X Average Risk   9.6   7.1  3 X Average Risk  23.4   11.0        Use the calculated Patient Ratio above and the CHD Risk Table to determine the patient's CHD Risk.        ATP III CLASSIFICATION (LDL):  <100     mg/dL   Optimal  899-870  mg/dL   Near or Above                    Optimal  130-159  mg/dL   Borderline  839-810  mg/dL   High  >809     mg/dL   Very High Performed at [facility], [address], [place], [place] [address]   TSH     Status: None   Collection Time: 02/18/24 12:26 PM  Result Value Ref Range   TSH 1.160 0.350 - 4.500 uIU/mL    Comment: Performed at [facility], [address], [place], [place] [address]      Assessment & Plan:  Continue current medications  Problem List Items Addressed This Visit       Cardiovascular and Mediastinum   Essential hypertension - Primary   Relevant Medications   amLODipine  (NORVASC ) 5 MG tablet   enalapril  (VASOTEC ) 5 MG tablet     Endocrine   Diabetes mellitus (HCC)   Relevant Medications   enalapril  (VASOTEC ) 5 MG  tablet   FARXIGA  10 MG TABS tablet   Poorly controlled type 2 diabetes mellitus with complication (HCC)   Relevant Medications   enalapril  (VASOTEC ) 5 MG tablet   FARXIGA  10 MG TABS tablet     Other   Hyperlipemia   Relevant Medications   amLODipine  (NORVASC ) 5 MG tablet   enalapril  (VASOTEC ) 5 MG tablet    Return in about 4 months (around 07/13/2024) for fasting lab work prior.   Total time spent: 25 minutes. This time includes review of previous notes and results and patient face to face interaction during today's visit.    Jeoffrey Pollen, NP  03/12/2024   This document may have been prepared by Dragon Voice Recognition software and as such may include unintentional dictation errors.

## 2024-03-22 ENCOUNTER — Encounter: Payer: Self-pay | Admitting: Nurse Practitioner

## 2024-03-22 DIAGNOSIS — E1165 Type 2 diabetes mellitus with hyperglycemia: Secondary | ICD-10-CM

## 2024-03-23 MED ORDER — TIRZEPATIDE 7.5 MG/0.5ML ~~LOC~~ SOAJ
7.5000 mg | SUBCUTANEOUS | 0 refills | Status: DC
Start: 2024-03-23 — End: 2024-04-07

## 2024-04-07 ENCOUNTER — Telehealth (INDEPENDENT_AMBULATORY_CARE_PROVIDER_SITE_OTHER): Admitting: Nurse Practitioner

## 2024-04-07 DIAGNOSIS — E1165 Type 2 diabetes mellitus with hyperglycemia: Secondary | ICD-10-CM | POA: Diagnosis not present

## 2024-04-07 DIAGNOSIS — Z6841 Body Mass Index (BMI) 40.0 and over, adult: Secondary | ICD-10-CM | POA: Diagnosis not present

## 2024-04-07 DIAGNOSIS — Z7985 Long-term (current) use of injectable non-insulin antidiabetic drugs: Secondary | ICD-10-CM | POA: Diagnosis not present

## 2024-04-07 DIAGNOSIS — E66813 Obesity, class 3: Secondary | ICD-10-CM | POA: Diagnosis not present

## 2024-04-07 MED ORDER — TIRZEPATIDE 10 MG/0.5ML ~~LOC~~ SOAJ: 10.0000 mg | SUBCUTANEOUS | 0 refills | Status: DC

## 2024-04-07 NOTE — Progress Notes (Signed)
 Office: [phone]  /  Fax: [phone]  WEIGHT SUMMARY AND BIOMETRICS  TeleHealth Visit:  This visit was completed with telemedicine (audio/video) technology. Robin Vazquez has verbally consented to this TeleHealth visit. The patient is located at home, the provider is located at [facility]. The participants in this visit include the listed provider and patient. The visit was conducted today via MyChart video.   HPI  Chief Complaint: OBESITY  Robin Vazquez is here to discuss her progress with her obesity treatment plan. She is on the practicing portion control and making smarter food choices, such as increasing vegetables and decreasing simple carbohydrates and states she is following her eating plan approximately 65 % of the time.   Her starting weight from 05/31/21 was 186 lbs and her current report weight is 179 lbs  Interval History:  Reported weight:  179 lbs  BF:  3 eggs or oatmeal with water and apple juice Snack:  none Lunch:  turkey sandwich or 2 hotdogs with water Snack:  chips Dinner:  lobbyist fry or Chinese or roast and potatoes or carrots with diet snapple and/or water Snack:  none  She is walking in her apartment to stay active  Pharmacotherapy for weight loss: She is not currently taking medications for medical weight loss.     Previous pharmacotherapy for medical weight loss:  Phentermine   Bariatric surgery:  Patient has not had bariatric surgery.    Pharmacotherapy for DMT2:   She is currently taking Farxiga  10mg , Mounjaro  7.5 mg and glipizide  10mg  am and 5 mg in the evening.  Denies side effects.   Last A1c was 5.5 on 02/18/24-Improved CBGs: 91-182 Episodes of hypoglycemia: None She is taking Crestor  20mg  and Enalapril  5 mg.   Last eye exam:  11/23/23  She has tried Metformin and Ozempic  in the past.  -She stopped Metformin due to side effects of diarrhea.   -She stopped Ozempic  due to side effects of vomiting and reflux.    Mounjaro  approved throught  09/16/24   Lab Results  Component Value Date   HGBA1C 5.5 02/18/2024   HGBA1C 7.4 (H) 11/04/2023   HGBA1C 7.4 (H) 07/25/2023   Lab Results  Component Value Date   MICROALBUR 30 07/09/2023   LDLCALC 96 02/18/2024   CREATININE 0.42 (L) 02/18/2024      PHYSICAL EXAM:  There were no vitals taken for this visit. There is no height or weight on file to calculate BMI.  General: She is overweight, cooperative, alert, well developed, and in no acute distress. PSYCH: Has normal mood, affect and thought process.   Extremities: No edema.  Neurologic: No gross sensory or motor deficits. No tremors or fasciculations noted.    DIAGNOSTIC DATA REVIEWED:  BMET    Component Value Date/Time   NA 134 (L) 02/18/2024 1226   K 4.1 02/18/2024 1226   CL 102 02/18/2024 1226   CO2 18 (L) 02/18/2024 1226   GLUCOSE 133 (H) 02/18/2024 1226   BUN 10 02/18/2024 1226   CREATININE 0.42 (L) 02/18/2024 1226   CALCIUM  8.9 02/18/2024 1226   GFRNONAA >60 02/18/2024 1226   GFRAA >60 11/02/2019 1401   Lab Results  Component Value Date   HGBA1C 5.5 02/18/2024   HGBA1C 6.2 (H) 07/28/2015   No results found for: INSULIN  Lab Results  Component Value Date   TSH 1.160 02/18/2024   CBC    Component Value Date/Time   WBC 6.8 02/18/2024 1226   RBC 4.78 02/18/2024 1226   HGB 12.0  02/18/2024 1226   HCT 40.0 02/18/2024 1226   PLT 347 02/18/2024 1226   MCV 83.7 02/18/2024 1226   MCH 25.1 (L) 02/18/2024 1226   MCHC 30.0 02/18/2024 1226   RDW 15.1 02/18/2024 1226   Iron Studies    Component Value Date/Time   IRON 54 07/25/2023 1240   TIBC 532 (H) 07/25/2023 1240   FERRITIN 8 (L) 07/25/2023 1240   IRONPCTSAT 10 (L) 07/25/2023 1240   Lipid Panel     Component Value Date/Time   CHOL 144 02/18/2024 1226   TRIG 43 02/18/2024 1226   HDL 39 (L) 02/18/2024 1226   CHOLHDL 3.7 02/18/2024 1226   VLDL 9 02/18/2024 1226   LDLCALC 96 02/18/2024 1226   Hepatic Function Panel     Component Value  Date/Time   PROT 7.1 02/18/2024 1226   ALBUMIN 4.0 02/18/2024 1226   AST 20 02/18/2024 1226   ALT 14 02/18/2024 1226   ALKPHOS 66 02/18/2024 1226   BILITOT 0.4 02/18/2024 1226      Component Value Date/Time   TSH 1.160 02/18/2024 1226   Nutritional Lab Results  Component Value Date   VD25OH 43.85 04/04/2023   VD25OH See Scanned report in [place] [facility] 07/10/2022   VD25OH 53.97 12/05/2021     ASSESSMENT AND PLAN  TREATMENT PLAN FOR OBESITY:  Recommended Dietary Goals  Robin Vazquez is currently in the action stage of change. As such, her goal is to continue weight management plan. She has agreed to keeping a food journal and adhering to recommended goals of 1200-1300 calories and 80+grams of protein.  Behavioral Intervention  We discussed the following Behavioral Modification Strategies today: increasing lean protein intake to established goals, decreasing simple carbohydrates , increasing vegetables, increasing fiber rich foods, increasing water intake , work on meal planning and preparation, work on tracking and journaling calories using tracking application, reading food labels , keeping healthy foods at home, continue to work on maintaining a reduced calorie state, getting the recommended amount of protein, incorporating whole foods, making healthy choices, staying well hydrated and practicing mindfulness when eating., and increase protein intake, fibrous foods (25 grams per day for women, 30 grams for men) and water to improve satiety and decrease hunger signals. .  Additional resources provided today: NA  Recommended Physical Activity Goals  Robin Vazquez has been advised to work up to 150 minutes of moderate intensity aerobic activity a week and strengthening exercises 2-3 times per week for cardiovascular health, weight loss maintenance and preservation of muscle mass.   She has agreed to Think about enjoyable ways to increase daily physical activity and overcoming barriers to  exercise, Increase physical activity in their day and reduce sedentary time (increase NEAT)., Work on scheduling and tracking physical activity. , and Combine aerobic and strengthening exercises for efficiency and improved cardiometabolic health.    ASSOCIATED CONDITIONS ADDRESSED TODAY  Action/Plan  Type 2 diabetes mellitus with hyperglycemia, without long-term current use of insulin  (HCC) -     Tirzepatide ; Inject 10 mg into the skin once a week.  Dispense: 2 mL; Refill: 0  Decrease Glipizide  to 10mg  daily-she wants to stop her evening dose.  She will send me her BS in one week to review.  At that time, I will continue to decrease her Glipizide  by a 1/2 tablet until she is off of it unless her BS increase/get worse.  She will send me her BS weekly until her next week.  I would like to see if coming off  Glipizide  and increasing Mounjaro  with help with additional weight loss.   Class 3 severe obesity due to excess calories with serious comorbidity and body mass index (BMI) of 40.0 to 44.9 in adult [facility])  Robin Vazquez has lost a total of 7lbs since her initial visit on 05/31/21.  She hasn't been tracking, weighing or measuring her food.  She is not following a meal plan.  We discussed this extensively today that she needs to either start tracking, following a meal plan or make some changes to help her reach her goals.  I will obtain an IC at her next visit and re evaluate her RMR.    Return in about 4 weeks (around 05/05/2024).Robin Vazquez She was informed of the importance of frequent follow up visits to maximize her success with intensive lifestyle modifications for her multiple health conditions.  I personally spent a total of 37 minutes in the care of the patient today including preparing to see the patient, getting/reviewing separately obtained history, counseling and educating, and documenting clinical information in the EHR.    ATTESTASTION STATEMENTS:  Reviewed by clinician on day of visit: allergies,  medications, problem list, medical history, surgical history, family history, social history, and previous encounter notes.     Robin Vazquez. Presli Fanguy FNP-C

## 2024-04-14 ENCOUNTER — Encounter: Payer: Self-pay | Admitting: Nurse Practitioner

## 2024-05-06 ENCOUNTER — Encounter: Payer: Self-pay | Admitting: Nurse Practitioner

## 2024-05-06 ENCOUNTER — Ambulatory Visit: Admitting: Nurse Practitioner

## 2024-05-06 VITALS — BP 113/80 | HR 87 | Temp 98.1°F | Ht <= 58 in | Wt 175.0 lb

## 2024-05-06 DIAGNOSIS — E66813 Obesity, class 3: Secondary | ICD-10-CM | POA: Diagnosis not present

## 2024-05-06 DIAGNOSIS — Z6841 Body Mass Index (BMI) 40.0 and over, adult: Secondary | ICD-10-CM

## 2024-05-06 DIAGNOSIS — Z7984 Long term (current) use of oral hypoglycemic drugs: Secondary | ICD-10-CM | POA: Diagnosis not present

## 2024-05-06 DIAGNOSIS — Z7985 Long-term (current) use of injectable non-insulin antidiabetic drugs: Secondary | ICD-10-CM

## 2024-05-06 DIAGNOSIS — E1165 Type 2 diabetes mellitus with hyperglycemia: Secondary | ICD-10-CM

## 2024-05-06 DIAGNOSIS — R0602 Shortness of breath: Secondary | ICD-10-CM | POA: Diagnosis not present

## 2024-05-06 NOTE — Patient Instructions (Signed)
8

## 2024-05-06 NOTE — Progress Notes (Signed)
 Office: [phone]  /  Fax: [phone]  WEIGHT SUMMARY AND BIOMETRICS  Weight Lost Since Last Visit: 4lb  Weight Gained Since Last Visit: 0lb   Vitals Temp: 98.1 F (36.7 C) BP: 113/80 Pulse Rate: 87 SpO2: 97 %   Anthropometric Measurements Height: 4' 7 (1.397 m) Weight: 175 lb (79.4 kg) BMI (Calculated): 40.67 Weight at Last Visit: 179lb Weight Lost Since Last Visit: 4lb Weight Gained Since Last Visit: 0lb Starting Weight: 186lb Total Weight Loss (lbs): 11 lb (4.99 kg)   No data recorded Other Clinical Data RMR: [id number] Fasting: Yes Labs: No Today's Visit #: 34 Starting Date: 05/31/21     HPI  Chief Complaint: OBESITY  [name] is here to discuss her progress with her obesity treatment plan. She is on the practicing portion control and making smarter food choices, such as increasing vegetables and decreasing simple carbohydrates and states she is following her eating plan approximately 50 % of the time. She states she is exercising 10 minutes 3 days per week.   Interval History:  Since last office visit she has lost 4 pounds. She has been aiming to eat more protein (decreased pork) and vegetables.  She has decreased her carb intake. She has been drinking more water and decreased her sodas to 1-2 times per week.  She stopped juice intake since her last visit.  She is walking to stay active.    Pharmacotherapy for weight loss: She is not currently taking medications for medical weight loss.     Previous pharmacotherapy for medical weight loss:  Phentermine   Bariatric surgery:  Patient has not had bariatric surgery.   Pharmacotherapy for DMT2:   She is currently taking Farxiga  10mg , Mounjaro  10 mg (x 3 doses) and glipizide  10mg  am.  Denies side effects.   Last A1c was 5.5 on 02/18/24-Improved CBGs: 88-151 (notes she has been eating more candy over the past couple of days) Episodes of hypoglycemia: None She is taking Crestor  20mg  and Enalapril  5 mg.    Last eye exam:  11/23/23  She has tried Metformin and Ozempic  in the past.  -She stopped Metformin due to side effects of diarrhea.   -She stopped Ozempic  due to side effects of vomiting and reflux.    Mounjaro  approved throught 09/16/24   Lab Results  Component Value Date   HGBA1C 5.5 02/18/2024   HGBA1C 7.4 (H) 11/04/2023   HGBA1C 7.4 (H) 07/25/2023   Lab Results  Component Value Date   MICROALBUR 30 07/09/2023   LDLCALC 96 02/18/2024   CREATININE 0.42 (L) 02/18/2024    Shortness of breath with exertion Last IC was 1771 on 05/31/21 IC today was 1584-worsening  PHYSICAL EXAM:  Blood pressure 113/80, pulse 87, temperature 98.1 F (36.7 C), height 4' 7 (1.397 m), weight 175 lb (79.4 kg), last menstrual period 04/27/2024, SpO2 97%. Body mass index is 40.67 kg/m.  General: She is overweight, cooperative, alert, well developed, and in no acute distress. PSYCH: Has normal mood, affect and thought process.   Extremities: No edema.  Neurologic: No gross sensory or motor deficits. No tremors or fasciculations noted.    DIAGNOSTIC DATA REVIEWED:  BMET    Component Value Date/Time   NA 134 (L) 02/18/2024 1226   K 4.1 02/18/2024 1226   CL 102 02/18/2024 1226   CO2 18 (L) 02/18/2024 1226   GLUCOSE 133 (H) 02/18/2024 1226   BUN 10 02/18/2024 1226   CREATININE 0.42 (L) 02/18/2024 1226   CALCIUM  8.9 02/18/2024 1226  GFRNONAA >60 02/18/2024 1226   GFRAA >60 11/02/2019 1401   Lab Results  Component Value Date   HGBA1C 5.5 02/18/2024   HGBA1C 6.2 (H) 07/28/2015   No results found for: INSULIN  Lab Results  Component Value Date   TSH 1.160 02/18/2024   CBC    Component Value Date/Time   WBC 6.8 02/18/2024 1226   RBC 4.78 02/18/2024 1226   HGB 12.0 02/18/2024 1226   HCT 40.0 02/18/2024 1226   PLT 347 02/18/2024 1226   MCV 83.7 02/18/2024 1226   MCH 25.1 (L) 02/18/2024 1226   MCHC 30.0 02/18/2024 1226   RDW 15.1 02/18/2024 1226   Iron Studies    Component Value  Date/Time   IRON 54 07/25/2023 1240   TIBC 532 (H) 07/25/2023 1240   FERRITIN 8 (L) 07/25/2023 1240   IRONPCTSAT 10 (L) 07/25/2023 1240   Lipid Panel     Component Value Date/Time   CHOL 144 02/18/2024 1226   TRIG 43 02/18/2024 1226   HDL 39 (L) 02/18/2024 1226   CHOLHDL 3.7 02/18/2024 1226   VLDL 9 02/18/2024 1226   LDLCALC 96 02/18/2024 1226   Hepatic Function Panel     Component Value Date/Time   PROT 7.1 02/18/2024 1226   ALBUMIN 4.0 02/18/2024 1226   AST 20 02/18/2024 1226   ALT 14 02/18/2024 1226   ALKPHOS 66 02/18/2024 1226   BILITOT 0.4 02/18/2024 1226      Component Value Date/Time   TSH 1.160 02/18/2024 1226   Nutritional Lab Results  Component Value Date   VD25OH 43.85 04/04/2023   VD25OH See Scanned report in [place] [facility] 07/10/2022   VD25OH 53.97 12/05/2021     ASSESSMENT AND PLAN  TREATMENT PLAN FOR OBESITY:  Recommended Dietary Goals  Robin Vazquez is currently in the action stage of change. As such, her goal is to continue weight management plan. She has agreed to practicing portion control and making smarter food choices, such as increasing vegetables and decreasing simple carbohydrates.  Behavioral Intervention  We discussed the following Behavioral Modification Strategies today: increasing lean protein intake to established goals, decreasing simple carbohydrates , increasing vegetables, increasing fiber rich foods, increasing water intake , work on meal planning and preparation, reading food labels , keeping healthy foods at home, celebration eating strategies, continue to work on maintaining a reduced calorie state, getting the recommended amount of protein, incorporating whole foods, making healthy choices, staying well hydrated and practicing mindfulness when eating., and increase protein intake, fibrous foods (25 grams per day for women, 30 grams for men) and water to improve satiety and decrease hunger signals. .  Additional resources  provided today: NA  Recommended Physical Activity Goals  [name] has been advised to work up to 150 minutes of moderate intensity aerobic activity a week and strengthening exercises 2-3 times per week for cardiovascular health, weight loss maintenance and preservation of muscle mass.   She has agreed to Think about enjoyable ways to increase daily physical activity and overcoming barriers to exercise, Increase physical activity in their day and reduce sedentary time (increase NEAT)., Continue to gradually increase the amount and intensity of exercise routine, Increase volume of physical activity to a goal of 240 minutes a week, and Combine aerobic and strengthening exercises for efficiency and improved cardiometabolic health.   ASSOCIATED CONDITIONS ADDRESSED TODAY  Action/Plan  Type 2 diabetes mellitus with hyperglycemia, without long-term current use of insulin  (HCC) Her blood sugars have been higher due to eating more candy/sweets.  Will continue Mounjaro  10mg  and glipizide  10mg  daily.  To send me her blood sugar readings next week  SOB (shortness of breath) Worsening Needs to work on dietary changes, increase protein, decrease carbs, increase water intake and exercise.   Class 3 severe obesity due to excess calories with serious comorbidity and body mass index (BMI) of 40.0 to 44.9 in adult [facility])         Return in about 4 weeks (around 06/03/2024).SABRA She was informed of the importance of frequent follow up visits to maximize her success with intensive lifestyle modifications for her multiple health conditions.   ATTESTASTION STATEMENTS:  Reviewed by clinician on day of visit: allergies, medications, problem list, medical history, surgical history, family history, social history, and previous encounter notes.     Corean SAUNDERS. Prabhjot Piscitello FNP-C

## 2024-05-07 MED ORDER — TIRZEPATIDE 10 MG/0.5ML ~~LOC~~ SOAJ: 10.0000 mg | SUBCUTANEOUS | 0 refills | Status: DC

## 2024-05-26 ENCOUNTER — Telehealth: Payer: Self-pay

## 2024-05-26 NOTE — Telephone Encounter (Signed)
 Pt is requesting meds or a cyst/boil. Pt states she has been given it before but didn't state which medication.

## 2024-05-27 ENCOUNTER — Other Ambulatory Visit: Payer: Self-pay | Admitting: Cardiology

## 2024-05-27 MED ORDER — CEPHALEXIN 500 MG PO CAPS: 500.0000 mg | ORAL_CAPSULE | Freq: Two times a day (BID) | ORAL | 0 refills | Status: AC

## 2024-05-27 NOTE — Telephone Encounter (Signed)
 Left message to return call

## 2024-05-27 NOTE — Telephone Encounter (Signed)
 Pt informed

## 2024-06-03 ENCOUNTER — Telehealth: Admitting: Family Medicine

## 2024-06-03 VITALS — Ht <= 58 in | Wt 174.0 lb

## 2024-06-03 DIAGNOSIS — Z7985 Long-term (current) use of injectable non-insulin antidiabetic drugs: Secondary | ICD-10-CM

## 2024-06-03 DIAGNOSIS — Z6841 Body Mass Index (BMI) 40.0 and over, adult: Secondary | ICD-10-CM

## 2024-06-03 DIAGNOSIS — Z9189 Other specified personal risk factors, not elsewhere classified: Secondary | ICD-10-CM

## 2024-06-03 DIAGNOSIS — E1165 Type 2 diabetes mellitus with hyperglycemia: Secondary | ICD-10-CM

## 2024-06-03 DIAGNOSIS — Z7289 Other problems related to lifestyle: Secondary | ICD-10-CM | POA: Diagnosis not present

## 2024-06-03 MED ORDER — TIRZEPATIDE 10 MG/0.5ML ~~LOC~~ SOAJ: 10.0000 mg | SUBCUTANEOUS | 1 refills | Status: AC

## 2024-06-03 NOTE — Progress Notes (Signed)
 "  Office: [phone]  /  Fax: [phone]  WEIGHT SUMMARY AND BIOMETRICS  No data recorded No data recorded  No data recorded  No data recorded No data recorded  I connected with  Robin Vazquez on 06/03/2024 by a video and audio enabled telemedicine application and verified that I am speaking with the correct person using two identifiers.  Patient Location: Home  Provider Location: Office/Clinic  Persons Participating in Visit: Patient.  I discussed the limitations of evaluation and management by telemedicine. The patient expressed understanding and agreed to proceed.   Vital Signs: Because this visit was a virtual/telehealth visit, some criteria may be missing or patient reported. Any vitals not documented were not able to be obtained and vitals that have been documented are patient reported.     HPI  Chief Complaint: OBESITY  [name] is here to discuss her progress with her obesity treatment plan. She is on the practicing portion control and making smarter food choices, such as increasing vegetables and decreasing simple carbohydrates and states she is following her eating plan approximately 70 % of the time. She states she is exercising 15 minutes 3 times per week.  Interval History:  Since last office visit she is down 1 lb (on home scale) This gives her a net weight loss of 12 lb in 2 years of medically supervised weight management  This is a 6.4% TBW loss She is doing well on Mounjaro  10 mg once weekly injection for T2DM, no longer having nausea that she previously had on Ozempic  Denies low blood sugars or meal skipping Able to reduce portion sizes and excess snacks on [employer] food was around over holidays but she stayed mindful She is doing some walking at home (nothing formal) She did cut out evening dose of glipizide  (still on 10 mg in the mornings)  Pharmacotherapy: Mounjaro  10 mg weekly for T2DM  PHYSICAL EXAM:  Height 4' 7 (1.397 m), weight 174 lb  (78.9 kg), last menstrual period 04/27/2024. Body mass index is 40.44 kg/m.  General: She is overweight, cooperative, alert, well developed, and in no acute distress. PSYCH: Has normal mood, affect and thought process.   Lungs: Normal breathing effort, no conversational dyspnea.  ASSESSMENT AND PLAN  TREATMENT PLAN FOR OBESITY:  Recommended Dietary Goals  [name] is currently in the action stage of change. As such, her goal is to continue weight management plan. She has agreed to practicing portion control and making smarter food choices, such as increasing vegetables and decreasing simple carbohydrates. Last REE 1584 Recommended calorie intake for weight loss: 1300-1400 cal/ day  Behavioral Intervention  We discussed the following Behavioral Modification Strategies today: increasing lean protein intake to established goals, increasing fiber rich foods, avoiding skipping meals, increasing water intake , keeping healthy foods at home, identifying sources and decreasing liquid calories, continue to practice mindfulness when eating, and planning for success.  Additional resources provided today: NA  Recommended Physical Activity Goals  [name] has been advised to work up to 150 minutes of moderate intensity aerobic activity a week and strengthening exercises 2-3 times per week for cardiovascular health, weight loss maintenance and preservation of muscle mass.   She has agreed to Think about enjoyable ways to increase daily physical activity and overcoming barriers to exercise and Increase physical activity in their day and reduce sedentary time (increase NEAT). Add in chair exercises from home 20 min / day  Pharmacotherapy changes for the treatment of obesity: none  ASSOCIATED CONDITIONS ADDRESSED TODAY  Type 2 diabetes mellitus with hyperglycemia, without long-term current use of insulin  (HCC) Lab Results  Component Value Date   HGBA1C 5.5 02/18/2024  Well  controlled Improving dietary intake and actively working on weight loss Doing well on Mounjaro  10 mg weekly with adequate satiety Consider reducing glipizide  to 5 mg once daily given good control of diabetes and high risk for hypoglycemia in combination with Mounjaro  Previously intolerant to metformin -     Tirzepatide ; Inject 10 mg into the skin once a week.  Dispense: 2 mL; Refill: 1  Morbid obesity (HCC) Slowly improving Aim for 3-4 lb of weight loss per month with reduced kcal diet and more consistent exercise  BMI 40.0-44.9, adult (HCC)  Sedentary lifestyle Unchanged Disability has limited her ability to participate in formal exercise Consider ref to PREP program since she does qualify  In meantime, begin chair exercises on [employer] 20 min per day  She was informed of the importance of frequent follow up visits to maximize her success with intensive lifestyle modifications for her multiple health conditions.   ATTESTASTION STATEMENTS:  Reviewed by clinician on day of visit: allergies, medications, problem list, medical history, surgical history, family history, social history, and previous encounter notes pertinent to obesity diagnosis.      Darice Haddock, D.O. DABFM, DABOM [facility] [address] [place], [place] [address] [phone]  "

## 2024-07-13 ENCOUNTER — Ambulatory Visit: Admitting: Cardiology

## 2024-07-15 ENCOUNTER — Ambulatory Visit: Admitting: Nurse Practitioner

## 2024-07-23 ENCOUNTER — Ambulatory Visit: Admitting: Cardiology

## 2024-08-17 ENCOUNTER — Ambulatory Visit: Admitting: Certified Nurse Midwife
